# Patient Record
Sex: Female | Born: 1945
Health system: Southern US, Community
[De-identification: ages and names within clinical notes are randomized; demographics above are authoritative.]

## PROBLEM LIST (undated history)

## (undated) DIAGNOSIS — B351 Tinea unguium: Secondary | ICD-10-CM

## (undated) DIAGNOSIS — K635 Polyp of colon: Secondary | ICD-10-CM

## (undated) DIAGNOSIS — K5790 Diverticulosis of intestine, part unspecified, without perforation or abscess without bleeding: Secondary | ICD-10-CM

## (undated) DIAGNOSIS — D649 Anemia, unspecified: Secondary | ICD-10-CM

## (undated) DIAGNOSIS — E785 Hyperlipidemia, unspecified: Secondary | ICD-10-CM

## (undated) DIAGNOSIS — I10 Essential (primary) hypertension: Secondary | ICD-10-CM

## (undated) HISTORY — PX: POLYPECTOMY: SHX149

## (undated) HISTORY — DX: Hyperlipidemia, unspecified: E78.5

## (undated) HISTORY — DX: Tinea unguium: B35.1

## (undated) HISTORY — DX: Anemia, unspecified: D64.9

## (undated) HISTORY — PX: COLONOSCOPY: SHX174

## (undated) HISTORY — DX: Essential (primary) hypertension: I10

## (undated) HISTORY — DX: Polyp of colon: K63.5

## (undated) HISTORY — PX: CARPAL TUNNEL RELEASE: SHX101

## (undated) HISTORY — PX: ABDOMINAL HYSTERECTOMY: SHX81

## (undated) HISTORY — DX: Diverticulosis of intestine, part unspecified, without perforation or abscess without bleeding: K57.90

---

## 2001-01-04 ENCOUNTER — Emergency Department (HOSPITAL_COMMUNITY): Admission: EM | Admit: 2001-01-04 | Discharge: 2001-01-04 | Payer: Self-pay | Admitting: Emergency Medicine

## 2001-01-04 ENCOUNTER — Encounter: Payer: Self-pay | Admitting: Emergency Medicine

## 2002-07-07 ENCOUNTER — Other Ambulatory Visit: Admission: RE | Admit: 2002-07-07 | Discharge: 2002-07-07 | Payer: Self-pay | Admitting: Family Medicine

## 2004-05-30 ENCOUNTER — Encounter: Admission: RE | Admit: 2004-05-30 | Discharge: 2004-05-30 | Payer: Self-pay | Admitting: Internal Medicine

## 2006-07-30 ENCOUNTER — Emergency Department (HOSPITAL_COMMUNITY): Admission: EM | Admit: 2006-07-30 | Discharge: 2006-07-30 | Payer: Self-pay | Admitting: Family Medicine

## 2008-07-04 ENCOUNTER — Other Ambulatory Visit: Admission: RE | Admit: 2008-07-04 | Discharge: 2008-07-04 | Payer: Self-pay | Admitting: Family Medicine

## 2008-07-04 ENCOUNTER — Ambulatory Visit: Payer: Self-pay | Admitting: Family Medicine

## 2008-07-04 LAB — HM PAP SMEAR: HM Pap smear: NEGATIVE

## 2009-02-17 ENCOUNTER — Ambulatory Visit: Payer: Self-pay | Admitting: Family Medicine

## 2009-03-03 ENCOUNTER — Ambulatory Visit: Payer: Self-pay | Admitting: Family Medicine

## 2009-06-08 ENCOUNTER — Ambulatory Visit: Payer: Self-pay | Admitting: Family Medicine

## 2009-06-14 LAB — HM COLONOSCOPY

## 2009-07-06 LAB — HM COLONOSCOPY

## 2009-09-06 ENCOUNTER — Ambulatory Visit: Payer: Self-pay | Admitting: Family Medicine

## 2010-01-08 ENCOUNTER — Ambulatory Visit: Payer: Self-pay | Admitting: Family Medicine

## 2010-01-10 ENCOUNTER — Ambulatory Visit: Payer: Self-pay | Admitting: Family Medicine

## 2010-05-02 ENCOUNTER — Ambulatory Visit: Payer: Self-pay | Admitting: Family Medicine

## 2010-05-20 ENCOUNTER — Emergency Department (HOSPITAL_COMMUNITY)
Admission: EM | Admit: 2010-05-20 | Discharge: 2010-05-20 | Payer: Self-pay | Source: Home / Self Care | Admitting: Emergency Medicine

## 2010-09-04 LAB — HM MAMMOGRAPHY

## 2010-11-12 ENCOUNTER — Encounter: Payer: Self-pay | Admitting: Family Medicine

## 2010-11-27 ENCOUNTER — Encounter: Payer: Self-pay | Admitting: Family Medicine

## 2010-11-28 ENCOUNTER — Ambulatory Visit (INDEPENDENT_AMBULATORY_CARE_PROVIDER_SITE_OTHER): Payer: Medicare Other | Admitting: Family Medicine

## 2010-11-28 ENCOUNTER — Other Ambulatory Visit: Payer: Self-pay | Admitting: *Deleted

## 2010-11-28 ENCOUNTER — Encounter: Payer: Self-pay | Admitting: Family Medicine

## 2010-11-28 DIAGNOSIS — I10 Essential (primary) hypertension: Secondary | ICD-10-CM

## 2010-11-28 DIAGNOSIS — Z23 Encounter for immunization: Secondary | ICD-10-CM

## 2010-11-28 DIAGNOSIS — R5381 Other malaise: Secondary | ICD-10-CM

## 2010-11-28 DIAGNOSIS — E7800 Pure hypercholesterolemia, unspecified: Secondary | ICD-10-CM

## 2010-11-28 DIAGNOSIS — R5383 Other fatigue: Secondary | ICD-10-CM

## 2010-11-28 LAB — COMPREHENSIVE METABOLIC PANEL
ALT: 16 U/L (ref 0–35)
AST: 20 U/L (ref 0–37)
Albumin: 4.3 g/dL (ref 3.5–5.2)
Alkaline Phosphatase: 69 U/L (ref 39–117)
BUN: 24 mg/dL — ABNORMAL HIGH (ref 6–23)
CO2: 27 mEq/L (ref 19–32)
Calcium: 9.6 mg/dL (ref 8.4–10.5)
Chloride: 101 mEq/L (ref 96–112)
Creat: 0.97 mg/dL (ref 0.50–1.10)
Glucose, Bld: 85 mg/dL (ref 70–99)
Potassium: 3.7 mEq/L (ref 3.5–5.3)
Sodium: 140 mEq/L (ref 135–145)
Total Bilirubin: 0.8 mg/dL (ref 0.3–1.2)
Total Protein: 7.1 g/dL (ref 6.0–8.3)

## 2010-11-28 LAB — CBC WITH DIFFERENTIAL/PLATELET
Basophils Absolute: 0 10*3/uL (ref 0.0–0.1)
Basophils Relative: 0 % (ref 0–1)
Eosinophils Absolute: 0 10*3/uL (ref 0.0–0.7)
Eosinophils Relative: 0 % (ref 0–5)
HCT: 37.5 % (ref 36.0–46.0)
Hemoglobin: 12.5 g/dL (ref 12.0–15.0)
Lymphocytes Relative: 36 % (ref 12–46)
Lymphs Abs: 1.9 10*3/uL (ref 0.7–4.0)
MCH: 28.2 pg (ref 26.0–34.0)
MCHC: 33.3 g/dL (ref 30.0–36.0)
MCV: 84.7 fL (ref 78.0–100.0)
Monocytes Absolute: 0.3 10*3/uL (ref 0.1–1.0)
Monocytes Relative: 6 % (ref 3–12)
Neutro Abs: 3.1 10*3/uL (ref 1.7–7.7)
Neutrophils Relative %: 59 % (ref 43–77)
Platelets: 221 10*3/uL (ref 150–400)
RBC: 4.43 MIL/uL (ref 3.87–5.11)
RDW: 15 % (ref 11.5–15.5)
WBC: 5.3 10*3/uL (ref 4.0–10.5)

## 2010-11-28 LAB — TSH: TSH: 1.824 u[IU]/mL (ref 0.350–4.500)

## 2010-11-28 LAB — LIPID PANEL
Cholesterol: 214 mg/dL — ABNORMAL HIGH (ref 0–200)
HDL: 50 mg/dL (ref >39)
LDL Cholesterol: 136 mg/dL — ABNORMAL HIGH (ref 0–99)
Total CHOL/HDL Ratio: 4.3 Ratio
Triglycerides: 142 mg/dL (ref <150)
VLDL: 28 mg/dL (ref 0–40)

## 2010-11-28 MED ORDER — LISINOPRIL-HYDROCHLOROTHIAZIDE 20-12.5 MG PO TABS: 1.0000 | ORAL_TABLET | Freq: Every day | ORAL | Status: DC

## 2010-11-28 MED ORDER — ATENOLOL 25 MG PO TABS: 25.0000 mg | ORAL_TABLET | Freq: Every day | ORAL | Status: DC

## 2010-11-28 MED ORDER — PRAVASTATIN SODIUM 40 MG PO TABS: 40.0000 mg | ORAL_TABLET | Freq: Every day | ORAL | Status: DC

## 2010-11-28 NOTE — Progress Notes (Signed)
Patient presents for a med check and for fasting labs. Hypertension follow-up:  Blood pressures elsewhere are "good"--checked recently once, doesn't recall the number.  Denies dizziness, headaches, chest pain.  Denies side effects of medications. She has been off the atenolol since last week when she ran out.  Hyperlipidemia follow-up:  Patient is reportedly trying to following a low-fat, low cholesterol diet--admits to not following it exactly.  Compliant with medications and denies medication side effects.  Past Medical History  Diagnosis Date  . Hypertension   . Diverticulosis   . Hemorrhoids   . Anemia   . Hyperlipidemia   . Colon polyp     colonoscopy 06/2009, due again 2016    Past Surgical History  Procedure Date  . Abdominal hysterectomy     History   Social History  . Marital Status: Married    Spouse Name: N/A    Number of Children: 4  . Years of Education: N/A   Occupational History  . MACHINE OPERATOR--Retired     Social History Main Topics  . Smoking status: Former Smoker    Quit date: 05/13/2005  . Smokeless tobacco: Not on file  . Alcohol Use: No  . Drug Use: No  . Sexually Active: Not on file   Other Topics Concern  . Not on file   Social History Narrative   Retired 2011.  Lives with husband. Has 4 children    Family History  Problem Relation Age of Onset  . Hypertension Mother   . Cancer Father     colon (60's)  . Cancer Sister     throat  . Hypertension Brother   . Diabetes Neg Hx   . Hypertension Brother     Current outpatient prescriptions:fish oil-omega-3 fatty acids 1000 MG capsule, Take 2 g by mouth daily.  , Disp: , Rfl: ;  lisinopril-hydrochlorothiazide (PRINZIDE,ZESTORETIC) 20-12.5 MG per tablet, Take 1 tablet by mouth daily., Disp: 90 tablet, Rfl: 1;  DISCONTD: lisinopril-hydrochlorothiazide (PRINZIDE,ZESTORETIC) 20-12.5 MG per tablet, Take 1 tablet by mouth daily.  , Disp: , Rfl:  aspirin 81 MG tablet, Take 81 mg by mouth daily.   , Disp: , Rfl: ;  atenolol (TENORMIN) 25 MG tablet, Take 1 tablet (25 mg total) by mouth daily., Disp: 90 tablet, Rfl: 1;  pravastatin (PRAVACHOL) 40 MG tablet, Take 1 tablet (40 mg total) by mouth daily., Disp: 90 tablet, Rfl: 1;  DISCONTD: atenolol (TENORMIN) 25 MG tablet, Take 25 mg by mouth daily.  , Disp: , Rfl:  DISCONTD: pravastatin (PRAVACHOL) 40 MG tablet, Take 40 mg by mouth daily.  , Disp: , Rfl:   No Known Allergies  Per chart, last tetanus 2007.  Never had pneumonia vaccine. Had Shingles vaccine in 2011  ROS:  [facility]  Denies chest pain, palpitations, SOB, cough, fevers, URI symptoms, abdominal pain or blood in stool, no urinary complaints +edema at her ankles at the end of the day only  PHYSICAL EXAM: BP 142/84  Pulse 72  Ht 5' 1.5" (1.562 m)  Wt 199 lb (90.266 kg)  BMI 36.99 kg/m2 Well developed, pleasant female in no distress Neck: no lymphadenopathy or thyromegaly Heart: regular rate and rhythm without murmurs Lungs: clear bilaterally Abdomen: soft, nontender, nondistended, no abdominal bruits, no organomegaly or masses Extremities: no clubbing, cyanosis or edema, normal pulses Skin: no rash Psych: normal mood, affect, hygiene, grooming, speech and eye contact  ASSESSMENT/PLAN: 1. Essential hypertension, benign  Comprehensive metabolic panel, atenolol (TENORMIN) 25 MG  tablet, lisinopril-hydrochlorothiazide (PRINZIDE,ZESTORETIC) 20-12.5 MG per tablet  2. Pure hypercholesterolemia  Comprehensive metabolic panel, Lipid panel, pravastatin (PRAVACHOL) 40 MG tablet  3. Fatigue  Vitamin D 25 hydroxy, CBC with Differential, TSH  4. Need for pneumococcal vaccination  Pneumococcal polysaccharide vaccine 23-valent greater than or equal to 2yo subcutaneous/IM    If lipids not at goal, recommend rechecking in 3 months--patient plans to make additional dietary changes to lower cholesterol, and also she has been off the medication for about a week prior to  todays labs.

## 2010-11-29 ENCOUNTER — Other Ambulatory Visit: Payer: Self-pay | Admitting: *Deleted

## 2010-11-29 ENCOUNTER — Telehealth: Payer: Self-pay | Admitting: *Deleted

## 2010-11-29 DIAGNOSIS — E559 Vitamin D deficiency, unspecified: Secondary | ICD-10-CM

## 2010-11-29 DIAGNOSIS — E7800 Pure hypercholesterolemia, unspecified: Secondary | ICD-10-CM

## 2010-11-29 LAB — VITAMIN D 25 HYDROXY (VIT D DEFICIENCY, FRACTURES): Vit D, 25-Hydroxy: 28 ng/mL — ABNORMAL LOW (ref 30–89)

## 2010-11-29 NOTE — Telephone Encounter (Signed)
Left message for patient to return my call to go over labs. 

## 2011-03-04 ENCOUNTER — Other Ambulatory Visit: Payer: Medicare Other

## 2011-05-20 ENCOUNTER — Other Ambulatory Visit: Payer: Self-pay | Admitting: Family Medicine

## 2011-06-10 ENCOUNTER — Encounter: Payer: Self-pay | Admitting: Internal Medicine

## 2011-06-20 ENCOUNTER — Encounter: Payer: Self-pay | Admitting: Family Medicine

## 2011-06-20 ENCOUNTER — Ambulatory Visit (INDEPENDENT_AMBULATORY_CARE_PROVIDER_SITE_OTHER): Payer: Medicare Other | Admitting: Family Medicine

## 2011-06-20 VITALS — BP 138/88 | HR 76 | Ht 62.0 in | Wt 201.0 lb

## 2011-06-20 DIAGNOSIS — E559 Vitamin D deficiency, unspecified: Secondary | ICD-10-CM

## 2011-06-20 DIAGNOSIS — Z79899 Other long term (current) drug therapy: Secondary | ICD-10-CM

## 2011-06-20 DIAGNOSIS — Z0000 Encounter for general adult medical examination without abnormal findings: Secondary | ICD-10-CM

## 2011-06-20 DIAGNOSIS — G5602 Carpal tunnel syndrome, left upper limb: Secondary | ICD-10-CM

## 2011-06-20 DIAGNOSIS — I10 Essential (primary) hypertension: Secondary | ICD-10-CM

## 2011-06-20 DIAGNOSIS — G5600 Carpal tunnel syndrome, unspecified upper limb: Secondary | ICD-10-CM

## 2011-06-20 DIAGNOSIS — Z23 Encounter for immunization: Secondary | ICD-10-CM

## 2011-06-20 DIAGNOSIS — E7800 Pure hypercholesterolemia, unspecified: Secondary | ICD-10-CM

## 2011-06-20 LAB — POCT URINALYSIS DIPSTICK
Bilirubin, UA: NEGATIVE
Blood, UA: NEGATIVE
Glucose, UA: NEGATIVE
Ketones, UA: NEGATIVE
Leukocytes, UA: NEGATIVE
Nitrite, UA: NEGATIVE
Protein, UA: NEGATIVE
Spec Grav, UA: 1.015
Urobilinogen, UA: NEGATIVE
pH, UA: 5

## 2011-06-20 LAB — HEPATIC FUNCTION PANEL
ALT: 11 U/L (ref 0–35)
AST: 15 U/L (ref 0–37)
Albumin: 4.3 g/dL (ref 3.5–5.2)
Alkaline Phosphatase: 78 U/L (ref 39–117)
Bilirubin, Direct: 0.1 mg/dL (ref 0.0–0.3)
Indirect Bilirubin: 0.3 mg/dL (ref 0.0–0.9)
Total Bilirubin: 0.4 mg/dL (ref 0.3–1.2)
Total Protein: 6.9 g/dL (ref 6.0–8.3)

## 2011-06-20 LAB — LIPID PANEL
Cholesterol: 187 mg/dL (ref 0–200)
HDL: 45 mg/dL (ref >39)
LDL Cholesterol: 117 mg/dL — ABNORMAL HIGH (ref 0–99)
Total CHOL/HDL Ratio: 4.2 Ratio
Triglycerides: 127 mg/dL (ref <150)
VLDL: 25 mg/dL (ref 0–40)

## 2011-06-20 NOTE — Progress Notes (Signed)
Sylvia Gutierrez is a 66 y.o. female who presents for a complete physical.  She has the following concerns: Pt states that she has had some phlegm in her chest that she constantly has to cough up. Cough x 2 months.  Started with a cold, but left with persistent chest congestion and cough.  Phlegm is sometimes clear, sometimes green/dark.  Denies fevers, shortness of breath, chest tightness.  Complains of tingling in hands, L>R.  Had EMG studies done, ordered at 04/2010 visit.  No records/results in chart.  Denies weakness.  Sometimes there is L wrist pain, but wearing the wrist brace helps, recently restarted wearing.  Med check: Hypertension follow-Not checking BP elsewhere. Denies dizziness, headaches, chest pain. Denies side effects of medications.   Hyperlipidemia follow-up: Patient is reportedly trying to following a low-fat, low cholesterol diet. Compliant with medications and denies medication side effects.  She never returned in October for labs (lipids and Vitamin D)  Health Maintenance: Immunization History  Administered Date(s) Administered  . Pneumococcal Polysaccharide 11/28/2010  . Td 05/13/2005  . Zoster 01/10/2010  tetanus was through a previous job.  Unable to get records to know which type of tetanus vaccine she received. 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 Hypertension   . Diverticulosis   . Hemorrhoids   . Anemia   . Hyperlipidemia   . Colon polyp     colonoscopy 06/2009, due again 2016    Past Surgical History  Procedure Date  . Abdominal hysterectomy     for bleeding/benign  . Carpal tunnel release     right    History   Social History  .  Marital Status: Married    Spouse Name: N/A    Number of Children: 4  . Years of Education: N/A   Occupational History  . MACHINE OPERATOR--Retired     Social History Main Topics  . Smoking status: Former Smoker    Quit date: 05/13/2005  . Smokeless tobacco: Never Used  . Alcohol Use: No  . Drug Use: No  . Sexually Active: Not Currently   Other Topics Concern  . Not on file   Social History Narrative   Retired 2011.  Lives with husband. Has 4 children, all in [place].  7 grandchildren, 1 great grandchild    Family History  Problem Relation Age of Onset  . Hypertension Mother   . Cancer Father     colon (60's)  . Cancer Sister     throat  . Hypertension Brother   . Diabetes Neg Hx   . Hypertension Brother     Current outpatient prescriptions:aspirin 81 MG tablet, Take 81 mg by mouth daily.  , Disp: , Rfl: ;  atenolol (TENORMIN) 25 MG tablet, TAKE ONE TABLET BY MOUTH EVERY DAY, Disp: 90 tablet, Rfl: 0;  cholecalciferol (VITAMIN D) 1000 UNITS tablet, Take 1,000 Units by mouth daily., Disp: , Rfl: ;  fish oil-omega-3 fatty acids 1000 MG capsule, Take 2 g by mouth daily.  , Disp: , Rfl:  lisinopril-hydrochlorothiazide (PRINZIDE,ZESTORETIC) 20-12.5 MG per tablet, TAKE ONE TABLET BY MOUTH EVERY DAY, Disp: 90 tablet, Rfl: 0;  pravastatin (PRAVACHOL) 40 MG tablet, TAKE ONE TABLET BY MOUTH  EVERY DAY, Disp: 90 tablet, Rfl: 0  No Known Allergies  ROS: The patient denies anorexia, fever, weight changes, headaches,  vision changes, decreased hearing, ear pain, sore throat, breast concerns, chest pain, palpitations, dizziness, syncope, dyspnea on exertion, swelling, nausea, vomiting, diarrhea, constipation, abdominal pain, melena, hematochezia, indigestion/heartburn, hematuria, incontinence, dysuria, vaginal bleeding, discharge, odor or itch, genital lesions, weakness, tremor, suspicious skin lesions, depression, anxiety, abnormal bleeding/bruising, or enlarged lymph nodes. + L knee pain  x 6 years, has seen ortho and was told she has arthritis. Doesn't take any pain meds, uses topical creams.  PHYSICAL EXAM: BP 138/88  Pulse 76  Ht 5\' 2"  (1.575 m)  Wt 201 lb (91.173 kg)  BMI 36.76 kg/m2 BP 174/90 R arm by MD, but had tickle in throat with frequent cough prior to re-check General Appearance:    Alert, cooperative, no distress, appears stated age  Head:    Normocephalic, without obvious abnormality, atraumatic  Eyes:    PERRL, conjunctiva/corneas clear, EOM's intact, fundi    benign  Ears:    Normal TM's and external ear canals  Nose:   Nares normal, mucosa moderately edematous, no drainage or sinus tenderness  Throat:   Lips, mucosa, and tongue normal; upper dentures  Neck:   Supple, no lymphadenopathy;  thyroid:  no   enlargement/tenderness/nodules; no carotid   bruit or JVD  Back:    Spine nontender, no curvature, ROM normal, no CVA     tenderness  Lungs:     Clear to auscultation bilaterally without wheezes, rales or     ronchi; respirations unlabored  Chest Wall:    No tenderness or deformity   Heart:    Regular rate and rhythm, S1 and S2 normal, no murmur, rub   or gallop  Breast Exam:    No tenderness, masses, or nipple discharge or inversion.      No axillary lymphadenopathy  Abdomen:     Soft, non-tender, nondistended, normoactive bowel sounds,    no masses, no hepatosplenomegaly  Genitalia:    Normal external genitalia without lesions.  BUS and vagina normal; uterus surgical absent.  No adnexal masses or tenderness.  Pap not performed  Rectal:    Normal tone, no masses or tenderness; guaiac negative stool  Extremities:   No clubbing, cyanosis or edema. Thickened onychomycotic great toenails bilaterally, not ingrowing  Pulses:   2+ and symmetric all extremities  Skin:   Skin color, texture, turgor normal, no rashes or lesions  Lymph nodes:   Cervical, supraclavicular, and axillary nodes normal  Neurologic:   CNII-XII intact, normal strength, sensation and gait;  reflexes 2+ and symmetric throughout          Psych:   Normal mood, affect, hygiene and grooming.     ASSESSMENT/PLAN: 1. Routine general medical examination at a health care facility  Visual acuity screening, POCT Urinalysis Dipstick  2. Essential hypertension, benign    3. Pure hypercholesterolemia  Lipid Profile, Hepatic function panel  4. Carpal tunnel syndrome of left wrist    5. Unspecified vitamin D deficiency  Vitamin D (25 hydroxy)  6. Encounter for long-term (current) use of other medications  Hepatic function panel  7. Need for Tdap vaccination  Tdap vaccine greater than or equal to 7yo IM   Knee arthritis--trial of glucosamine and chondroitin. Daily exercise, weight loss encouraged.  HTN--BP elevated today (coughing some, much higher after coughing).  Monitor blood pressure elsewhere, write down.  Follow low sodium diet.  Exercise daily and  try and lose weight.  F/u here in 1-2 months with list of blood pressure.  L carpal tunnel syndrome.  Continue use of wrist brace.  Follow up here if worsening pain, or weakness develops. Will try and get results of EMG from last year.  Cough--since phlegm is mostly clear, ABX aren't needed. Cough is from PND.  Recommend Mucinex (plain or DM) to help with cough.  Follow up if phlegm becomes discolored, fever, worsening cough, shortness of breath.  Discussed monthly self breast exams and yearly mammograms; at least 30 minutes of aerobic activity at least 5 days/week; proper sunscreen use reviewed; healthy diet, including goals of calcium and vitamin D intake and alcohol recommendations (less than or equal to 1 drink/day) reviewed; regular seatbelt use; changing batteries in smoke detectors.  Immunization recommendations discussed--TdaP given today.  Declines flu shot, recommended yearly.  Colonoscopy recommendations reviewed--due again 2016  Schedule dentist and eye exams

## 2011-06-20 NOTE — Patient Instructions (Addendum)
HEALTH MAINTENANCE RECOMMENDATIONS:  It is recommended that you get at least 30 minutes of aerobic exercise at least 5 days/week (for weight loss, you may need as much as 60-90 minutes). This can be any activity that gets your heart rate up. This can be divided in 10-15 minute intervals if needed, but try and build up your endurance at least once a week.  Weight bearing exercise is also recommended twice weekly.  Eat a healthy diet with lots of vegetables, fruits and fiber.  "Colorful" foods have a lot of vitamins (ie green vegetables, tomatoes, red peppers, etc).  Limit sweet tea, regular sodas and alcoholic beverages, all of which has a lot of calories and sugar.  Up to 1 alcoholic drink daily may be beneficial for women (unless trying to lose weight, watch sugars).  Drink a lot of water.  Calcium recommendations are 1200-1500 mg daily (1500 mg for postmenopausal women or women without ovaries), and vitamin D 1000 IU daily.  This should be obtained from diet and/or supplements (vitamins), and calcium should not be taken all at once, but in divided doses.  Monthly self breast exams and yearly mammograms for women over the age of 11 is recommended.  Sunscreen of at least SPF 30 should be used on all sun-exposed parts of the skin when outside between the hours of 10 am and 4 pm (not just when at beach or pool, but even with exercise, golf, tennis, and yard work!)  Use a sunscreen that says "broad spectrum" so it covers both UVA and UVB rays, and make sure to reapply every 1-2 hours.  Remember to change the batteries in your smoke detectors when changing your clock times in the spring and fall.  Use your seat belt every time you are in a car, and please drive safely and not be distracted with cell phones and texting while driving.  Please schedule dental and eye exams. Try glucosamine and chondroitin supplement to help with your knee arthritis pain.  Follow up with orthopedist if worsening pain, if  knee is giving out or locking.  Cough--since phlegm is mostly clear, antibiotics aren't needed. Cough is from postnasal drainage.  I recommend Mucinex (plain or DM) to help with cough.  Follow up if phlegm becomes discolored, fever, worsening cough, shortness of breath.  BP elevated today.  Monitor blood pressure elsewhere, write down.  Follow low sodium diet.  Exercise daily and try and lose weight.  F/u here in 1-2 months with list of blood pressure. Goal BP <130/80 (under 135/85 okay)  2 Gram Low Sodium Diet A 2 gram sodium diet restricts the amount of sodium in the diet to no more than 2 g or 2000 mg daily. Limiting the amount of sodium is often used to help lower blood pressure. It is important if you have heart, liver, or kidney problems. Many foods contain sodium for flavor and sometimes as a preservative. When the amount of sodium in a diet needs to be low, it is important to know what to look for when choosing foods and drinks. The following includes some information and guidelines to help make it easier for you to adapt to a low sodium diet. QUICK TIPS  Do not add salt to food.   Avoid convenience items and fast food.   Choose unsalted snack foods.   Buy lower sodium products, often labeled as "lower sodium" or "no salt added."   Check food labels to learn how much sodium is in 1 serving.  When eating at a restaurant, ask that your food be prepared with less salt or none, if possible.  READING FOOD LABELS FOR SODIUM INFORMATION The nutrition facts label is a good place to find how much sodium is in foods. Look for products with no more than 500 to 600 mg of sodium per meal and no more than 150 mg per serving. Remember that 2 g = 2000 mg. The food label may also list foods as:  Sodium-free: Less than 5 mg in a serving.   Very low sodium: 35 mg or less in a serving.   Low-sodium: 140 mg or less in a serving.   Light in sodium: 50% less sodium in a serving. For example, if a  food that usually has 300 mg of sodium is changed to become light in sodium, it will have 150 mg of sodium.   Reduced sodium: 25% less sodium in a serving. For example, if a food that usually has 400 mg of sodium is changed to reduced sodium, it will have 300 mg of sodium.  CHOOSING FOODS Grains  Avoid: Salted crackers and snack items. Some cereals, including instant hot cereals. Bread stuffing and biscuit mixes. Seasoned rice or pasta mixes.   Choose: Unsalted snack items. Low-sodium cereals, oats, puffed wheat and rice, shredded wheat. English muffins and bread. Pasta.  Meats  Avoid: Salted, canned, smoked, spiced, pickled meats, including fish and poultry. Bacon, ham, sausage, cold cuts, hot dogs, anchovies.   Choose: Low-sodium canned tuna and salmon. Fresh or frozen meat, poultry, and fish.  Dairy  Avoid: Processed cheese and spreads. Cottage cheese. Buttermilk and condensed milk. Regular cheese.   Choose: Milk. Low-sodium cottage cheese. Yogurt. Sour cream. Low-sodium cheese.  Fruits and Vegetables  Avoid: Regular canned vegetables. Regular canned tomato sauce and paste. Frozen vegetables in sauces. Olives. Rosita Fire. Relishes. Sauerkraut.   Choose: Low-sodium canned vegetables. Low-sodium tomato sauce and paste. Frozen or fresh vegetables. Fresh and frozen fruit.  Condiments  Avoid: Canned and packaged gravies. Worcestershire sauce. Tartar sauce. Barbecue sauce. Soy sauce. Steak sauce. Ketchup. Onion, garlic, and table salt. Meat flavorings and tenderizers.   Choose: Fresh and dried herbs and spices. Low-sodium varieties of mustard and ketchup. Lemon juice. Tabasco sauce. Horseradish.  SAMPLE 2 GRAM SODIUM MEAL PLAN Breakfast / Sodium (mg)  1 cup low-fat milk / 143 mg   2 slices whole-wheat toast / 270 mg   1 tbs heart-healthy margarine / 153 mg   1 hard-boiled egg / 139 mg   1 small orange / 0 mg  Lunch / Sodium (mg)  1 cup raw carrots / 76 mg    cup hummus / 298  mg   1 cup low-fat milk / 143 mg    cup red grapes / 2 mg   1 whole-wheat pita bread / 356 mg  Dinner / Sodium (mg)  1 cup whole-wheat pasta / 2 mg   1 cup low-sodium tomato sauce / 73 mg   3 oz lean ground beef / 57 mg   1 small side salad (1 cup raw spinach leaves,  cup cucumber,  cup yellow bell pepper) with 1 tsp olive oil and 1 tsp red wine vinegar / 25 mg  Snack / Sodium (mg)  1 container low-fat vanilla yogurt / 107 mg   3 graham cracker squares / 127 mg  Nutrient Analysis  Calories: 2033   Protein: 77 g   Carbohydrate: 282 g   Fat: 72 g   Sodium: 1971  mg  Document Released: 04/29/2005 Document Revised: 01/09/2011 Document Reviewed: 07/31/2009 [facility] Patient Information 2012 [place], [place].

## 2011-06-21 ENCOUNTER — Encounter: Payer: Self-pay | Admitting: Family Medicine

## 2011-06-21 LAB — VITAMIN D 25 HYDROXY (VIT D DEFICIENCY, FRACTURES): Vit D, 25-Hydroxy: 51 ng/mL (ref 30–89)

## 2011-08-22 ENCOUNTER — Ambulatory Visit (INDEPENDENT_AMBULATORY_CARE_PROVIDER_SITE_OTHER): Payer: Medicare Other | Admitting: Family Medicine

## 2011-08-22 ENCOUNTER — Encounter: Payer: Self-pay | Admitting: Family Medicine

## 2011-08-22 VITALS — BP 126/72 | HR 64 | Ht 62.0 in | Wt 198.0 lb

## 2011-08-22 DIAGNOSIS — E7800 Pure hypercholesterolemia, unspecified: Secondary | ICD-10-CM

## 2011-08-22 DIAGNOSIS — I10 Essential (primary) hypertension: Secondary | ICD-10-CM

## 2011-08-22 MED ORDER — PRAVASTATIN SODIUM 40 MG PO TABS: 40.0000 mg | ORAL_TABLET | Freq: Every day | ORAL | Status: DC

## 2011-08-22 MED ORDER — ATENOLOL 25 MG PO TABS: 25.0000 mg | ORAL_TABLET | Freq: Every day | ORAL | Status: DC

## 2011-08-22 MED ORDER — LISINOPRIL-HYDROCHLOROTHIAZIDE 20-12.5 MG PO TABS: 1.0000 | ORAL_TABLET | Freq: Every day | ORAL | Status: DC

## 2011-08-22 NOTE — Progress Notes (Signed)
Patient presents for follow up on hypertension.  At her recent physical, was noted to have elevated blood pressure.  She also had postnasal drip and had been coughing.  Used some mucinex and the cough resolved.  No further problems with cough.  She increased her exercise, now going to the gym 5 days/week.  Continues to follow low sodium diet.  She hasn't remembered to check her blood pressure elsewhere since the last visit.  Denies headaches, dizziness, palpitations, chest pain.  Hasn't been having any L knee pain since she has been exercising more regularly.  Past Medical History  Diagnosis Date  . Hypertension   . Diverticulosis   . Hemorrhoids   . Anemia   . Hyperlipidemia   . Colon polyp     colonoscopy 06/2009, due again 2016    Past Surgical History  Procedure Date  . Abdominal hysterectomy     for bleeding/benign  . Carpal tunnel release     right    History   Social History  . Marital Status: Married    Spouse Name: N/A    Number of Children: 4  . Years of Education: N/A   Occupational History  . MACHINE OPERATOR--Retired     Social History Main Topics  . Smoking status: Former Smoker    Quit date: 05/13/2005  . Smokeless tobacco: Never Used  . Alcohol Use: No  . Drug Use: No  . Sexually Active: Not Currently   Other Topics Concern  . Not on file   Social History Narrative   Retired 2011.  Lives with husband. Has 4 children, all in [place].  7 grandchildren, 1 great grandchild    Family History  Problem Relation Age of Onset  . Hypertension Mother   . Cancer Father     colon (60's)  . Cancer Sister     throat  . Hypertension Brother   . Diabetes Neg Hx   . Hypertension Brother    Current Outpatient Prescriptions on File Prior to Visit  Medication Sig Dispense Refill  . aspirin 81 MG tablet Take 81 mg by mouth daily.        . cholecalciferol (VITAMIN D) 1000 UNITS tablet Take 1,000 Units by mouth daily.      . fish oil-omega-3 fatty acids 1000  MG capsule Take 2 g by mouth daily.        Marland Kitchen DISCONTD: atenolol (TENORMIN) 25 MG tablet TAKE ONE TABLET BY MOUTH EVERY DAY  90 tablet  0  . DISCONTD: lisinopril-hydrochlorothiazide (PRINZIDE,ZESTORETIC) 20-12.5 MG per tablet TAKE ONE TABLET BY MOUTH EVERY DAY  90 tablet  0  . DISCONTD: pravastatin (PRAVACHOL) 40 MG tablet TAKE ONE TABLET BY MOUTH EVERY DAY  90 tablet  0   No Known Allergies  ROS:  Denies fevers, headaches, dizziness, chest pain, palpitations, swelling in feet, GI complaints, skin rashes or other problems. Denies joint pains  PHYSICAL EXAM: BP 126/72  Pulse 64  Ht 5\' 2"  (1.575 m)  Wt 198 lb (89.812 kg)  BMI 36.21 kg/m2 Well developed, pleasant female in no distress Neck: no lymphadenopathy or mass Heart: regular rate and rhythm without murmur Lungs: clear bilaterally Extremities: no edema Skin: no rash Psych: normal mood, affect  ASSESSMENT/PLAN: 1. Essential hypertension, benign  lisinopril-hydrochlorothiazide (PRINZIDE,ZESTORETIC) 20-12.5 MG per tablet, atenolol (TENORMIN) 25 MG tablet  2. Pure hypercholesterolemia  pravastatin (PRAVACHOL) 40 MG tablet    F/u in September for fasting med check

## 2011-08-22 NOTE — Patient Instructions (Signed)
Your blood pressure is excellent today.  Continue your current medications and regular exercise. Return for a fasting visit in September to re-check your bloodwork and your blood pressure

## 2011-09-05 LAB — HM MAMMOGRAPHY: HM Mammogram: NEGATIVE

## 2011-11-20 ENCOUNTER — Encounter: Payer: Self-pay | Admitting: Gastroenterology

## 2012-01-22 ENCOUNTER — Ambulatory Visit (INDEPENDENT_AMBULATORY_CARE_PROVIDER_SITE_OTHER): Payer: Medicare Other | Admitting: Family Medicine

## 2012-01-22 ENCOUNTER — Encounter: Payer: Self-pay | Admitting: Family Medicine

## 2012-01-22 VITALS — BP 128/78 | HR 68 | Ht 62.0 in | Wt 195.0 lb

## 2012-01-22 DIAGNOSIS — I10 Essential (primary) hypertension: Secondary | ICD-10-CM

## 2012-01-22 DIAGNOSIS — E7800 Pure hypercholesterolemia, unspecified: Secondary | ICD-10-CM

## 2012-01-22 LAB — COMPREHENSIVE METABOLIC PANEL
ALT: 12 U/L (ref 0–35)
AST: 18 U/L (ref 0–37)
Albumin: 4.4 g/dL (ref 3.5–5.2)
Alkaline Phosphatase: 65 U/L (ref 39–117)
BUN: 25 mg/dL — ABNORMAL HIGH (ref 6–23)
CO2: 28 mEq/L (ref 19–32)
Calcium: 9.6 mg/dL (ref 8.4–10.5)
Chloride: 103 mEq/L (ref 96–112)
Creat: 0.97 mg/dL (ref 0.50–1.10)
Glucose, Bld: 99 mg/dL (ref 70–99)
Potassium: 3.9 mEq/L (ref 3.5–5.3)
Sodium: 140 mEq/L (ref 135–145)
Total Bilirubin: 0.7 mg/dL (ref 0.3–1.2)
Total Protein: 7 g/dL (ref 6.0–8.3)

## 2012-01-22 LAB — LIPID PANEL
Cholesterol: 180 mg/dL (ref 0–200)
HDL: 46 mg/dL (ref >39)
LDL Cholesterol: 108 mg/dL — ABNORMAL HIGH (ref 0–99)
Total CHOL/HDL Ratio: 3.9 Ratio
Triglycerides: 131 mg/dL (ref <150)
VLDL: 26 mg/dL (ref 0–40)

## 2012-01-22 MED ORDER — ATENOLOL 25 MG PO TABS: 25.0000 mg | ORAL_TABLET | Freq: Every day | ORAL | Status: DC

## 2012-01-22 MED ORDER — LISINOPRIL-HYDROCHLOROTHIAZIDE 20-12.5 MG PO TABS: 1.0000 | ORAL_TABLET | Freq: Every day | ORAL | Status: DC

## 2012-01-22 MED ORDER — PRAVASTATIN SODIUM 40 MG PO TABS: 40.0000 mg | ORAL_TABLET | Freq: Every day | ORAL | Status: DC

## 2012-01-22 NOTE — Patient Instructions (Signed)
Continue your current medications, unless you hear otherwise from [place] after your results.    Talk to your dentist about lump on lip--not sure if they can remove, vs referring to oral surgeon vs ear/nose/throat doctor.  I recommend having it removed considering that it seems to now be bothering you (after having for over 15 years)

## 2012-01-22 NOTE — Progress Notes (Signed)
Chief Complaint  Patient presents with  . Hypertension    fasting med check. (had a piece of candy on the way over here-ginger & mint hard candy)   HPI:  Hypertension follow-up:  Blood pressures are checked at [facility], but she doesn't write down and doesn't remember numbers, just that they are "always good".  Denies dizziness, headaches, chest pain, feet swelling, muscle cramps.  Denies side effects of medications. Denies cough.  Hyperlipidemia follow-up:  Patient is reportedly following a low-fat, low cholesterol diet. Cooks with butter, puts it on her grits. Uses Eggbeaters. Compliant with medications and denies medication side effects, no myalgias.  Past Medical History  Diagnosis Date  . Hypertension   . Diverticulosis   . Hemorrhoids   . Anemia   . Hyperlipidemia   . Colon polyp     colonoscopy 06/2009, due again 2016   Past Surgical History  Procedure Date  . Abdominal hysterectomy     for bleeding/benign  . Carpal tunnel release     right   History   Social History  . Marital Status: Married    Spouse Name: N/A    Number of Children: 4  . Years of Education: N/A   Occupational History  . MACHINE OPERATOR--Retired     Social History Main Topics  . Smoking status: Former Smoker    Quit date: 05/13/2005  . Smokeless tobacco: Never Used  . Alcohol Use: No  . Drug Use: No  . Sexually Active: Not Currently   Other Topics Concern  . Not on file   Social History Narrative   Retired 2011.  Lives with husband. Has 4 children, all in [place].  7 grandchildren, 1 great grandchild    Current Outpatient Prescriptions on File Prior to Visit  Medication Sig Dispense Refill  . aspirin 81 MG tablet Take 81 mg by mouth daily.        Marland Kitchen atenolol (TENORMIN) 25 MG tablet Take 1 tablet (25 mg total) by mouth daily.  90 tablet  1  . cholecalciferol (VITAMIN D) 1000 UNITS tablet Take 1,000 Units by mouth daily.      . fish oil-omega-3 fatty acids 1000 MG capsule Take 2 g by  mouth daily.        Marland Kitchen lisinopril-hydrochlorothiazide (PRINZIDE,ZESTORETIC) 20-12.5 MG per tablet Take 1 tablet by mouth daily.  90 tablet  1  . pravastatin (PRAVACHOL) 40 MG tablet Take 1 tablet (40 mg total) by mouth daily.  90 tablet  1   No Known Allergies  ROS:  Tingling in hands is much improved.  Recently burned her lip, and has a lump that periodically swells and comes down.  Has had lump x 15 years, just now started swelling more.  Denies headaches, dizziness, chest pain, fevers, URI symptoms, cough, shortness of breath, skin rashes, GI or GU complaints, joint pains, edema, or other concerns.  PHYSICAL EXAM: BP 138/78  Pulse 68  Ht 5\' 2"  (1.575 m)  Wt 195 lb (88.451 kg)  BMI 35.67 kg/m2 128/78 on repeat by MD, right arm Well developed, pleasant female in no distress Lip--wearing lipstick, but has small visible lump on right side of lower lip Neck: no lymphadenopathy, thyromegaly, mass or bruit Heart: regular rate and rhythm without murmur Lungs: clear bilaterally Abdomen: soft, nontender, no organomegaly or mass Extremities: no edema, 2+ pulse Skin no rash Psych: normal mood, affect, hygiene and grooming  ASSESSMENT/PLAN: 1. Essential hypertension, benign  Comprehensive metabolic panel, lisinopril-hydrochlorothiazide (PRINZIDE,ZESTORETIC) 20-12.5 MG per tablet, atenolol (  TENORMIN) 25 MG tablet  2. Pure hypercholesterolemia  Comprehensive metabolic panel, Lipid panel, pravastatin (PRAVACHOL) 40 MG tablet   HTN--controlled, continue current meds.  Hyperlipidemia--labs due  Lip mass--likely a cyst, but consider removal given recent change with swelling.  Recommended she discuss with dentist--likely either ENT or oral surgeon to remove it.  Flu shot recommended, declined by pt.  c-met, lipid today  CPE in 6 months (lipid, liver, vitamin D, TSH

## 2012-01-23 ENCOUNTER — Encounter: Payer: Self-pay | Admitting: Family Medicine

## 2012-03-11 ENCOUNTER — Ambulatory Visit (INDEPENDENT_AMBULATORY_CARE_PROVIDER_SITE_OTHER): Payer: Medicare Other | Admitting: Family Medicine

## 2012-03-11 ENCOUNTER — Encounter: Payer: Self-pay | Admitting: Family Medicine

## 2012-03-11 ENCOUNTER — Institutional Professional Consult (permissible substitution): Payer: Medicare Other | Admitting: Family Medicine

## 2012-03-11 VITALS — BP 130/82 | HR 60 | Ht 62.0 in | Wt 193.0 lb

## 2012-03-11 DIAGNOSIS — R899 Unspecified abnormal finding in specimens from other organs, systems and tissues: Secondary | ICD-10-CM

## 2012-03-11 DIAGNOSIS — R6889 Other general symptoms and signs: Secondary | ICD-10-CM

## 2012-03-11 NOTE — Patient Instructions (Addendum)
We are doing additional blood tests to make sure that you truly do NOT have any syphillis infection.  We will be in touch with your results in a few days.  I do not suspect that you have any active infection, but the blood tests drawn today will confirm.

## 2012-03-11 NOTE — Progress Notes (Signed)
Chief Complaint  Patient presents with  . Advice Only    [facility], pt declines flu shot   HPI: She presents today to discuss letter she received from the [employer].  She donated blood in order to find out what her blood type was (O+), and got a letter indicating possible syphillis.  She presents to discuss the results and have further testing done.  RPR qualitative nonreactive Confirmatory test equivocal Serologic test for syphilis reactive Final interpretation = equivocal.  (tests done were treponema pallidum- partial agglutination (TP-PA), enzyme immunoassay (EIA) for IgG ab's to TP (confirmatory test for TP-PA or RPR reactive specimens; EIA remains positive for life following treated infection) and RPR)  Had "the clap" as a teen.  No STD's since.  Hasn't been sexually active in 9-10 years.  Denies fevers, night sweats, skin rashes, chest pain, joint pains, or any other problems  Past Medical History  Diagnosis Date  . Hypertension   . Diverticulosis   . Hemorrhoids   . Anemia   . Hyperlipidemia   . Colon polyp     colonoscopy 06/2009, due again 2016   Past Surgical History  Procedure Date  . Abdominal hysterectomy     for bleeding/benign  . Carpal tunnel release     right   History   Social History  . Marital Status: Married    Spouse Name: N/A    Number of Children: 4  . Years of Education: N/A   Occupational History  . MACHINE OPERATOR--Retired     Social History Main Topics  . Smoking status: Former Smoker    Quit date: 05/13/2005  . Smokeless tobacco: Never Used  . Alcohol Use: No  . Drug Use: No  . Sexually Active: Not Currently   Other Topics Concern  . Not on file   Social History Narrative   Retired 2011.  Lives with husband. Has 4 children, all in [place].  7 grandchildren, 1 great grandchild    Current Outpatient Prescriptions on File Prior to Visit  Medication Sig Dispense Refill  . aspirin 81 MG tablet Take  81 mg by mouth daily.        Marland Kitchen atenolol (TENORMIN) 25 MG tablet Take 1 tablet (25 mg total) by mouth daily.  90 tablet  1  . cholecalciferol (VITAMIN D) 1000 UNITS tablet Take 1,000 Units by mouth daily.      . fish oil-omega-3 fatty acids 1000 MG capsule Take 2 g by mouth daily.        Marland Kitchen lisinopril-hydrochlorothiazide (PRINZIDE,ZESTORETIC) 20-12.5 MG per tablet Take 1 tablet by mouth daily.  90 tablet  1  . pravastatin (PRAVACHOL) 40 MG tablet Take 1 tablet (40 mg total) by mouth daily.  90 tablet  1   No Known Allergies  ROS:  See HPI   PHYSICAL EXAM: BP 130/82  Pulse 60  Ht 5\' 2"  (1.575 m)  Wt 193 lb (87.544 kg)  BMI 35.30 kg/m2 Well developed, pleasant female, in no distress Office visit limited to discussion of lab results and interpretation, counseling.  ASSESSMENT/PLAN: 1. Abnormal laboratory test  RPR, TPPA Reflex FTA   Recheck labs.  Expect R[facility] to be negative ([facility]).  If FTA+, then has active disease.  Patient will be notified when results are available. Advised that she will never be able to donate blood to [facility]. Told her blood type (O+), which was in her paperwork (which is why she actually went  to donate blood).

## 2012-03-19 ENCOUNTER — Encounter: Payer: Self-pay | Admitting: Family Medicine

## 2012-04-21 ENCOUNTER — Telehealth: Payer: Self-pay | Admitting: *Deleted

## 2012-04-27 NOTE — Telephone Encounter (Signed)
Done

## 2012-05-15 ENCOUNTER — Encounter: Payer: Self-pay | Admitting: Family Medicine

## 2012-07-20 ENCOUNTER — Encounter: Payer: Medicare Other | Admitting: Family Medicine

## 2012-08-10 ENCOUNTER — Other Ambulatory Visit: Payer: Self-pay | Admitting: Family Medicine

## 2012-09-02 ENCOUNTER — Encounter: Payer: Self-pay | Admitting: Family Medicine

## 2012-09-02 ENCOUNTER — Ambulatory Visit (INDEPENDENT_AMBULATORY_CARE_PROVIDER_SITE_OTHER): Payer: Medicare Other | Admitting: Family Medicine

## 2012-09-02 VITALS — BP 118/80 | HR 68 | Ht 62.0 in | Wt 201.0 lb

## 2012-09-02 DIAGNOSIS — S86912A Strain of unspecified muscle(s) and tendon(s) at lower leg level, left leg, initial encounter: Secondary | ICD-10-CM

## 2012-09-02 DIAGNOSIS — Z79899 Other long term (current) drug therapy: Secondary | ICD-10-CM

## 2012-09-02 DIAGNOSIS — IMO0002 Reserved for concepts with insufficient information to code with codable children: Secondary | ICD-10-CM

## 2012-09-02 DIAGNOSIS — E7800 Pure hypercholesterolemia, unspecified: Secondary | ICD-10-CM

## 2012-09-02 DIAGNOSIS — I10 Essential (primary) hypertension: Secondary | ICD-10-CM

## 2012-09-02 DIAGNOSIS — Z1329 Encounter for screening for other suspected endocrine disorder: Secondary | ICD-10-CM

## 2012-09-02 DIAGNOSIS — E559 Vitamin D deficiency, unspecified: Secondary | ICD-10-CM

## 2012-09-02 NOTE — Progress Notes (Signed)
Chief Complaint  Patient presents with  . Follow-up    fasting follow up.   Hypertension: She hasn't been able to check BP elsewhere since machine at [employer] broke.  Denies dizziness, headaches, chest pain, feet swelling, muscle cramps. Denies side effects of medications. Denies cough.  She eats canned foods, but doesn't salt her food.  Hyperlipidemia follow-up: Patient is reportedly following a low-fat, low cholesterol diet. Cooks with butter, puts it on her grits. Uses Eggbeaters. Compliant with medications and denies medication side effects, no myalgias.  She injured her left knee exercising last week.  It swelled up, but has improved.  Hasn't iced it yet, asking if we have ice packs.  Denies any giving way. She is currently not in any pain.  Past Medical History  Diagnosis Date  . Hypertension   . Diverticulosis   . Hemorrhoids   . Anemia   . Hyperlipidemia   . Colon polyp     colonoscopy 06/2009, due again 2016   Past Surgical History  Procedure Laterality Date  . Abdominal hysterectomy      for bleeding/benign  . Carpal tunnel release      right   History   Social History  . Marital Status: Married    Spouse Name: N/A    Number of Children: 4  . Years of Education: N/A   Occupational History  . MACHINE OPERATOR--Retired     Social History Main Topics  . Smoking status: Former Smoker    Quit date: 05/13/2005  . Smokeless tobacco: Never Used  . Alcohol Use: No  . Drug Use: No  . Sexually Active: Not Currently   Other Topics Concern  . Not on file   Social History Narrative   Retired 2011.  Lives with husband. Has 4 children, all in [place].  7 grandchildren, 1 great grandchild   Current Outpatient Prescriptions on File Prior to Visit  Medication Sig Dispense Refill  . aspirin 81 MG tablet Take 81 mg by mouth daily.        Marland Kitchen atenolol (TENORMIN) 25 MG tablet TAKE ONE TABLET BY MOUTH EVERY DAY  90 tablet  0  . cholecalciferol (VITAMIN D) 1000 UNITS tablet  Take 1,000 Units by mouth daily.      . fish oil-omega-3 fatty acids 1000 MG capsule Take 2 g by mouth daily.        Marland Kitchen lisinopril-hydrochlorothiazide (PRINZIDE,ZESTORETIC) 20-12.5 MG per tablet TAKE ONE TABLET BY MOUTH EVERY DAY  90 tablet  0  . pravastatin (PRAVACHOL) 40 MG tablet TAKE ONE TABLET BY MOUTH EVERY DAY  90 tablet  0   No current facility-administered medications on file prior to visit.   No Known Allergies  ROS: Denies fevers, URI symptoms, allergies, headaches, dizziness, cough, shortness of breath, chest pain, edema, skin rashes, bleeding/bruising, GI complaints, urinary complaints. +L knee pain (not currently, see HPI), no other joint pains  PHYSICAL EXAM: BP 142/90  Pulse 68  Ht 5\' 2"  (1.575 m)  Wt 201 lb (91.173 kg)  BMI 36.75 kg/m2 118/80 on repeat by MD, RA Well developed, pleasant female in no distress  Neck: no lymphadenopathy, thyromegaly, mass or bruit  Heart: regular rate and rhythm without murmur  Lungs: clear bilaterally  Abdomen: soft, nontender, no organomegaly or mass  Extremities: no edema, 2+ pulse. Pants wouldn't raise up above knee.  Palpated through clothing--no effusion noted.  Nontender.  FROM without pain.  No joint line tenderness. Negative lachman, McMurray Skin no rash, normal sensation Psych:  normal mood, affect, hygiene and grooming  ASSESSMENT/PLAN: Essential hypertension, benign - well controlled - Plan: Comprehensive metabolic panel  Pure hypercholesterolemia - Plan: Comprehensive metabolic panel, Lipid panel  Unspecified vitamin D deficiency - Plan: Vitamin D 25 hydroxy  Encounter for long-term (current) use of other medications - Plan: Comprehensive metabolic panel  Screening for thyroid disorder - Plan: TSH  Strain of knee and leg, left, initial encounter - date of injury 4/18.  gradually restart exercise.  Ice as needed for recurrent swelling.  Return if increasing pain, ongoing swelling, knee gives out.  Left knee strain,  resolving.  No evidence of internal derangement.  Just had 90 days of meds refilled 2-3 weeks ago.  Pharmacy will contact us for med refills in end of June/July  F/u 6 months CPE

## 2012-09-02 NOTE — Patient Instructions (Addendum)
gradually restart exercise.  Ice as needed for recurrent swelling.  Return if increasing pain, ongoing swelling, knee gives out. Try and exercise at least 30 minutes every day.  Weight loss is recommended.  Low sodium diet is recommended  Sodium-Controlled Diet Sodium is a mineral. It is found in many foods. Sodium may be found naturally or added during the making of a food. The most common form of sodium is salt, which is made up of sodium and chloride. Reducing your sodium intake involves changing your eating habits. The following guidelines will help you reduce the sodium in your diet:  Stop using the salt shaker.  Use salt sparingly in cooking and baking.  Substitute with sodium-free seasonings and spices.  Do not use a salt substitute (potassium chloride) without your caregiver's permission.  Include a variety of fresh, unprocessed foods in your diet.  Limit the use of processed and convenience foods that are high in sodium. USE THE FOLLOWING FOODS SPARINGLY: Breads/Starches  Commercial bread stuffing, commercial pancake or waffle mixes, coating mixes. Waffles. Croutons. Prepared (boxed or frozen) potato, rice, or noodle mixes that contain salt or sodium. Salted [place] fries or hash browns. Salted popcorn, breads, crackers, chips, or snack foods. Vegetables  Vegetables canned with salt or prepared in cream, butter, or cheese sauces. Sauerkraut. Tomato or vegetable juices canned with salt.  Fresh vegetables are allowed if rinsed thoroughly. Fruit  Fruit is okay to eat. Meat and Meat Substitutes  Salted or smoked meats, such as bacon or Canadian bacon, chipped or corned beef, hot dogs, salt pork, luncheon meats, pastrami, ham, or sausage. Canned or smoked fish, poultry, or meat. Processed cheese or cheese spreads, blue or Roquefort cheese. Battered or frozen fish products. Prepared spaghetti sauce. Baked beans. Reuben sandwiches. Salted nuts. Caviar. Milk  Limit buttermilk to  1 cup per week. Soups and Combination Foods  Bouillon cubes, canned or dried soups, broth, consomm. Convenience (frozen or packaged) dinners with more than 600 mg sodium. Pot pies, pizza, Asian food, fast food cheeseburgers, and specialty sandwiches. Desserts and Sweets  Regular (salted) desserts, pie, commercial fruit snack pies, commercial snack cakes, canned puddings.  Eat desserts and sweets in moderation. Fats and Oils  Gravy mixes or canned gravy. No more than 1 to 2 tbs of salad dressing. Chip dips.  Eat fats and oils in moderation. Beverages  See those listed under the vegetables and milk groups. Condiments  Ketchup, mustard, meat sauces, salsa, regular (salted) and lite soy sauce or mustard. Dill pickles, olives, meat tenderizer. Prepared horseradish or pickle relish. Dutch-processed cocoa. Baking powder or baking soda used medicinally. Worcestershire sauce. "Light" salt. Salt substitute, unless approved by your caregiver. Document Released: 10/19/2001 Document Revised: 07/22/2011 Document Reviewed: 05/22/2009 [facility] Patient Information 2013 [place], [place].

## 2012-09-03 ENCOUNTER — Encounter: Payer: Self-pay | Admitting: Family Medicine

## 2012-09-03 LAB — COMPREHENSIVE METABOLIC PANEL
ALT: 12 U/L (ref 0–35)
AST: 19 U/L (ref 0–37)
Albumin: 4.2 g/dL (ref 3.5–5.2)
Alkaline Phosphatase: 64 U/L (ref 39–117)
BUN: 17 mg/dL (ref 6–23)
CO2: 29 mEq/L (ref 19–32)
Calcium: 9.6 mg/dL (ref 8.4–10.5)
Chloride: 102 mEq/L (ref 96–112)
Creat: 0.91 mg/dL (ref 0.50–1.10)
Glucose, Bld: 97 mg/dL (ref 70–99)
Potassium: 3.8 mEq/L (ref 3.5–5.3)
Sodium: 140 mEq/L (ref 135–145)
Total Bilirubin: 0.6 mg/dL (ref 0.3–1.2)
Total Protein: 6.8 g/dL (ref 6.0–8.3)

## 2012-09-03 LAB — LIPID PANEL
Cholesterol: 142 mg/dL (ref 0–200)
HDL: 47 mg/dL (ref >39)
LDL Cholesterol: 72 mg/dL (ref 0–99)
Total CHOL/HDL Ratio: 3 Ratio
Triglycerides: 115 mg/dL (ref <150)
VLDL: 23 mg/dL (ref 0–40)

## 2012-09-03 LAB — VITAMIN D 25 HYDROXY (VIT D DEFICIENCY, FRACTURES): Vit D, 25-Hydroxy: 46 ng/mL (ref 30–89)

## 2012-09-03 LAB — TSH: TSH: 1.805 u[IU]/mL (ref 0.350–4.500)

## 2012-09-07 LAB — HM MAMMOGRAPHY: HM Mammogram: NEGATIVE

## 2012-09-08 ENCOUNTER — Encounter: Payer: Self-pay | Admitting: Internal Medicine

## 2012-11-09 ENCOUNTER — Telehealth: Payer: Self-pay | Admitting: Internal Medicine

## 2012-11-09 MED ORDER — ATENOLOL 25 MG PO TABS: 25.0000 mg | ORAL_TABLET | Freq: Every day | ORAL | Status: DC

## 2012-11-09 MED ORDER — PRAVASTATIN SODIUM 40 MG PO TABS: 40.0000 mg | ORAL_TABLET | Freq: Every day | ORAL | Status: DC

## 2012-11-09 MED ORDER — LISINOPRIL-HYDROCHLOROTHIAZIDE 20-12.5 MG PO TABS: 1.0000 | ORAL_TABLET | Freq: Every day | ORAL | Status: DC

## 2012-11-09 NOTE — Telephone Encounter (Signed)
Refill request for pravastatin,atenolol, lisinopril-hctz to [facility]

## 2012-11-09 NOTE — Telephone Encounter (Signed)
Done

## 2013-03-04 ENCOUNTER — Encounter: Payer: Self-pay | Admitting: Family Medicine

## 2013-03-04 ENCOUNTER — Ambulatory Visit (INDEPENDENT_AMBULATORY_CARE_PROVIDER_SITE_OTHER): Payer: Medicare Other | Admitting: Family Medicine

## 2013-03-04 VITALS — BP 144/78 | HR 60 | Ht 62.0 in | Wt 205.0 lb

## 2013-03-04 DIAGNOSIS — E669 Obesity, unspecified: Secondary | ICD-10-CM

## 2013-03-04 DIAGNOSIS — R229 Localized swelling, mass and lump, unspecified: Secondary | ICD-10-CM

## 2013-03-04 DIAGNOSIS — E7800 Pure hypercholesterolemia, unspecified: Secondary | ICD-10-CM

## 2013-03-04 DIAGNOSIS — Z23 Encounter for immunization: Secondary | ICD-10-CM

## 2013-03-04 DIAGNOSIS — Z0000 Encounter for general adult medical examination without abnormal findings: Secondary | ICD-10-CM

## 2013-03-04 DIAGNOSIS — I10 Essential (primary) hypertension: Secondary | ICD-10-CM

## 2013-03-04 LAB — COMPREHENSIVE METABOLIC PANEL
ALT: 13 U/L (ref 0–35)
AST: 20 U/L (ref 0–37)
Albumin: 4.5 g/dL (ref 3.5–5.2)
Alkaline Phosphatase: 72 U/L (ref 39–117)
BUN: 20 mg/dL (ref 6–23)
CO2: 30 mEq/L (ref 19–32)
Calcium: 9.6 mg/dL (ref 8.4–10.5)
Chloride: 101 mEq/L (ref 96–112)
Creat: 0.99 mg/dL (ref 0.50–1.10)
Glucose, Bld: 96 mg/dL (ref 70–99)
Potassium: 3.9 mEq/L (ref 3.5–5.3)
Sodium: 140 mEq/L (ref 135–145)
Total Bilirubin: 0.5 mg/dL (ref 0.3–1.2)
Total Protein: 7.1 g/dL (ref 6.0–8.3)

## 2013-03-04 LAB — POCT URINALYSIS DIPSTICK
Bilirubin, UA: NEGATIVE
Glucose, UA: NEGATIVE
Ketones, UA: NEGATIVE
Nitrite, UA: NEGATIVE
Protein, UA: NEGATIVE
Spec Grav, UA: 1.015
Urobilinogen, UA: NEGATIVE
pH, UA: 5

## 2013-03-04 LAB — LIPID PANEL
Cholesterol: 167 mg/dL (ref 0–200)
HDL: 50 mg/dL (ref >39)
LDL Cholesterol: 94 mg/dL (ref 0–99)
Total CHOL/HDL Ratio: 3.3 Ratio
Triglycerides: 117 mg/dL (ref <150)
VLDL: 23 mg/dL (ref 0–40)

## 2013-03-04 MED ORDER — ATENOLOL 25 MG PO TABS: 25.0000 mg | ORAL_TABLET | Freq: Every day | ORAL | Status: DC

## 2013-03-04 MED ORDER — PRAVASTATIN SODIUM 40 MG PO TABS: 40.0000 mg | ORAL_TABLET | Freq: Every day | ORAL | Status: DC

## 2013-03-04 MED ORDER — LISINOPRIL-HYDROCHLOROTHIAZIDE 20-12.5 MG PO TABS: 1.0000 | ORAL_TABLET | Freq: Every day | ORAL | Status: DC

## 2013-03-04 NOTE — Progress Notes (Signed)
Chief Complaint  Patient presents with  . Annual Exam    fasting annual exam/me check with pap. UA showed 1+ leuks and trace blood, patient is asymptomatic.  Has a bump on her bottom lip that has been there for a while, would like possible referral to have this looked at. Patient declines flu vaccine.    Sylvia Gutierrez is a 67 y.o. female who presents for a complete physical.  She has the following concerns:  Lump on right lower lip has been present for about 3 years.  Unsure if she had some sort of trauma or burn in the past.  It is not painful.  Sometimes it gets larger, but then gets smaller again.  Never drains or bleeds.  She would like this looked at and possibly removed.  Med check:  Hypertension follow-up: Not checking BP elsewhere. Denies dizziness, headaches, chest pain. Denies side effects of medications. She noticed a change when she switched from [employer] to [place] (felt a little dizzy), but that has resolved. +canned foods and processed meats (salami)  Hyperlipidemia follow-up: Patient is reportedly trying to following a low-fat, low cholesterol diet. Compliant with medications and denies medication side effects.   Immunization History  Administered Date(s) Administered  . Pneumococcal Polysaccharide 11/28/2010  . Td 05/13/2005  . Tdap 06/20/2011  . Zoster 01/10/2010  she has refused flu shots in the past  Last Pap smear: 07/04/08  Last mammogram: 08/2012 Last colonoscopy: 06/14/09  Last DEXA: pt reports having had normal DEXA scan (not in our records, so prior to 2010)  Dentist: within the last 4 months (teeth pulled, and partials made) Ophtho: 01/2012 Exercise: gym 3x/week, cardio x 1 hour (elliptical, uses arms)  Past Medical History  Diagnosis Date  . Hypertension   . Diverticulosis   . Hemorrhoids   . Anemia   . Hyperlipidemia   . Colon polyp     colonoscopy 06/2009, due again 2016    Past Surgical History  Procedure Laterality Date  . Abdominal hysterectomy       for bleeding/benign  . Carpal tunnel release      right    History   Social History  . Marital Status: Married    Spouse Name: N/A    Number of Children: 4  . Years of Education: N/A   Occupational History  . MACHINE OPERATOR--Retired     Social History Main Topics  . Smoking status: Former Smoker    Quit date: 05/13/2005  . Smokeless tobacco: Never Used  . Alcohol Use: No  . Drug Use: No  . Sexual Activity: Not Currently   Other Topics Concern  . Not on file   Social History Narrative   Retired 2011.  Separate from her husband 2011.  Lives alone.  Has 4 children, all in [place].  7 grandchildren, 2 great grandchild    Family History  Problem Relation Age of Onset  . Hypertension Mother   . Cancer Father     colon (60's)  . Colon cancer Father   . Cancer Sister     throat  . Hypertension Brother   . Diabetes Neg Hx   . Breast cancer Neg Hx   . Hypertension Brother   . Hypertension Brother   . Cancer Brother     prostate    Current outpatient prescriptions:aspirin 81 MG tablet, Take 81 mg by mouth daily.  , Disp: , Rfl: ;  atenolol (TENORMIN) 25 MG tablet, Take 1 tablet (25 mg total) by mouth  daily., Disp: 90 tablet, Rfl: 1;  cholecalciferol (VITAMIN D) 1000 UNITS tablet, Take 1,000 Units by mouth daily., Disp: , Rfl: ;  fish oil-omega-3 fatty acids 1000 MG capsule, Take 2 g by mouth daily.  , Disp: , Rfl:  lisinopril-hydrochlorothiazide (PRINZIDE,ZESTORETIC) 20-12.5 MG per tablet, Take 1 tablet by mouth daily., Disp: 90 tablet, Rfl: 1;  pravastatin (PRAVACHOL) 40 MG tablet, Take 1 tablet (40 mg total) by mouth daily., Disp: 90 tablet, Rfl: 1  No Known Allergies  ROS: The patient denies anorexia, fever, headaches, vision changes, decreased hearing, ear pain, sore throat, breast concerns, chest pain, palpitations, dizziness, syncope, dyspnea on exertion, swelling, nausea, vomiting, diarrhea, constipation, abdominal pain, melena, hematochezia,  indigestion/heartburn, hematuria, incontinence, dysuria, vaginal bleeding, discharge, odor or itch, genital lesions, weakness, tremor, suspicious skin lesions, depression, anxiety, abnormal bleeding/bruising, or enlarged lymph nodes.  + L knee pain x 7 years, has seen ortho and was told she has arthritis. Had some pain last week after using the exercise bike. Improving with aspirin and topical cream. +12 pound weight gain since 02/2012  PHYSICAL EXAM: BP 144/78  Pulse 60  Ht 5\' 2"  (1.575 m)  Wt 205 lb (92.987 kg)  BMI 37.49 kg/m2  General Appearance:  Alert, cooperative, no distress, appears stated age   Head:  Normocephalic, without obvious abnormality, atraumatic   Eyes:  PERRL, conjunctiva/corneas clear, EOM's intact, fundi  benign   Ears:  Normal TM's and external ear canals   Nose:  Nares normal, mucosa mildly edematous, no drainage or sinus tenderness   Throat:  Mucosa, and tongue normal;  Dentures; Bluish tinged nodule at right lower lip, nontender.  approx 3mm in size  Neck:  Supple, no lymphadenopathy; thyroid: no enlargement/tenderness/nodules; no carotid  bruit or JVD   Back:  Spine nontender, no curvature, ROM normal, no CVA tenderness   Lungs:  Clear to auscultation bilaterally without wheezes, rales or ronchi; respirations unlabored   Chest Wall:  No tenderness or deformity   Heart:  Regular rate and rhythm, S1 and S2 normal, no murmur, rub  or gallop   Breast Exam:  No tenderness, masses, or nipple discharge or inversion. No axillary lymphadenopathy   Abdomen:  Soft, non-tender, nondistended, normoactive bowel sounds,  no masses, no hepatosplenomegaly   Genitalia:  Normal external genitalia without lesions. BUS and vagina normal; uterus surgically absent. No adnexal masses or tenderness. Pap not performed   Rectal:  Normal tone, no masses or tenderness; guaiac negative stool   Extremities:  No clubbing, cyanosis or edema. Thickened onychomycotic great toenails  bilaterally, not ingrowing   Pulses:  2+ and symmetric all extremities   Skin:  Skin color, texture, turgor normal, no rashes or lesions   Lymph nodes:  Cervical, supraclavicular, and axillary nodes normal   Neurologic:  CNII-XII intact, normal strength, sensation and gait; reflexes 2+ and symmetric throughout          Psych: Normal mood, affect, hygiene and grooming.    ASSESSMENT/PLAN:  Routine general medical examination at a health care facility - Plan: POCT Urinalysis Dipstick, Visual acuity screening  Pure hypercholesterolemia - Plan: Comprehensive metabolic panel, Lipid panel, pravastatin (PRAVACHOL) 40 MG tablet  Essential hypertension, benign - elevated today--monitor elsewhere.  reviewed low sodium diet.  daily exercise and weight loss encouraged - Plan: Comprehensive metabolic panel, lisinopril-hydrochlorothiazide (PRINZIDE,ZESTORETIC) 20-12.5 MG per tablet, atenolol (TENORMIN) 25 MG tablet  Need for prophylactic vaccination and inoculation against influenza - Plan: Flu vaccine HIGH DOSE PF (Fluzone Tri High dose)  Nodule, subcutaneous - lip nodule--appears vascular.  likely gets irritated/inflamed periodically.  desires excision.  refer to ENT - Plan: Ambulatory referral to ENT  Obesity (BMI 30-39.9)  Discussed monthly self breast exams and yearly mammograms; at least 30 minutes of aerobic activity at least 5 days/week; proper sunscreen use reviewed; healthy diet, including goals of calcium and vitamin D intake and alcohol recommendations (less than or equal to 1 drink/day) reviewed; regular seatbelt use; changing batteries in smoke detectors.  Immunization recommendations discussed--flu shot today. Counseled re: benefits and reasons to get, and she was willing to proceed with flu shot after discussion.  Colonoscopy recommendations reviewed--due again 2016.  Hemoccult kit given  Discussed proper seat position for recumbent bike (had been too close, causing increased knee  pain)  Diet and exercise reviewed extensively  Check BP's periodically elsewhere.  Low sodium diet reviewed.

## 2013-03-04 NOTE — Patient Instructions (Signed)
HEALTH MAINTENANCE RECOMMENDATIONS:  It is recommended that you get at least 30 minutes of aerobic exercise at least 5 days/week (for weight loss, you may need as much as 60-90 minutes). This can be any activity that gets your heart rate up. This can be divided in 10-15 minute intervals if needed, but try and build up your endurance at least once a week.  Weight bearing exercise is also recommended twice weekly.  Eat a healthy diet with lots of vegetables, fruits and fiber.  "Colorful" foods have a lot of vitamins (ie green vegetables, tomatoes, red peppers, etc).  Limit sweet tea, regular sodas and alcoholic beverages, all of which has a lot of calories and sugar.  Up to 1 alcoholic drink daily may be beneficial for women (unless trying to lose weight, watch sugars).  Drink a lot of water.  Calcium recommendations are 1200-1500 mg daily (1500 mg for postmenopausal women or women without ovaries), and vitamin D 1000 IU daily.  This should be obtained from diet and/or supplements (vitamins), and calcium should not be taken all at once, but in divided doses.  Monthly self breast exams and yearly mammograms for women over the age of 24 is recommended.  Sunscreen of at least SPF 30 should be used on all sun-exposed parts of the skin when outside between the hours of 10 am and 4 pm (not just when at beach or pool, but even with exercise, golf, tennis, and yard work!)  Use a sunscreen that says "broad spectrum" so it covers both UVA and UVB rays, and make sure to reapply every 1-2 hours.  Remember to change the batteries in your smoke detectors when changing your clock times in the spring and fall.  Use your seat belt every time you are in a car, and please drive safely and not be distracted with cell phones and texting while driving.  Periodically check your blood pressures.  Goal is to be <135/85 (ideally <130/80). If they are consistently >140/90 you need to return sooner than your regular 6 month  appointment, and bring list of blood pressures to the visit.  Cutting back on the salt/sodium in the diet will help keep the blood pressure low, as well as daily exercise and weight loss will help.  Sodium-Controlled Diet Sodium is a mineral. It is found in many foods. Sodium may be found naturally or added during the making of a food. The most common form of sodium is salt, which is made up of sodium and chloride. Reducing your sodium intake involves changing your eating habits. The following guidelines will help you reduce the sodium in your diet:  Stop using the salt shaker.  Use salt sparingly in cooking and baking.  Substitute with sodium-free seasonings and spices.  Do not use a salt substitute (potassium chloride) without your caregiver's permission.  Include a variety of fresh, unprocessed foods in your diet.  Limit the use of processed and convenience foods that are high in sodium. USE THE FOLLOWING FOODS SPARINGLY: Breads/Starches  Commercial bread stuffing, commercial pancake or waffle mixes, coating mixes. Waffles. Croutons. Prepared (boxed or frozen) potato, rice, or noodle mixes that contain salt or sodium. Salted [place] fries or hash browns. Salted popcorn, breads, crackers, chips, or snack foods. Vegetables  Vegetables canned with salt or prepared in cream, butter, or cheese sauces. Sauerkraut. Tomato or vegetable juices canned with salt.  Fresh vegetables are allowed if rinsed thoroughly. Fruit  Fruit is okay to eat. Meat and Meat Substitutes  Salted  or smoked meats, such as bacon or Canadian bacon, chipped or corned beef, hot dogs, salt pork, luncheon meats, pastrami, ham, or sausage. Canned or smoked fish, poultry, or meat. Processed cheese or cheese spreads, blue or Roquefort cheese. Battered or frozen fish products. Prepared spaghetti sauce. Baked beans. Reuben sandwiches. Salted nuts. Caviar. Milk  Limit buttermilk to 1 cup per week. Soups and Combination  Foods  Bouillon cubes, canned or dried soups, broth, consomm. Convenience (frozen or packaged) dinners with more than 600 mg sodium. Pot pies, pizza, Asian food, fast food cheeseburgers, and specialty sandwiches. Desserts and Sweets  Regular (salted) desserts, pie, commercial fruit snack pies, commercial snack cakes, canned puddings.  Eat desserts and sweets in moderation. Fats and Oils  Gravy mixes or canned gravy. No more than 1 to 2 tbs of salad dressing. Chip dips.  Eat fats and oils in moderation. Beverages  See those listed under the vegetables and milk groups. Condiments  Ketchup, mustard, meat sauces, salsa, regular (salted) and lite soy sauce or mustard. Dill pickles, olives, meat tenderizer. Prepared horseradish or pickle relish. Dutch-processed cocoa. Baking powder or baking soda used medicinally. Worcestershire sauce. "Light" salt. Salt substitute, unless approved by your caregiver. Document Released: 10/19/2001 Document Revised: 07/22/2011 Document Reviewed: 05/22/2009 [facility] Patient Information 2014 [place], [place].

## 2013-03-05 ENCOUNTER — Encounter: Payer: Self-pay | Admitting: Family Medicine

## 2013-03-05 DIAGNOSIS — E669 Obesity, unspecified: Secondary | ICD-10-CM | POA: Insufficient documentation

## 2013-03-11 ENCOUNTER — Other Ambulatory Visit (INDEPENDENT_AMBULATORY_CARE_PROVIDER_SITE_OTHER): Payer: Medicare Other

## 2013-03-11 DIAGNOSIS — Z1211 Encounter for screening for malignant neoplasm of colon: Secondary | ICD-10-CM

## 2013-03-11 LAB — HEMOCCULT GUIAC POC 1CARD (OFFICE)
Card #2 Fecal Occult Blod, POC: POSITIVE
Card #3 Fecal Occult Blood, POC: NEGATIVE
Fecal Occult Blood, POC: POSITIVE

## 2013-03-15 ENCOUNTER — Other Ambulatory Visit: Payer: Self-pay | Admitting: *Deleted

## 2013-03-15 DIAGNOSIS — K921 Melena: Secondary | ICD-10-CM

## 2013-03-19 ENCOUNTER — Other Ambulatory Visit: Payer: Medicaid Other

## 2013-03-19 DIAGNOSIS — K921 Melena: Secondary | ICD-10-CM

## 2013-03-19 LAB — CBC WITH DIFFERENTIAL/PLATELET
Basophils Absolute: 0 10*3/uL (ref 0.0–0.1)
Basophils Relative: 0 % (ref 0–1)
Eosinophils Absolute: 0 10*3/uL (ref 0.0–0.7)
Eosinophils Relative: 1 % (ref 0–5)
HCT: 38.2 % (ref 36.0–46.0)
Hemoglobin: 12.9 g/dL (ref 12.0–15.0)
Lymphocytes Relative: 29 % (ref 12–46)
Lymphs Abs: 1.5 10*3/uL (ref 0.7–4.0)
MCH: 28.6 pg (ref 26.0–34.0)
MCHC: 33.8 g/dL (ref 30.0–36.0)
MCV: 84.7 fL (ref 78.0–100.0)
Monocytes Absolute: 0.3 10*3/uL (ref 0.1–1.0)
Monocytes Relative: 6 % (ref 3–12)
Neutro Abs: 3.4 10*3/uL (ref 1.7–7.7)
Neutrophils Relative %: 64 % (ref 43–77)
Platelets: 227 10*3/uL (ref 150–400)
RBC: 4.51 MIL/uL (ref 3.87–5.11)
RDW: 15.3 % (ref 11.5–15.5)
WBC: 5.2 10*3/uL (ref 4.0–10.5)

## 2013-03-19 LAB — IRON: Iron: 94 ug/dL (ref 42–145)

## 2013-03-19 LAB — FERRITIN: Ferritin: 114 ng/mL (ref 10–291)

## 2013-03-25 ENCOUNTER — Telehealth: Payer: Self-pay | Admitting: Family Medicine

## 2013-03-25 ENCOUNTER — Other Ambulatory Visit: Payer: Self-pay | Admitting: *Deleted

## 2013-03-25 DIAGNOSIS — E7800 Pure hypercholesterolemia, unspecified: Secondary | ICD-10-CM

## 2013-03-25 DIAGNOSIS — I10 Essential (primary) hypertension: Secondary | ICD-10-CM

## 2013-03-25 MED ORDER — PRAVASTATIN SODIUM 40 MG PO TABS: 40.0000 mg | ORAL_TABLET | Freq: Every day | ORAL | Status: DC

## 2013-03-25 MED ORDER — ATENOLOL 25 MG PO TABS: 25.0000 mg | ORAL_TABLET | Freq: Every day | ORAL | Status: DC

## 2013-03-25 MED ORDER — LISINOPRIL-HYDROCHLOROTHIAZIDE 20-12.5 MG PO TABS: 1.0000 | ORAL_TABLET | Freq: Every day | ORAL | Status: DC

## 2013-03-25 NOTE — Telephone Encounter (Signed)
Pt is requesting refills on Lisinopril-Hydrochlorothiazide, Pravastatin, and Atenolol sent to [facility].  Please let patient know when done.

## 2013-09-02 ENCOUNTER — Encounter: Payer: Self-pay | Admitting: Family Medicine

## 2013-09-02 ENCOUNTER — Ambulatory Visit (INDEPENDENT_AMBULATORY_CARE_PROVIDER_SITE_OTHER): Payer: 59 | Admitting: Family Medicine

## 2013-09-02 VITALS — BP 122/72 | HR 68 | Ht 62.0 in | Wt 206.0 lb

## 2013-09-02 DIAGNOSIS — E7800 Pure hypercholesterolemia, unspecified: Secondary | ICD-10-CM

## 2013-09-02 DIAGNOSIS — I10 Essential (primary) hypertension: Secondary | ICD-10-CM | POA: Diagnosis not present

## 2013-09-02 DIAGNOSIS — R195 Other fecal abnormalities: Secondary | ICD-10-CM

## 2013-09-02 DIAGNOSIS — E669 Obesity, unspecified: Secondary | ICD-10-CM

## 2013-09-02 DIAGNOSIS — Z79899 Other long term (current) drug therapy: Secondary | ICD-10-CM | POA: Diagnosis not present

## 2013-09-02 LAB — HEPATIC FUNCTION PANEL
ALT: 13 U/L (ref 0–35)
AST: 16 U/L (ref 0–37)
Albumin: 4.1 g/dL (ref 3.5–5.2)
Alkaline Phosphatase: 65 U/L (ref 39–117)
Bilirubin, Direct: 0.1 mg/dL (ref 0.0–0.3)
Indirect Bilirubin: 0.5 mg/dL (ref 0.2–1.2)
Total Bilirubin: 0.6 mg/dL (ref 0.2–1.2)
Total Protein: 6.6 g/dL (ref 6.0–8.3)

## 2013-09-02 LAB — LIPID PANEL
Cholesterol: 160 mg/dL (ref 0–200)
HDL: 51 mg/dL (ref >39)
LDL Cholesterol: 93 mg/dL (ref 0–99)
Total CHOL/HDL Ratio: 3.1 Ratio
Triglycerides: 82 mg/dL (ref <150)
VLDL: 16 mg/dL (ref 0–40)

## 2013-09-02 NOTE — Patient Instructions (Signed)
Continue your current medications.  Try and exercise at least 30 minutes daily as we discussed.  Try eat more fruits and vegetables, cut back on portions, carbs and high calorie/high fat snacks in order to lose some weight.  Please return the stool cards at your convenience.  As discussed, don't take the aspirin prior to collecting, and don't collect when having any kind of bleeding (nose/gums/hemorrhoids, etc).

## 2013-09-02 NOTE — Progress Notes (Signed)
Chief Complaint  Patient presents with  . Hypertension    fasting med check. No concerns.   Hypertension follow-up:  BP's elsewhere are "fair or good"--she doesn't write them down. Denies dizziness, headaches, chest pain. Denies side effects of medications. She stopped buying salami/processed meats. Switched from canned vegetables to frozen.  Hyperlipidemia follow-up: Patient is reportedly trying to following a low-fat, low cholesterol diet. Compliant with medications and denies medication side effects.   She had 2/3 +hemoccult cards back in October.  She denies any bowel changes, abdominal pain or any blood in stools.  We had discussed repeating them in the future.  Denies fatigue, easy bleeding/bruising.  Since last visit she had lip lesion removed; it was benign.  Obesity:  She goes to the gym 2x/week--silver sneakers class and elliptical.  Past Medical History  Diagnosis Date  . Hypertension   . Diverticulosis   . Hemorrhoids   . Anemia   . Hyperlipidemia   . Colon polyp     colonoscopy 06/2009, due again 2016   Past Surgical History  Procedure Laterality Date  . Abdominal hysterectomy      for bleeding/benign  . Carpal tunnel release      right   History   Social History  . Marital Status: Married    Spouse Name: N/A    Number of Children: 4  . Years of Education: N/A   Occupational History  . MACHINE OPERATOR--Retired     Social History Main Topics  . Smoking status: Former Smoker    Quit date: 05/13/2005  . Smokeless tobacco: Never Used  . Alcohol Use: No  . Drug Use: No  . Sexual Activity: Not Currently   Other Topics Concern  . Not on file   Social History Narrative   Retired 2011.  Separate from her husband 2011.  Lives alone.  Has 4 children, all in [place].  7 grandchildren, 2 great grandchild   Outpatient Encounter Prescriptions as of 09/02/2013  Medication Sig  . aspirin 81 MG tablet Take 81 mg by mouth daily.    Marland Kitchen atenolol (TENORMIN) 25 MG  tablet Take 1 tablet (25 mg total) by mouth daily.  . cholecalciferol (VITAMIN D) 1000 UNITS tablet Take 1,000 Units by mouth daily.  . fish oil-omega-3 fatty acids 1000 MG capsule Take 2 g by mouth daily.    Marland Kitchen lisinopril-hydrochlorothiazide (PRINZIDE,ZESTORETIC) 20-12.5 MG per tablet Take 1 tablet by mouth daily.  . pravastatin (PRAVACHOL) 40 MG tablet Take 1 tablet (40 mg total) by mouth daily.   No Known Allergies  ROS:  She denies fevers, chills, fatigue, headaches, dizziness, chest pain, palpitations, shortness of breath, URI symptoms.  She denies any thyroid symptoms; has always been cold-natured--no hair/skin/bowel/mood changes.  See HPI  PHYSICAL EXAM: BP 122/72  Pulse 68  Ht '5\' 2"'  (1.575 m)  Wt 206 lb (93.441 kg)  BMI 37.67 kg/m2 Well developed, pleasant, obese female in no distress Neck: no lymphadenopathy, thyromegaly, mass or bruit  Heart: regular rate and rhythm without murmur  Lungs: clear bilaterally  Abdomen: soft, nontender, no organomegaly or mass  Extremities: no edema, 2+ pulse. Skin no rash, normal sensation  Psych: normal mood, affect, hygiene and grooming  ASSESSMENT/PLAN:  Essential hypertension, benign - controlled  Pure hypercholesterolemia - Plan: Lipid panel, Hepatic function panel  Obesity (BMI 30-39.9) - counseled extensively re: diet, healthy snacks, portions, exercise and weight loss  Encounter for long-term (current) use of other medications - Plan: Hepatic function panel  Heme positive  stool - no associated anemia.  repeat hemoccult cards--proper directions reviewed, kit given   Hemoccult +--repeat kit given with instructions to hold aspirin, and do when not having any bleeding at all (ie nosebleeds, gums bleeding, hemorrhoids, etc).  H/o Vitamin D deficiency--normal values in 08/2012 and 06/2011 since being on vitamin D.  Compliant with supplements.  Continue (no need to recheck).  Plan to recheck TSH with her CPE in October (has been normal,  and no symptoms)  She states she just started her last 90 day bottle. She has switched insurance, and will contact us when she needs refills, and let us know which pharmacy (no longer will be using the same [facility]).   F/u 6 mos for CPE/med check, sooner prn.

## 2013-09-08 LAB — HM MAMMOGRAPHY

## 2013-09-10 ENCOUNTER — Other Ambulatory Visit (INDEPENDENT_AMBULATORY_CARE_PROVIDER_SITE_OTHER): Payer: 59

## 2013-09-10 ENCOUNTER — Encounter: Payer: Self-pay | Admitting: Family Medicine

## 2013-09-10 DIAGNOSIS — Z1211 Encounter for screening for malignant neoplasm of colon: Secondary | ICD-10-CM

## 2013-09-10 LAB — HEMOCCULT GUIAC POC 1CARD (OFFICE)
Card #2 Fecal Occult Blod, POC: NEGATIVE
Card #3 Fecal Occult Blood, POC: NEGATIVE
Fecal Occult Blood, POC: NEGATIVE

## 2013-09-14 ENCOUNTER — Encounter: Payer: Self-pay | Admitting: Internal Medicine

## 2013-09-23 ENCOUNTER — Telehealth: Payer: Self-pay | Admitting: Family Medicine

## 2013-09-23 NOTE — Telephone Encounter (Signed)
Not sure who sent letter.  She is UTD on physical (October) and has had a hysterectomy, so doesn't need a pap

## 2013-09-24 NOTE — Telephone Encounter (Signed)
Left message for pt

## 2013-10-25 ENCOUNTER — Encounter: Payer: Self-pay | Admitting: Family Medicine

## 2013-10-25 ENCOUNTER — Ambulatory Visit (INDEPENDENT_AMBULATORY_CARE_PROVIDER_SITE_OTHER): Payer: 59 | Admitting: Family Medicine

## 2013-10-25 VITALS — BP 122/78 | HR 60 | Ht 62.0 in | Wt 199.0 lb

## 2013-10-25 DIAGNOSIS — B351 Tinea unguium: Secondary | ICD-10-CM | POA: Insufficient documentation

## 2013-10-25 NOTE — Progress Notes (Signed)
Chief Complaint  Patient presents with  . Nail Problem    thinks she may have some toenail fungus on both big toes, left is worse than right.    She has noticed fungus in her toenails for about a year.  She has tried some over-the-counter treatments (cream), thought it worked at first, but realizes that it isn't clearing it up.  Only the big toes are involved.  She denies any pain or ingrown toenails. She is asking about treatment.  Past Medical History  Diagnosis Date  . Hypertension   . Diverticulosis   . Hemorrhoids   . Anemia   . Hyperlipidemia   . Colon polyp     colonoscopy 06/2009, due again 2016   Past Surgical History  Procedure Laterality Date  . Abdominal hysterectomy      for bleeding/benign  . Carpal tunnel release      right   History   Social History  . Marital Status: Married    Spouse Name: N/A    Number of Children: 4  . Years of Education: N/A   Occupational History  . MACHINE OPERATOR--Retired     Social History Main Topics  . Smoking status: Former Smoker    Quit date: 05/13/2005  . Smokeless tobacco: Never Used  . Alcohol Use: No  . Drug Use: No  . Sexual Activity: Not Currently   Other Topics Concern  . Not on file   Social History Narrative   Retired 2011.  Separate from her husband 2011.  Lives alone.  Has 4 children, all in [place].  7 grandchildren, 2 great grandchild   Outpatient Encounter Prescriptions as of 10/25/2013  Medication Sig  . aspirin 81 MG tablet Take 81 mg by mouth daily.    Marland Kitchen atenolol (TENORMIN) 25 MG tablet Take 1 tablet (25 mg total) by mouth daily.  . cholecalciferol (VITAMIN D) 1000 UNITS tablet Take 1,000 Units by mouth daily.  . fish oil-omega-3 fatty acids 1000 MG capsule Take 2 g by mouth daily.    Marland Kitchen lisinopril-hydrochlorothiazide (PRINZIDE,ZESTORETIC) 20-12.5 MG per tablet Take 1 tablet by mouth daily.  . pravastatin (PRAVACHOL) 40 MG tablet Take 1 tablet (40 mg total) by mouth daily.   No Known  Allergies  ROS:  Denies fevers, chills, GI complaints, URI symptoms, swelling, rashes, pain or other concerns.  PHYSICAL EXAM: BP 122/78  Pulse 60  Ht 5\' 2"  (1.575 m)  Wt 199 lb (90.266 kg)  BMI 36.39 kg/m2 Pleasant, obese female in no distress Extremities:  L great toenail is thickened and discolored. R great toenail has distal discoloration (distal 1/2 of the nail), not thickened.  Proximal portion appears entirely normal. She has mild discoloration from recently taking off nail polish on all toes. Other nails are not involved. Normal pulses, sensation  ASSESSMENT/PLAN:  Onychomycosis of toenail - L>R great toes, with no ingrowing nail, infection or pain  Minimally symptomatic, so treatment is cosmetic.  Discussed that meds might not be covered.   Risks, side effects and alternatives were reviewed in detail, as well as recommended monitoring.  Discussed the normal treatment length, and time for nail to appear normal (if gets good response to med).  She decided not to treat at this time, and will let us know if she changes her mind (understanding that we likely would have to get prior auth, and it might not be approved.)  Given her also being on statins, recommend LFT monitoring at 4 and 8 weeks (has normal baseline  from April) if meds are started.

## 2013-10-25 NOTE — Patient Instructions (Signed)

## 2013-10-28 ENCOUNTER — Telehealth: Payer: Self-pay | Admitting: *Deleted

## 2013-10-28 ENCOUNTER — Telehealth: Payer: Self-pay | Admitting: Family Medicine

## 2013-10-28 ENCOUNTER — Other Ambulatory Visit: Payer: Self-pay | Admitting: *Deleted

## 2013-10-28 DIAGNOSIS — Z79899 Other long term (current) drug therapy: Secondary | ICD-10-CM

## 2013-10-28 DIAGNOSIS — B351 Tinea unguium: Secondary | ICD-10-CM

## 2013-10-28 MED ORDER — TERBINAFINE HCL 250 MG PO TABS: 250.0000 mg | ORAL_TABLET | Freq: Every day | ORAL | Status: DC

## 2013-10-28 NOTE — Telephone Encounter (Signed)
Patient called and would like to start oral fungal treatment. She can come in today if you need a liver panel. She uses [facility].

## 2013-10-28 NOTE — Telephone Encounter (Signed)
Patient advised. She will go to or call pharmacy-if no prior Sylvia Gutierrez is needed she will call me back and we will schedule 1 and 2 month liver labs. If prior Sylvia Gutierrez is needed we will talk again once she starts medication and schedule the two lab appts. Pt verbalized understanding.

## 2013-10-28 NOTE — Telephone Encounter (Signed)
I sent rx to pharmacy, but I'm not sure if prior auth will be needed.  She doesn't need labs now, but she will need to schedule LFT's for 1 and 2 months from when she starts the medication (not sure if there will be a delay in her getting the med filled, if prior Josem Kaufmann is needed)

## 2013-10-28 NOTE — Telephone Encounter (Signed)
Pt called to tell you she received her medication.  Pt ph [phone]

## 2013-11-17 ENCOUNTER — Telehealth: Payer: Self-pay | Admitting: Family Medicine

## 2013-11-17 NOTE — Telephone Encounter (Signed)
Left message for patient to return my call.

## 2013-11-17 NOTE — Telephone Encounter (Signed)
Spoke with patient and she wanted to know if you would be willing to send her to a foot doctor?

## 2013-11-17 NOTE — Telephone Encounter (Signed)
We had discussed that treating her fungus was elective, and she did NOT have to treat it.  The medication needs to be taken for 3 months, so if she isn't tolerating (or sporadic in use), might not get cure/good results.  She can keep trying for the next few days, and if consistently having problems/side effect, then stop the med.  She can always come for OV for eval for concern as well

## 2013-11-17 NOTE — Telephone Encounter (Signed)
Ok to refer to [facility], if she desires

## 2013-11-17 NOTE — Telephone Encounter (Signed)
Pt states the Terbinafine that you gave her for her feet is causing her feet to swell & itch.  She didn't take it yesterday and they didn't swell or itch.  What should she do?

## 2013-11-18 NOTE — Telephone Encounter (Signed)
Patient referred to [facility], will be seeing Dr.Egerton 12/10/13 @ 10:30am. Patient aware.

## 2013-11-24 ENCOUNTER — Other Ambulatory Visit: Payer: 59

## 2013-11-24 DIAGNOSIS — Z79899 Other long term (current) drug therapy: Secondary | ICD-10-CM

## 2013-11-25 ENCOUNTER — Other Ambulatory Visit: Payer: Self-pay

## 2013-12-10 ENCOUNTER — Telehealth: Payer: Self-pay | Admitting: *Deleted

## 2013-12-10 ENCOUNTER — Encounter: Payer: Self-pay | Admitting: Podiatrist

## 2013-12-10 ENCOUNTER — Ambulatory Visit (INDEPENDENT_AMBULATORY_CARE_PROVIDER_SITE_OTHER): Payer: 59 | Admitting: Podiatrist

## 2013-12-10 VITALS — BP 140/73 | HR 52 | Resp 18

## 2013-12-10 DIAGNOSIS — B351 Tinea unguium: Secondary | ICD-10-CM

## 2013-12-10 MED ORDER — TAVABOROLE 5 % EX SOLN: 1.0000 [drp] | CUTANEOUS | Status: DC

## 2013-12-10 NOTE — Progress Notes (Signed)
   Subjective:    Patient ID: Sylvia Gutierrez, female    DOB: [birth year], 68 y.o.   MRN: [id number]  HPI  I HAVE SOME FUNGAL TOENAILS AND HAS BEEN GOING ON FOR ABOUT 3 YEARS AND THICK AND DISCOLORED AND HAVE NOT TRIED ANYTHING OVER THE COUNTER    Review of Systems  All other systems reviewed and are negative.      Objective:   Physical Exam Patient is awake, alert, and oriented x 3.  In no acute distress.  Vascular status is intact with palpable pedal pulses at 2/4 DP and PT bilateral and capillary refill time within normal limits. Neurological sensation is also intact bilaterally via Semmes Weinstein monofilament at 5/5 sites. Light touch, vibratory sensation, Achilles tendon reflex is intact. Dermatological exam reveals skin color, turger and texture as normal. No open lesions present.  Musculature intact with dorsiflexion, plantarflexion, inversion, eversion.  Left hallux nail is mycotic and dystrophic.  Remainder of nails are ok.     Assessment & Plan:  Mycotic nail  Plan:  lamisil she had a reaction with and could not tolerate.  i will start her on kerydin.  i will take a sample of the nail and send to [facility]-- may be a candidate for diflucan depending upon the culture results..  Will notifiy her of the culutre results

## 2013-12-10 NOTE — Telephone Encounter (Signed)
Left 1st toenail fragment sent to [facility] [phone] for definitive diagnosis for fungal element.

## 2013-12-10 NOTE — Patient Instructions (Signed)

## 2013-12-27 ENCOUNTER — Other Ambulatory Visit: Payer: Self-pay

## 2014-01-21 ENCOUNTER — Encounter: Payer: Self-pay | Admitting: Podiatrist

## 2014-02-14 ENCOUNTER — Ambulatory Visit
Admission: RE | Admit: 2014-02-14 | Discharge: 2014-02-14 | Disposition: A | Discharge status: Home / Self Care | Payer: PRIVATE HEALTH INSURANCE | Source: Ambulatory Visit | Attending: Family Medicine | Admitting: Family Medicine

## 2014-02-14 ENCOUNTER — Encounter: Payer: Self-pay | Admitting: Family Medicine

## 2014-02-14 ENCOUNTER — Ambulatory Visit (INDEPENDENT_AMBULATORY_CARE_PROVIDER_SITE_OTHER): Payer: 59 | Admitting: Family Medicine

## 2014-02-14 VITALS — BP 130/70 | HR 72 | Ht 62.0 in | Wt 192.0 lb

## 2014-02-14 DIAGNOSIS — I10 Essential (primary) hypertension: Secondary | ICD-10-CM

## 2014-02-14 DIAGNOSIS — E7800 Pure hypercholesterolemia, unspecified: Secondary | ICD-10-CM

## 2014-02-14 DIAGNOSIS — Z0000 Encounter for general adult medical examination without abnormal findings: Secondary | ICD-10-CM

## 2014-02-14 DIAGNOSIS — Z23 Encounter for immunization: Secondary | ICD-10-CM

## 2014-02-14 DIAGNOSIS — E669 Obesity, unspecified: Secondary | ICD-10-CM

## 2014-02-14 DIAGNOSIS — Z8249 Family history of ischemic heart disease and other diseases of the circulatory system: Secondary | ICD-10-CM

## 2014-02-14 DIAGNOSIS — B351 Tinea unguium: Secondary | ICD-10-CM

## 2014-02-14 DIAGNOSIS — R51 Headache: Secondary | ICD-10-CM

## 2014-02-14 DIAGNOSIS — M25562 Pain in left knee: Secondary | ICD-10-CM

## 2014-02-14 DIAGNOSIS — R519 Headache, unspecified: Secondary | ICD-10-CM

## 2014-02-14 LAB — CBC WITH DIFFERENTIAL/PLATELET
Basophils Absolute: 0 10*3/uL (ref 0.0–0.1)
Basophils Relative: 0 % (ref 0–1)
Eosinophils Absolute: 0.1 10*3/uL (ref 0.0–0.7)
Eosinophils Relative: 1 % (ref 0–5)
HCT: 38.2 % (ref 36.0–46.0)
Hemoglobin: 12.6 g/dL (ref 12.0–15.0)
Lymphocytes Relative: 33 % (ref 12–46)
Lymphs Abs: 2.2 10*3/uL (ref 0.7–4.0)
MCH: 28.6 pg (ref 26.0–34.0)
MCHC: 33 g/dL (ref 30.0–36.0)
MCV: 86.8 fL (ref 78.0–100.0)
Monocytes Absolute: 0.4 10*3/uL (ref 0.1–1.0)
Monocytes Relative: 6 % (ref 3–12)
Neutro Abs: 4.1 10*3/uL (ref 1.7–7.7)
Neutrophils Relative %: 60 % (ref 43–77)
Platelets: 235 10*3/uL (ref 150–400)
RBC: 4.4 MIL/uL (ref 3.87–5.11)
RDW: 15.3 % (ref 11.5–15.5)
WBC: 6.8 10*3/uL (ref 4.0–10.5)

## 2014-02-14 LAB — LIPID PANEL
Cholesterol: 176 mg/dL (ref 0–200)
HDL: 47 mg/dL (ref >39)
LDL Cholesterol: 104 mg/dL — ABNORMAL HIGH (ref 0–99)
Total CHOL/HDL Ratio: 3.7 Ratio
Triglycerides: 125 mg/dL (ref <150)
VLDL: 25 mg/dL (ref 0–40)

## 2014-02-14 LAB — POCT URINALYSIS DIPSTICK
Bilirubin, UA: NEGATIVE
Glucose, UA: NEGATIVE
Ketones, UA: NEGATIVE
Nitrite, UA: NEGATIVE
Protein, UA: NEGATIVE
Spec Grav, UA: 1.015
Urobilinogen, UA: NEGATIVE
pH, UA: 5

## 2014-02-14 LAB — COMPREHENSIVE METABOLIC PANEL
ALT: 11 U/L (ref 0–35)
AST: 17 U/L (ref 0–37)
Albumin: 4.2 g/dL (ref 3.5–5.2)
Alkaline Phosphatase: 70 U/L (ref 39–117)
BUN: 19 mg/dL (ref 6–23)
CO2: 32 mEq/L (ref 19–32)
Calcium: 9.7 mg/dL (ref 8.4–10.5)
Chloride: 101 mEq/L (ref 96–112)
Creat: 0.96 mg/dL (ref 0.50–1.10)
Glucose, Bld: 96 mg/dL (ref 70–99)
Potassium: 3.8 mEq/L (ref 3.5–5.3)
Sodium: 141 mEq/L (ref 135–145)
Total Bilirubin: 0.6 mg/dL (ref 0.2–1.2)
Total Protein: 7 g/dL (ref 6.0–8.3)

## 2014-02-14 LAB — TSH: TSH: 2.283 u[IU]/mL (ref 0.350–4.500)

## 2014-02-14 NOTE — Progress Notes (Signed)
Chief Complaint  Patient presents with  . Annual Exam    fasting annual exam with pelvic. UA showed 2+ leuks and trace blood. She does state that the right side of her head has been "thumping," and she thinks she needs an MRI. Also left knee has been locking up and she thinks she should have a cortisone injection. Pt decllined flu vaccine today.    Sylvia Gutierrez is a 68 y.o. female who presents for a complete physical and 6 month f/u on her chronic problems/med check. Her concerns today include:  She wants an MRI of her brain.  She had thumping headache on the right side of her head yesterday. Pain was at her right temple.  She has been having headaches at right temple off and on for a few years--had been mild, but has gotten worse.  She wants one mainly due to her mother's h/o aneurysm.  Left knee pain.  She is wanting a cortisone shot.  Pain is constant--whether she is active or not.  She has more pain with doing the elliptical. No pain when on the exercise bike.  She finds that it has just starting locking up on her when walking.  Denies any knee swelling.  Hypertension follow-up: BP's haven't been checked elsewhere very often--just rare.  "Perfect" when nurse came to her house (?August).  Denies dizziness, headaches, chest pain. Denies side effects of medications. She has some ankle swelling, which gets worse throughout the day (R>L).  H/o ankle injury (broken bone) in past.  She continues to try and follow a low sodium diet (no processed foods; frozen vegetables instead of canned).  Hyperlipidemia follow-up: Patient is reportedly trying to following a low-fat, low cholesterol diet. Compliant with medications and denies medication side effects.   She denies any urinary symptoms (some abnormalities noted on urine dip today).  Immunization History  Administered Date(s) Administered  . Influenza, High Dose Seasonal PF 03/04/2013  . PPD Test 09/01/2013  . Pneumococcal Polysaccharide-23 11/28/2010   . Td 05/13/2005  . Tdap 06/20/2011  . Zoster 01/10/2010   Last Pap smear: 07/04/08  Last mammogram: 08/2013  Last colonoscopy: 06/14/09 (due again 06/2014) Last DEXA: pt reports having had normal DEXA scan (not in our records, so prior to 2010)--she believes she has paperwork at home. Dentist: Over a year ago (when she had teeth pulled, and partials made).  She has 5 teeth left.  Ophtho: 01/2013, due now Exercise: gym 3x/week, silver sneaker class and cardio x 1 hour (elliptical, uses arms).  Walk on the days she doesn't go to the gym (1 mile, 2x/week) She does not have Living Will or Healthcare Power of Attorney  Past Medical History  Diagnosis Date  . Hypertension   . Diverticulosis   . Hemorrhoids   . Anemia   . Hyperlipidemia   . Colon polyp     colonoscopy 06/2009, due again 2016  . Onychomycosis     Past Surgical History  Procedure Laterality Date  . Abdominal hysterectomy      for bleeding/benign  . Carpal tunnel release Right     History   Social History  . Marital Status: Married    Spouse Name: N/A    Number of Children: 4  . Years of Education: N/A   Occupational History  . MACHINE OPERATOR--Retired     Social History Main Topics  . Smoking status: Former Smoker    Quit date: 05/13/2005  . Smokeless tobacco: Never Used  . Alcohol Use: No  .   Drug Use: No  . Sexual Activity: Not Currently   Other Topics Concern  . Not on file   Social History Narrative   Retired 2011.  Separated from her husband 2011, now divorced.  Lives alone.  Has 4 children, all in [place].  7 grandchildren, 2 great grandchildren    Family History  Problem Relation Age of Onset  . Hypertension Mother   . Cancer Father     colon (78's)  . Colon cancer Father   . Cancer Sister     throat  . Hypertension Brother   . Diabetes Brother   . Breast cancer Neg Hx   . Heart disease Neg Hx   . Hypertension Brother   . Hypertension Brother   . Cancer Brother     prostate    Outpatient Encounter Prescriptions as of 02/14/2014  Medication Sig Note  . aspirin 81 MG tablet Take 81 mg by mouth daily.     Marland Kitchen atenolol (TENORMIN) 25 MG tablet Take 1 tablet (25 mg total) by mouth daily.   . cholecalciferol (VITAMIN D) 1000 UNITS tablet Take 1,000 Units by mouth daily.   . fish oil-omega-3 fatty acids 1000 MG capsule Take 2 g by mouth daily.     Marland Kitchen lisinopril-hydrochlorothiazide (PRINZIDE,ZESTORETIC) 20-12.5 MG per tablet Take 1 tablet by mouth daily.   . pravastatin (PRAVACHOL) 40 MG tablet Take 1 tablet (40 mg total) by mouth daily.   . Tavaborole (KERYDIN) 5 % SOLN Apply 1 drop topically 1 day or 1 dose. Apply 1 drop to the toenail daily.   . [DISCONTINUED] terbinafine (LAMISIL) 250 MG tablet Take 1 tablet (250 mg total) by mouth daily. 02/14/2014: Stopped due to side effects (only took a few days)     No Known Allergies  ROS: The patient denies fever, vision changes, decreased hearing, ear pain, sore throat, breast concerns, chest pain, palpitations, dizziness, syncope, dyspnea on exertion, nausea, vomiting, diarrhea, constipation, abdominal pain, melena, hematochezia, indigestion/heartburn, hematuria, incontinence, dysuria, vaginal bleeding, discharge, odor or itch, genital lesions, weakness, tremor, suspicious skin lesions, depression, anxiety, abnormal bleeding/bruising, or enlarged lymph nodes.  + L knee pain per HPI. She lost 14 pounds in the last 6 months (reportedly due to decreased appetite over the summer compared to the winter).  PHYSICAL EXAM:  BP 148/80  Pulse 72  Ht 5' 2" (1.575 m)  Wt 192 lb (87.091 kg)  BMI 35.11 kg/m2 130/70 on repeat by MD  General Appearance:  Alert, cooperative, no distress, appears stated age   Head:  Normocephalic, without obvious abnormality, atraumatic.  nontender at temporal arteries, temporalis muscle (no headache at present)  Eyes:  PERRL, conjunctiva/corneas clear, EOM's intact, fundi  benign   Ears:  Normal TM's and  external ear canals   Nose:  Nares normal, mucosa mildly edematous, no drainage or sinus tenderness   Throat:  Mucosa, and tongue normal; Dentures   Neck:  Supple, no lymphadenopathy; thyroid: no enlargement/tenderness/nodules; no carotid  bruit or JVD   Back:  Spine nontender, no curvature, ROM normal, no CVA tenderness   Lungs:  Clear to auscultation bilaterally without wheezes, rales or ronchi; respirations unlabored   Chest Wall:  No tenderness or deformity   Heart:  Regular rate and rhythm, S1 and S2 normal, no murmur, rub  or gallop   Breast Exam:  No tenderness, masses, or nipple discharge or inversion. No axillary lymphadenopathy   Abdomen:  Soft, non-tender, nondistended, normoactive bowel sounds,  no masses, no hepatosplenomegaly  Genitalia:  Normal external genitalia without lesions. BUS and vagina normal; uterus surgically absent. No adnexal masses or tenderness. Pap not performed   Rectal:  Normal tone, no masses or tenderness; guaiac negative stool   Extremities:  No clubbing, cyanosis or edema. Thickened onychomycotic great toenails bilaterally, not ingrowing.  Left knee--no effusion, warmth, crepitus. No pain with valgus/varus stress, negative Lachman. Normal knee exam. No pitting edema at ankles.  Pulses:  2+ and symmetric all extremities   Skin:  Skin color, texture, turgor normal, no rashes or lesions   Lymph nodes:  Cervical, supraclavicular, and axillary nodes normal   Neurologic:  CNII-XII intact, normal strength, sensation and gait; reflexes 2+ and symmetric throughout   Psych: Normal mood, affect, hygiene and grooming.    ASSESSMENT/PLAN:  Annual physical exam - Plan: Visual acuity screening, POCT Urinalysis Dipstick, Comprehensive metabolic panel, CBC with Differential, TSH  Essential hypertension, benign - controlled - Plan: Comprehensive metabolic panel  Pure hypercholesterolemia - Plan: Lipid panel, Comprehensive metabolic panel  Obesity (BMI  30-39.9)  Worsening headaches - Plan: MR Brain Wo Contrast, CBC with Differential, Sedimentation Rate, MR Brain W Wo Contrast  Family history of cerebral aneurysm - Plan: MR Brain Wo Contrast, MR Brain W Wo Contrast  Left knee pain - likely OA.  Check x-ray.  Tylenol Arthritis prn; avoid elliptical, hills, stairs.  continue exercise bike, weight loss. f/u for injection if persists - Plan: DG Knee Complete 4 Views Left  Need for prophylactic vaccination against Streptococcus pneumoniae (pneumococcus) - Plan: Pneumococcal conjugate vaccine 13-valent  Need for influenza vaccination - Plan: Flu vaccine HIGH DOSE PF (Fluzone Tri High dose)  Onychomycosis of toenail - intolerant of lamisil.  on topical therapy from podiatrist  MRI brain--dx right sided headaches; family h/o aneurysm  Left knee pain--check x-ray.  Avoid elliptical and use bicycle instead (less weight-bearing).  Return for cortisone shot if ongoing pain/problems.  If ongoing locking, may need MRI.  Given paperwork for living will and healthcare power of attorney  Discussed monthly self breast exams and yearly mammograms; at least 30 minutes of aerobic activity at least 5 days/week; proper sunscreen use reviewed; healthy diet, including goals of calcium and vitamin D intake and alcohol recommendations (less than or equal to 1 drink/day) reviewed; regular seatbelt use; changing batteries in smoke detectors. Immunization recommendations discussed--flu shot and [id number] given today. Risks/side effects were reviewed. Colonoscopy recommendations reviewed--due again 06/2014. Hemosure kit given  She states she does not need any refills at this time.  Look to see if you have report from bone density at home (and if you just have the date, let us know the date and location, and sign a release form, so we can request result).  If you cannot find this information, we probably should schedule a bone density. If it showed any thinning on prior  exam, then we should schedule a follow-up (if it was completely normal, and done at age 41 or later, then might not need to recheck).

## 2014-02-14 NOTE — Patient Instructions (Addendum)
  HEALTH MAINTENANCE RECOMMENDATIONS:  It is recommended that you get at least 30 minutes of aerobic exercise at least 5 days/week (for weight loss, you may need as much as 60-90 minutes). This can be any activity that gets your heart rate up. This can be divided in 10-15 minute intervals if needed, but try and build up your endurance at least once a week.  Weight bearing exercise is also recommended twice weekly.  Eat a healthy diet with lots of vegetables, fruits and fiber.  "Colorful" foods have a lot of vitamins (ie green vegetables, tomatoes, red peppers, etc).  Limit sweet tea, regular sodas and alcoholic beverages, all of which has a lot of calories and sugar.  Up to 1 alcoholic drink daily may be beneficial for women (unless trying to lose weight, watch sugars).  Drink a lot of water.  Calcium recommendations are 1200-1500 mg daily (1500 mg for postmenopausal women or women without ovaries), and vitamin D 1000 IU daily.  This should be obtained from diet and/or supplements (vitamins), and calcium should not be taken all at once, but in divided doses.  Monthly self breast exams and yearly mammograms for women over the age of 41 is recommended.  Sunscreen of at least SPF 30 should be used on all sun-exposed parts of the skin when outside between the hours of 10 am and 4 pm (not just when at beach or pool, but even with exercise, golf, tennis, and yard work!)  Use a sunscreen that says "broad spectrum" so it covers both UVA and UVB rays, and make sure to reapply every 1-2 hours.  Remember to change the batteries in your smoke detectors when changing your clock times in the spring and fall.  Use your seat belt every time you are in a car, and please drive safely and not be distracted with cell phones and texting while driving.   Please schedule appointment with dentist.  Please continue healthy, low sodium, lowfat, low cholesterol diet.  Try and continue with weight loss efforts.  Go to  [facility] at your convenience to have x-rays of your knee.  We need to schedule the MRI of the brain, and will be calling you with your appointment.  Look to see if you have report from bone density at home (and if you just have the date, let us know the date and location, and sign a release form, so we can request result).  If you cannot find this information, we probably should schedule a bone density. If it showed any thinning on prior exam, then we should schedule a follow-up (if it was completely normal, and done at age 81 or later, then might not need to recheck).  Periodically check your blood pressure, write them down.  Return to see [place] sooner if blood pressures are consistently >140/90 (goal is <135/85).

## 2014-02-15 LAB — SEDIMENTATION RATE: Sed Rate: 15 mm/hr (ref 0–22)

## 2014-02-16 ENCOUNTER — Ambulatory Visit
Admission: RE | Admit: 2014-02-16 | Discharge: 2014-02-16 | Disposition: A | Discharge status: Home / Self Care | Payer: PRIVATE HEALTH INSURANCE | Source: Ambulatory Visit | Attending: Family Medicine | Admitting: Family Medicine

## 2014-02-16 ENCOUNTER — Other Ambulatory Visit: Payer: Self-pay | Admitting: Family Medicine

## 2014-02-16 ENCOUNTER — Telehealth: Payer: Self-pay | Admitting: *Deleted

## 2014-02-16 ENCOUNTER — Other Ambulatory Visit: Payer: Medicare Other

## 2014-02-16 DIAGNOSIS — Z8249 Family history of ischemic heart disease and other diseases of the circulatory system: Secondary | ICD-10-CM

## 2014-02-16 DIAGNOSIS — R519 Headache, unspecified: Secondary | ICD-10-CM

## 2014-02-16 DIAGNOSIS — R51 Headache: Principal | ICD-10-CM

## 2014-02-16 MED ORDER — GADOBENATE DIMEGLUMINE 529 MG/ML IV SOLN: 18.0000 mL | Freq: Once | INTRAVENOUS | Status: AC | PRN

## 2014-02-16 NOTE — Telephone Encounter (Signed)
Her options also include trying an open MRI.  If she premedicates to help her relax, the meds are sedating, and she needs someone else to drive her there and home.  Okay for alprazolam 0.25.  Take one tablet 30 minutes prior to procedure, and can repeat a dose when at the facility if not high enough of a dose  (I don't have a record of her taking meds like this in the past, so starting on a low dose.  If she tells you that she has had it in the past, and tolerated higher doses, let me know and we can change).  Okay for #2, no refill

## 2014-02-16 NOTE — Telephone Encounter (Signed)
Patient went for MRI this morning and panicked. MRI was canceled. They told her to call and see if something could be phoned in for her and then they would reschedule.

## 2014-02-21 MED ORDER — ALPRAZOLAM 0.25 MG PO TABS: 0.2500 mg | ORAL_TABLET | ORAL | Status: DC

## 2014-02-21 NOTE — Telephone Encounter (Signed)
Patient rescheduled for open MRI for 03/02/14 @ 1:00pm, she also wants the alprazolam to help her relax and she will bring a driver. Sent rx to pharmacy, [facility].

## 2014-02-21 NOTE — Telephone Encounter (Signed)
Left message for patient to return my call.

## 2014-02-25 ENCOUNTER — Encounter: Payer: Self-pay | Admitting: Family Medicine

## 2014-02-25 ENCOUNTER — Ambulatory Visit (INDEPENDENT_AMBULATORY_CARE_PROVIDER_SITE_OTHER): Payer: 59 | Admitting: Family Medicine

## 2014-02-25 VITALS — BP 148/80 | HR 72 | Ht 62.0 in | Wt 191.0 lb

## 2014-02-25 DIAGNOSIS — M25562 Pain in left knee: Secondary | ICD-10-CM

## 2014-02-25 DIAGNOSIS — M1712 Unilateral primary osteoarthritis, left knee: Secondary | ICD-10-CM

## 2014-02-25 MED ORDER — LIDOCAINE HCL 2 % IJ SOLN
4.0000 mL | Freq: Once | INTRAMUSCULAR | Status: AC
  Administered 2014-02-25: 80 mg

## 2014-02-25 MED ORDER — BUPIVACAINE HCL (PF) 0.5 % IJ SOLN
4.0000 mL | Freq: Once | INTRAMUSCULAR | Status: AC
  Administered 2014-02-25: 4 mL

## 2014-02-25 MED ORDER — TRIAMCINOLONE ACETONIDE 40 MG/ML IJ SUSP
30.0000 mg | Freq: Once | INTRAMUSCULAR | Status: AC
  Administered 2014-02-25: 30 mg via INTRA_ARTICULAR

## 2014-02-25 NOTE — Progress Notes (Signed)
Chief Complaint  Patient presents with  . Knee Pain    desires left knee injection.   Patient presents requesting cortisone injection to her left knee.  She has spasms of severe pain in the left knee throughout the day, usually when walking.  She has been using exercise bike at the [facility] (bothered her when she used it for an hour), and continues to lose weight.   See PMH/PSH/SH  Outpatient Encounter Prescriptions as of 02/25/2014  Medication Sig  . ALPRAZolam (XANAX) 0.25 MG tablet Take 1 tablet (0.25 mg total) by mouth as directed. Take 30 minutes prior to MRI, and second tablet if needed.  Marland Kitchen aspirin 81 MG tablet Take 81 mg by mouth daily.    Marland Kitchen atenolol (TENORMIN) 25 MG tablet Take 1 tablet (25 mg total) by mouth daily.  . cholecalciferol (VITAMIN D) 1000 UNITS tablet Take 1,000 Units by mouth daily.  . fish oil-omega-3 fatty acids 1000 MG capsule Take 2 g by mouth daily.    Marland Kitchen lisinopril-hydrochlorothiazide (PRINZIDE,ZESTORETIC) 20-12.5 MG per tablet Take 1 tablet by mouth daily.  . pravastatin (PRAVACHOL) 40 MG tablet Take 1 tablet (40 mg total) by mouth daily.  . Tavaborole (KERYDIN) 5 % SOLN Apply 1 drop topically 1 day or 1 dose. Apply 1 drop to the toenail daily.  . [EXPIRED] bupivacaine (MARCAINE) 0.5 % injection 4 mL   . [EXPIRED] lidocaine (XYLOCAINE) 2 % (with pres) injection 80 mg   . [EXPIRED] triamcinolone acetonide (KENALOG-40) injection 30 mg    No Known Allergies  ROS:  No fevers, chills, swelling, rash, headaches, dizziness, chest pain, shortness of breath or URI symptoms  PHYSICAL EXAM: BP 148/80  Pulse 72  Ht 5\' 2"  (1.575 m)  Wt 191 lb (86.637 kg)  BMI 34.93 kg/m2 Well developed, pleasant female in no distress Left knee--without effusion, warmth  X-rays of left knee from 02/14/14 showed:  Four views of the left these tube VT. No acute fracture or  subluxation. Mild narrowing of medial joint compartment. Minimal  spurring of medial femoral condyle and medial  tibial plateau. Mild  narrowing of patellofemoral joint space.  IMPRESSION:  No acute fracture or subluxation. Mild osteoarthritic changes.  Procedure:  Verbal consent was obtained after reviewed risks/alternative treatments. She wishes to proceed with injection. Left medial knee was cleansed with betadine, then injected with 30mg  of kenalog, along with 4cc each of Marcaine and 2% lido without epi.  Patient tolerated the procedure well, without adverse effects.  She noted significant improvement in pain with standing/walking after injection.  ASSESSMENT/PLAN:  Primary osteoarthritis of left knee - Plan: PR DRAIN/INJECT LARGE JOINT/BURSA  Left knee pain - Plan: bupivacaine (MARCAINE) 0.5 % injection 4 mL, lidocaine (XYLOCAINE) 2 % (with pres) injection 80 mg, triamcinolone acetonide (KENALOG-40) injection 30 mg   Discussed proper bike settings; continue weight loss.  Reviewed expected course of improvement s/p injection today.  F/u prn

## 2014-02-25 NOTE — Patient Instructions (Signed)
Knee Injection Joint injections are shots. Your caregiver will place a needle into your knee joint. The needle is used to put medicine into the joint. These shots can be used to help treat different painful knee conditions such as osteoarthritis, bursitis, local flare-ups of rheumatoid arthritis, and pseudogout. Anti-inflammatory medicines such as corticosteroids and anesthetics are the most common medicines used for joint and soft tissue injections.  PROCEDURE  The skin over the kneecap will be cleaned with an antiseptic solution.  Your caregiver will inject a small amount of a local anesthetic (a medicine like Novocaine) just under the skin in the area that was cleaned.  After the area becomes numb, a second injection is done. This second injection usually includes an anesthetic and an anti-inflammatory medicine called a steroid or cortisone. The needle is carefully placed in between the kneecap and the knee, and the medicine is injected into the joint space.  After the injection is done, the needle is removed. Your caregiver may place a bandage over the injection site. The whole procedure takes no more than a couple of minutes. BEFORE THE PROCEDURE  Wash all of the skin around the entire knee area. Try to remove any loose, scaling skin. There is no other specific preparation necessary unless advised otherwise by your caregiver. LET YOUR CAREGIVER KNOW ABOUT:   Allergies.  Medications taken including herbs, eye drops, over the counter medications, and creams.  Use of steroids (by mouth or creams).  Possible pregnancy, if applicable.  Previous problems with anesthetics or Novocaine.  History of blood clots (thrombophlebitis).  History of bleeding or blood problems.  Previous surgery.  Other health problems. RISKS AND COMPLICATIONS Side effects from cortisone shots are rare. They include:   Slight bruising of the skin.  Shrinkage of the normal fatty tissue under the skin where  the shot was given.  Increase in pain after the shot.  Infection.  Weakening of tendons or tendon rupture.  Allergic reaction to the medicine.  Diabetics may have a temporary increase in their blood sugar after a shot.  Cortisone can temporarily weaken the immune system. While receiving these shots, you should not get certain vaccines. Also, avoid contact with anyone who has chickenpox or measles. Especially if you have never had these diseases or have not been previously immunized. Your immune system may not be strong enough to fight off the infection while the cortisone is in your system. AFTER THE PROCEDURE   You can go home after the procedure.  You may need to put ice on the joint 15-20 minutes every 3 or 4 hours until the pain goes away.  You may need to put an elastic bandage on the joint. HOME CARE INSTRUCTIONS   Only take over-the-counter or prescription medicines for pain, discomfort, or fever as directed by your caregiver.  You should avoid stressing the joint. Unless advised otherwise, avoid activities that put a lot of pressure on a knee joint, such as:  Jogging.  Bicycling.  Recreational climbing.  Hiking.  Laying down and elevating the leg/knee above the level of your heart can help to minimize swelling. SEEK MEDICAL CARE IF:   You have repeated or worsening swelling.  There is drainage from the puncture area.  You develop red streaking that extends above or below the site where the needle was inserted. SEEK IMMEDIATE MEDICAL CARE IF:   You develop a fever.  You have pain that gets worse even though you are taking pain medicine.  The area is   red and warm, and you have trouble moving the joint. MAKE SURE YOU:   Understand these instructions.  Will watch your condition.  Will get help right away if you are not doing well or get worse. Document Released: 07/21/2006 Document Revised: 07/22/2011 Document Reviewed: 04/17/2007 ExitCare Patient  Information 2015 [employer]. This information is not intended to replace advice given to you by your health care provider. Make sure you discuss any questions you have with your health care provider.  

## 2014-03-01 ENCOUNTER — Telehealth: Payer: Self-pay | Admitting: Internal Medicine

## 2014-03-01 DIAGNOSIS — Z8249 Family history of ischemic heart disease and other diseases of the circulatory system: Secondary | ICD-10-CM

## 2014-03-01 NOTE — Telephone Encounter (Signed)
Then see if there is someone that can help you

## 2014-03-01 NOTE — Telephone Encounter (Signed)
MRA head w/o Contrast did get approved til 04/15/14 Auth # [id number]. I have called Baker Janus @ [facility] and left a message with Josem Kaufmann # approve for it to get done tomorrow

## 2014-03-01 NOTE — Telephone Encounter (Signed)
Baker Janus from [place] called stating that with family History of Celebral aneurysm they would need to do MRA Head without in addition to the MRI for them them to show celebral aneurysm. They can possible add it to tomorrow appt. but a prior Josem Kaufmann will have to be done and i am not sure i will have time to do the prior auth today

## 2014-03-02 ENCOUNTER — Ambulatory Visit: Admission: RE | Admit: 2014-03-02 | Payer: PRIVATE HEALTH INSURANCE | Source: Ambulatory Visit

## 2014-03-02 ENCOUNTER — Ambulatory Visit
Admission: RE | Admit: 2014-03-02 | Discharge: 2014-03-02 | Disposition: A | Discharge status: Home / Self Care | Payer: PRIVATE HEALTH INSURANCE | Source: Ambulatory Visit | Attending: Family Medicine | Admitting: Family Medicine

## 2014-03-02 ENCOUNTER — Other Ambulatory Visit: Payer: Self-pay | Admitting: Family Medicine

## 2014-03-02 DIAGNOSIS — Z8249 Family history of ischemic heart disease and other diseases of the circulatory system: Secondary | ICD-10-CM

## 2014-03-02 MED ORDER — GADOBENATE DIMEGLUMINE 529 MG/ML IV SOLN
18.0000 mL | Freq: Once | INTRAVENOUS | Status: AC | PRN
  Administered 2014-03-02: 18 mL via INTRAVENOUS

## 2014-03-14 ENCOUNTER — Encounter: Payer: Self-pay | Admitting: Family Medicine

## 2014-03-21 ENCOUNTER — Other Ambulatory Visit: Payer: Self-pay | Admitting: Family Medicine

## 2014-08-29 ENCOUNTER — Ambulatory Visit (INDEPENDENT_AMBULATORY_CARE_PROVIDER_SITE_OTHER): Payer: Medicare Other | Admitting: Family Medicine

## 2014-08-29 ENCOUNTER — Encounter: Payer: Self-pay | Admitting: Family Medicine

## 2014-08-29 DIAGNOSIS — E669 Obesity, unspecified: Secondary | ICD-10-CM | POA: Diagnosis not present

## 2014-08-29 DIAGNOSIS — E7800 Pure hypercholesterolemia, unspecified: Secondary | ICD-10-CM

## 2014-08-29 DIAGNOSIS — I10 Essential (primary) hypertension: Secondary | ICD-10-CM | POA: Diagnosis not present

## 2014-08-29 DIAGNOSIS — M1712 Unilateral primary osteoarthritis, left knee: Secondary | ICD-10-CM | POA: Diagnosis not present

## 2014-08-29 LAB — LIPID PANEL
Cholesterol: 161 mg/dL (ref 0–200)
HDL: 52 mg/dL (ref >=46)
LDL Cholesterol: 91 mg/dL (ref 0–99)
Total CHOL/HDL Ratio: 3.1 Ratio
Triglycerides: 88 mg/dL (ref <150)
VLDL: 18 mg/dL (ref 0–40)

## 2014-08-29 LAB — HEPATIC FUNCTION PANEL
ALT: 13 U/L (ref 0–35)
AST: 18 U/L (ref 0–37)
Albumin: 3.9 g/dL (ref 3.5–5.2)
Alkaline Phosphatase: 63 U/L (ref 39–117)
Bilirubin, Direct: 0.1 mg/dL (ref 0.0–0.3)
Indirect Bilirubin: 0.6 mg/dL (ref 0.2–1.2)
Total Bilirubin: 0.7 mg/dL (ref 0.2–1.2)
Total Protein: 6.7 g/dL (ref 6.0–8.3)

## 2014-08-29 MED ORDER — PRAVASTATIN SODIUM 40 MG PO TABS: 40.0000 mg | ORAL_TABLET | Freq: Every day | ORAL | Status: DC

## 2014-08-29 MED ORDER — ATENOLOL 25 MG PO TABS: 25.0000 mg | ORAL_TABLET | Freq: Every day | ORAL | Status: DC

## 2014-08-29 MED ORDER — LISINOPRIL-HYDROCHLOROTHIAZIDE 20-12.5 MG PO TABS: 1.0000 | ORAL_TABLET | Freq: Every day | ORAL | Status: DC

## 2014-08-29 NOTE — Patient Instructions (Signed)
Continue to try and lose weight. Try and follow a low sodium diet, and periodically check blood pressure elsewhere.  Goal blood pressure is <130/80.  If consistently 140/90 or higher, please return sooner than your physical.  Continue your current medications--we will be in touch with your lab results within the week.

## 2014-08-29 NOTE — Progress Notes (Signed)
Chief Complaint  Patient presents with  . Hyperlipidemia    fasting med check. No concerns.   Hypertension follow-up: BP's are occasionally checked elsewhere ([facility]), only occasionally, but reports machine says "excellent".  Denies dizziness, headaches, chest pain. Denies side effects of medications. She still has some ankle swelling, which gets worse throughout the day (R>L). H/o ankle injury (broken bone) in past.She has been eating a lot of canned food that is given to her (free).  Hyperlipidemia follow-up: Patient is reportedly trying to following a low-fat, low cholesterol diet. Compliant with medications and denies medication side effects.   Left knee OA--she had a cortisone shot 6 months ago and is overall doing much better, especially with regular exercise. Still using the exercise bike 2x/wk--will be able to go to the gym more frequently over the summer. She lost 15# last year and has kept it off (no significant change in the last 6 months).  Onychomycosis resolved with the Kerydin drops.  PMH, PSH, SH reviewed  Outpatient Encounter Prescriptions as of 08/29/2014  Medication Sig  . aspirin 81 MG tablet Take 81 mg by mouth daily.    Marland Kitchen atenolol (TENORMIN) 25 MG tablet Take 1 tablet (25 mg total) by mouth daily.  . cholecalciferol (VITAMIN D) 1000 UNITS tablet Take 1,000 Units by mouth daily.  . fish oil-omega-3 fatty acids 1000 MG capsule Take 2 g by mouth daily.    Marland Kitchen lisinopril-hydrochlorothiazide (PRINZIDE,ZESTORETIC) 20-12.5 MG per tablet Take 1 tablet by mouth daily.  . pravastatin (PRAVACHOL) 40 MG tablet Take 1 tablet (40 mg total) by mouth daily.  . [DISCONTINUED] ALPRAZolam (XANAX) 0.25 MG tablet Take 1 tablet (0.25 mg total) by mouth as directed. Take 30 minutes prior to MRI, and second tablet if needed.  . [DISCONTINUED] atenolol (TENORMIN) 25 MG tablet Take 1 tablet by mouth  daily  . [DISCONTINUED] lisinopril-hydrochlorothiazide (PRINZIDE,ZESTORETIC) 20-12.5 MG per  tablet Take 1 tablet by mouth  daily  . [DISCONTINUED] pravastatin (PRAVACHOL) 40 MG tablet Take 1 tablet by mouth  daily  . [DISCONTINUED] Tavaborole (KERYDIN) 5 % SOLN Apply 1 drop topically 1 day or 1 dose. Apply 1 drop to the toenail daily.   No Known Allergies  ROS:  No further headaches. No URI symptoms, cough, shortness of breath, chest pain, nausea, vomiting, diarrhea, urinary complaints, rash, bleeding, bruising or other complaints except as noted in HPI.  PHYSICAL EXAM: BP 138/80 mmHg  Pulse 60  Ht 5\' 2"  (1.575 m)  Wt 190 lb 12.8 oz (86.546 kg)  BMI 34.89 kg/m2 132/74 on repeat by MD Well developed, pleasant female in no distress HEENT: PERRL, EOMI, conjunctiva clear Neck: no lymphadenopathy, thyromegaly or carotid bruit Heart: regular rate and rhythm Lungs: clear bilaterally Abdomen: soft, nontender, no organomegaly or mass Extremities: aircast on left ankle.  No edema Skin: no rash Neuro: alert and oriented. Normal gait, strength Psych: normal mood, affect, hygiene and grooming  ASSESSMENT/PLAN:  Pure hypercholesterolemia - Plan: Lipid panel, Hepatic function panel, pravastatin (PRAVACHOL) 40 MG tablet  Obesity (BMI 30-39.9) - encouraged further weight loss.  counseled re: diet, exercise  Primary osteoarthritis of left knee - improved wih exercise and cortisone shot 6 months ago.  weight loss recommended.  Essential hypertension, benign - controlled; reviewed low sodium diet; counseled re: diet, exercise; further weight loss recommended - Plan: atenolol (TENORMIN) 25 MG tablet, lisinopril-hydrochlorothiazide (PRINZIDE,ZESTORETIC) 20-12.5 MG per tablet   lft's and lipid today. Consider going to just once yearly labs if remain at goal and normal (  to be done yearly with her physicals in October)  25 min visit, more than 1/2 spent counseling re: diet, exercise, weight loss  6 months--CPE/med check

## 2014-08-30 ENCOUNTER — Encounter: Payer: Self-pay | Admitting: Family Medicine

## 2014-09-09 ENCOUNTER — Ambulatory Visit (INDEPENDENT_AMBULATORY_CARE_PROVIDER_SITE_OTHER): Payer: Medicare Other | Admitting: Medical

## 2014-09-09 ENCOUNTER — Telehealth: Payer: Self-pay | Admitting: Medical

## 2014-09-09 ENCOUNTER — Encounter: Payer: Self-pay | Admitting: Medical

## 2014-09-09 VITALS — BP 142/88 | HR 68 | Resp 15 | Wt 188.0 lb

## 2014-09-09 DIAGNOSIS — I10 Essential (primary) hypertension: Secondary | ICD-10-CM

## 2014-09-09 NOTE — Progress Notes (Signed)
Of note, she was scheduled for acute visit for elevated BPs.  Nurse took vitals.  Not sure what happened but patient ultimately left before I came in the room, told the front office she was done, and I never got to see her. We were running a little behind, nothing out of the ordinary.  The front office said she was pleasant when checking out.  I am not aware that she was upset, but I think there was a communication breakdown.    We called the patient back and left message as she hasn't returned call as of later that day.

## 2014-09-09 NOTE — Telephone Encounter (Signed)
We tried to call and left message, but please follow up with her.  She left with out being seen by me Friday morning.  I have no clue what happened.  We were running a little behind but not bad, I don't think she was upset based on the fact that she checked out saying she was done.  But there was obviously a communication breakdown somewhere.  Please inquire.

## 2014-09-12 NOTE — Telephone Encounter (Signed)
Called patient reached voice mail, [email or web address].

## 2015-01-13 DIAGNOSIS — Z1231 Encounter for screening mammogram for malignant neoplasm of breast: Secondary | ICD-10-CM | POA: Diagnosis not present

## 2015-01-13 LAB — HM MAMMOGRAPHY: HM Mammogram: NORMAL

## 2015-01-18 ENCOUNTER — Encounter: Payer: Self-pay | Admitting: *Deleted

## 2015-02-27 ENCOUNTER — Encounter: Payer: Self-pay | Admitting: Family Medicine

## 2015-02-27 ENCOUNTER — Ambulatory Visit (INDEPENDENT_AMBULATORY_CARE_PROVIDER_SITE_OTHER): Payer: Medicare Other | Admitting: Family Medicine

## 2015-02-27 VITALS — BP 134/74 | HR 52 | Resp 12 | Ht 63.0 in | Wt 191.4 lb

## 2015-02-27 DIAGNOSIS — Z1159 Encounter for screening for other viral diseases: Secondary | ICD-10-CM | POA: Diagnosis not present

## 2015-02-27 DIAGNOSIS — E669 Obesity, unspecified: Secondary | ICD-10-CM | POA: Diagnosis not present

## 2015-02-27 DIAGNOSIS — E7800 Pure hypercholesterolemia, unspecified: Secondary | ICD-10-CM | POA: Diagnosis not present

## 2015-02-27 DIAGNOSIS — Z0000 Encounter for general adult medical examination without abnormal findings: Secondary | ICD-10-CM | POA: Diagnosis not present

## 2015-02-27 DIAGNOSIS — I10 Essential (primary) hypertension: Secondary | ICD-10-CM

## 2015-02-27 DIAGNOSIS — Z5181 Encounter for therapeutic drug level monitoring: Secondary | ICD-10-CM | POA: Diagnosis not present

## 2015-02-27 DIAGNOSIS — B351 Tinea unguium: Secondary | ICD-10-CM | POA: Diagnosis not present

## 2015-02-27 DIAGNOSIS — Z23 Encounter for immunization: Secondary | ICD-10-CM

## 2015-02-27 LAB — LIPID PANEL
Cholesterol: 154 mg/dL (ref 125–200)
HDL: 51 mg/dL (ref >=46)
LDL Cholesterol: 84 mg/dL (ref <130)
Total CHOL/HDL Ratio: 3 Ratio (ref <=5.0)
Triglycerides: 95 mg/dL (ref <150)
VLDL: 19 mg/dL (ref <30)

## 2015-02-27 LAB — COMPREHENSIVE METABOLIC PANEL
ALT: 12 U/L (ref 6–29)
AST: 18 U/L (ref 10–35)
Albumin: 4.2 g/dL (ref 3.6–5.1)
Alkaline Phosphatase: 64 U/L (ref 33–130)
BUN: 17 mg/dL (ref 7–25)
CO2: 29 mmol/L (ref 20–31)
Calcium: 9.4 mg/dL (ref 8.6–10.4)
Chloride: 101 mmol/L (ref 98–110)
Creat: 0.89 mg/dL (ref 0.60–0.93)
Glucose, Bld: 98 mg/dL (ref 65–99)
Potassium: 3.8 mmol/L (ref 3.5–5.3)
Sodium: 140 mmol/L (ref 135–146)
Total Bilirubin: 0.9 mg/dL (ref 0.2–1.2)
Total Protein: 7 g/dL (ref 6.1–8.1)

## 2015-02-27 LAB — CBC WITH DIFFERENTIAL/PLATELET
Basophils Absolute: 0 10*3/uL (ref 0.0–0.1)
Basophils Relative: 0 % (ref 0–1)
Eosinophils Absolute: 0.1 10*3/uL (ref 0.0–0.7)
Eosinophils Relative: 1 % (ref 0–5)
HCT: 38.6 % (ref 36.0–46.0)
Hemoglobin: 13.1 g/dL (ref 12.0–15.0)
Lymphocytes Relative: 35 % (ref 12–46)
Lymphs Abs: 2 10*3/uL (ref 0.7–4.0)
MCH: 29.2 pg (ref 26.0–34.0)
MCHC: 33.9 g/dL (ref 30.0–36.0)
MCV: 86 fL (ref 78.0–100.0)
MPV: 9.4 fL (ref 8.6–12.4)
Monocytes Absolute: 0.3 10*3/uL (ref 0.1–1.0)
Monocytes Relative: 6 % (ref 3–12)
Neutro Abs: 3.4 10*3/uL (ref 1.7–7.7)
Neutrophils Relative %: 58 % (ref 43–77)
Platelets: 227 10*3/uL (ref 150–400)
RBC: 4.49 MIL/uL (ref 3.87–5.11)
RDW: 15 % (ref 11.5–15.5)
WBC: 5.8 10*3/uL (ref 4.0–10.5)

## 2015-02-27 MED ORDER — TAVABOROLE 5 % EX SOLN: 1.0000 [drp] | CUTANEOUS | Status: DC

## 2015-02-27 NOTE — Progress Notes (Signed)
Chief Complaint  Patient presents with  . cpe/med check    low pulse - 52  . eyes    had eyes checked 6 months ago here. Is going to get eyes check agian this week somewhere else.  . flu shot    given   Sylvia Gutierrez is a 69 y.o. female who presents for complete physical, annual wellness visit and follow-up on chronic medical conditions.  She has the following concerns:  Hypertension follow-up: She hasn't been checking BP's elsewhere--doesn't know how to use cuff she has at home, and the one at [facility] hasn't been working right. Denies dizziness, headaches, chest pain. Denies side effects of medications. She still has some ankle swelling, which gets worse throughout the day (R>L), unchanged from previous. H/o ankle injury (broken bone) in past.She continues to eat a lot of canned food that is given to her (free), tries to rinse them.  Hyperlipidemia follow-up: Patient is reportedly trying to following a low-fat, low cholesterol diet. Compliant with medications and denies medication side effects.   Obesity--sometimes she skips meals, eats too large of portions, some sweets. Drinks mostly sparkling water. She drinks orange juice (1 glass/d).  She didn't like lowfat milk, so went back to drinking whole milk daily.  Left knee OA--she had a cortisone shot about a year ago, overall doing much better, especially with regular exercise.  When it flares, she will go to the gym and exercise, and that really helps.   Onychomycosis--she used kerydin in the past (prescribed by podiatrist), had resolved, but recently noticed recurrence.    Immunization History  Administered Date(s) Administered  . Influenza, High Dose Seasonal PF 03/04/2013, 02/14/2014, 02/27/2015  . PPD Test 09/01/2013  . Pneumococcal Conjugate-13 02/14/2014  . Pneumococcal Polysaccharide-23 11/28/2010  . Td 05/13/2005  . Tdap 06/20/2011  . Zoster 01/10/2010   Last Pap smear: 07/04/08  Last mammogram: 01/2015  Last  colonoscopy: 06/14/09 (due again 06/2014); she denies getting notified that it was due. She believes she owes them money. Last DEXA: pt reports having had normal DEXA scan (not in our records, so prior to 2010)--she believes she has paperwork at home--she found it, but never gave [place] copy. She will bring it in. Dentist: about 5 months ago, goes once yearly. She has 5 teeth left.  Ophtho: She is going to the eye doctor later this week Exercise: gym 2-3x/week, silver sneaker class and cardio x 1 hour (elliptical, uses arms, and bike). Walk on the days she doesn't go to the gym (1 mile) Normal vitamin D level in 2014  Other doctors caring for patient include: GI: she can't recall the name of her doctor. Ophtho: Dr. Katy Fitch was the last one she saw Dentist: Dr. Nyoka Cowden  Depression, Fall and ADL screens were performed--see Epic.  Notable only for trouble making decisions (which has been most of her life, not new).  End of Life Discussion:  Patient does not have a living will and medical power of attorney. Paperwork was given.  Past Medical History  Diagnosis Date  . Hypertension   . Diverticulosis   . Hemorrhoids   . Anemia   . Hyperlipidemia   . Colon polyp     colonoscopy 06/2009, due again 2016  . Onychomycosis     Past Surgical History  Procedure Laterality Date  . Abdominal hysterectomy      for bleeding/benign  . Carpal tunnel release Right     Social History   Social History  . Marital Status:  Married    Spouse Name: N/A  . Number of Children: 4  . Years of Education: N/A   Occupational History  . MACHINE OPERATOR--Retired     Social History Main Topics  . Smoking status: Former Smoker    Quit date: 05/13/2005  . Smokeless tobacco: Never Used  . Alcohol Use: No  . Drug Use: No  . Sexual Activity: Not Currently   Other Topics Concern  . Not on file   Social History Narrative   Retired 2011.  Separated from her husband 2011, now divorced.  Lives alone.  Has 4  children, all in [place].  7 grandchildren, 2 great grandchildren    Family History  Problem Relation Age of Onset  . Hypertension Mother   . Cancer Father     colon (60's)  . Colon cancer Father   . Cancer Sister     throat  . Hypertension Brother   . Diabetes Brother   . Breast cancer Neg Hx   . Heart disease Neg Hx   . Hypertension Brother   . Hypertension Brother   . Cancer Brother     prostate    Outpatient Encounter Prescriptions as of 02/27/2015  Medication Sig  . aspirin 81 MG tablet Take 81 mg by mouth daily.    Marland Kitchen atenolol (TENORMIN) 25 MG tablet Take 1 tablet (25 mg total) by mouth daily.  . cholecalciferol (VITAMIN D) 1000 UNITS tablet Take 1,000 Units by mouth daily.  . fish oil-omega-3 fatty acids 1000 MG capsule Take 2 g by mouth daily.    Marland Kitchen lisinopril-hydrochlorothiazide (PRINZIDE,ZESTORETIC) 20-12.5 MG per tablet Take 1 tablet by mouth daily.  . pravastatin (PRAVACHOL) 40 MG tablet Take 1 tablet (40 mg total) by mouth daily.   No facility-administered encounter medications on file as of 02/27/2015.    No Known Allergies  Past Medical History  Diagnosis Date  . Hypertension   . Diverticulosis   . Hemorrhoids   . Anemia   . Hyperlipidemia   . Colon polyp     colonoscopy 06/2009, due again 2016  . Onychomycosis     Past Surgical History  Procedure Laterality Date  . Abdominal hysterectomy      for bleeding/benign  . Carpal tunnel release Right     Social History   Social History  . Marital Status: Married    Spouse Name: N/A  . Number of Children: 4  . Years of Education: N/A   Occupational History  . MACHINE OPERATOR--Retired     Social History Main Topics  . Smoking status: Former Smoker    Quit date: 05/13/2005  . Smokeless tobacco: Never Used  . Alcohol Use: No  . Drug Use: No  . Sexual Activity: Not Currently   Other Topics Concern  . Not on file   Social History Narrative   Retired 2011.  Separated from her husband  2011, now divorced.  Lives alone.  Has 4 children, all in [place].  7 grandchildren, 2 great grandchildren    Family History  Problem Relation Age of Onset  . Hypertension Mother   . Cancer Father     colon (72's)  . Colon cancer Father   . Cancer Sister     throat  . Hypertension Brother   . Diabetes Brother   . Breast cancer Neg Hx   . Heart disease Neg Hx   . Hypertension Brother   . Hypertension Brother   . Cancer Brother     prostate  Outpatient Encounter Prescriptions as of 02/27/2015  Medication Sig  . aspirin 81 MG tablet Take 81 mg by mouth daily.    Marland Kitchen atenolol (TENORMIN) 25 MG tablet Take 1 tablet (25 mg total) by mouth daily.  . cholecalciferol (VITAMIN D) 1000 UNITS tablet Take 1,000 Units by mouth daily.  . fish oil-omega-3 fatty acids 1000 MG capsule Take 2 g by mouth daily.    Marland Kitchen lisinopril-hydrochlorothiazide (PRINZIDE,ZESTORETIC) 20-12.5 MG per tablet Take 1 tablet by mouth daily.  . pravastatin (PRAVACHOL) 40 MG tablet Take 1 tablet (40 mg total) by mouth daily.   No facility-administered encounter medications on file as of 02/27/2015.    No Known Allergies  ROS: The patient denies fever, weight changes, vision changes, decreased hearing, ear pain, sore throat, breast concerns, chest pain, palpitations, dizziness, syncope, dyspnea on exertion, nausea, vomiting, diarrhea, constipation, abdominal pain, melena, hematochezia, indigestion/heartburn, hematuria, incontinence, dysuria, vaginal bleeding, discharge, odor or itch, genital lesions, weakness, tremor, suspicious skin lesions, depression, anxiety, abnormal bleeding/bruising, or enlarged lymph nodes.  Recurrent onychomycosis, as per HPI. She has some runny nose if leaning forwards, otherwise denies any significant sinus problems--no cough, headaches.  PHYSICAL EXAM:  BP 110/80 mmHg  Pulse 52  Resp 12  Ht 5\' 3"  (1.6 m)  Wt 191 lb 6.4 oz (86.818 kg)  BMI 33.91 kg/m2 134/74 on repeat by MD  General  Appearance:  Alert, cooperative, no distress, appears stated age   Head:  Normocephalic, without obvious abnormality, atraumatic.  Eyes:  PERRL, conjunctiva/corneas clear, EOM's intact, fundi  benign   Ears:  Normal TM's and external ear canals   Nose:  Nares normal, mucosa moderately edematous, no drainage or sinus tenderness   Throat:  Mucosa, and tongue normal; Dentures   Neck:  Supple, no lymphadenopathy; thyroid: no enlargement/tenderness/nodules; no carotid  bruit or JVD   Back:  Spine nontender, no curvature, ROM normal, no CVA tenderness   Lungs:  Clear to auscultation bilaterally without wheezes, rales or ronchi; respirations unlabored   Chest Wall:  No tenderness or deformity   Heart:  Regular rate and rhythm, S1 and S2 normal, no murmur, rub  or gallop   Breast Exam:  No tenderness, masses, or nipple discharge or inversion. No axillary lymphadenopathy   Abdomen:  Soft, non-tender, nondistended, normoactive bowel sounds,  no masses, no hepatosplenomegaly   Genitalia:  Normal external genitalia without lesions. BUS and vagina normal; uterus surgically absent. No adnexal masses or tenderness. Pap not performed   Rectal:  Normal tone, no masses or tenderness; guaiac negative stool   Extremities:  No clubbing, cyanosis or edema. Some thickening and discoloration only at the distal right great toenail and the right 5th toe.  No pitting edema at ankles.  Pulses:  2+ and symmetric all extremities   Skin:  Skin color, texture, turgor normal, no rashes or lesions   Lymph nodes:  Cervical, supraclavicular, and axillary nodes normal   Neurologic:  CNII-XII intact, normal strength, sensation and gait; reflexes 2+ and symmetric throughout   Psych: Normal mood, affect, hygiene and grooming.         ASSESSMENT/PLAN:  Annual physical exam - Plan: TSH, CBC with Differential/Platelet, Comprehensive metabolic panel, Lipid panel, Hepatitis C  antibody  Need for prophylactic vaccination and inoculation against influenza - Plan: Flu vaccine HIGH DOSE PF  Essential hypertension, benign - controlled; encouraged low sodium diet, weight loss - Plan: Comprehensive metabolic panel  Pure hypercholesterolemia - Plan: Comprehensive metabolic panel, Lipid panel  Obesity (BMI  30-39.9) - reviewed healthy diet, regular exercise, need for weight loss - Plan: TSH  Medication monitoring encounter - Plan: CBC with Differential/Platelet, Comprehensive metabolic panel, Lipid panel  Need for hepatitis C screening test - Plan: Hepatitis C antibody  Medicare annual wellness visit, initial  Onychomycosis - restart Kerydin - Plan: Tavaborole (KERYDIN) 5 % SOLN   Pt states she just got mail order supply of all of her meds (per computer, looks like it was filled for 6 months 6 mos ago, and should need refills, but pt states no).  Discussed monthly self breast exams and yearly mammograms; at least 30 minutes of aerobic activity at least 5 days/week and weight-bearing exercise 2x/week; proper sunscreen use reviewed; healthy diet, including goals of calcium and vitamin D intake and alcohol recommendations (less than or equal to 1 drink/day) reviewed; regular seatbelt use; changing batteries in smoke detectors.  Immunization recommendations discussed, flu shot given.  Colonoscopy recommendations reviewed--past due and asked to call GI to schedule.  Counseled re: diet--portions, sizes, healthy diet, not skipping meals, switching to lowfat milk, increasing exercise. Risks of obesity reviewed.   Medicare Attestation I have personally reviewed: The patient's medical and social history Their use of alcohol, tobacco or illicit drugs Their current medications and supplements The patient's functional ability including ADLs,fall risks, home safety risks, cognitive, and hearing and visual impairment Diet and physical activities Evidence for depression or mood  disorders  The patient's weight, height, and BMI have been recorded in the chart.  I have made referrals, counseling, and provided education to the patient based on review of the above and I have provided the patient with a written personalized care plan for preventive services.     Haily Caley A, MD   02/27/2015

## 2015-02-27 NOTE — Patient Instructions (Signed)
HEALTH MAINTENANCE RECOMMENDATIONS:  It is recommended that you get at least 30 minutes of aerobic exercise at least 5 days/week (for weight loss, you may need as much as 60-90 minutes). This can be any activity that gets your heart rate up. This can be divided in 10-15 minute intervals if needed, but try and build up your endurance at least once a week.  Weight bearing exercise is also recommended twice weekly.  Eat a healthy diet with lots of vegetables, fruits and fiber.  "Colorful" foods have a lot of vitamins (ie green vegetables, tomatoes, red peppers, etc).  Limit sweet tea, regular sodas and alcoholic beverages, all of which has a lot of calories and sugar.  Up to 1 alcoholic drink daily may be beneficial for women (unless trying to lose weight, watch sugars).  Drink a lot of water.  Calcium recommendations are 1200-1500 mg daily (1500 mg for postmenopausal women or women without ovaries), and vitamin D 1000 IU daily.  This should be obtained from diet and/or supplements (vitamins), and calcium should not be taken all at once, but in divided doses.  Monthly self breast exams and yearly mammograms for women over the age of 14 is recommended.  Sunscreen of at least SPF 30 should be used on all sun-exposed parts of the skin when outside between the hours of 10 am and 4 pm (not just when at beach or pool, but even with exercise, golf, tennis, and yard work!)  Use a sunscreen that says "broad spectrum" so it covers both UVA and UVB rays, and make sure to reapply every 1-2 hours.  Remember to change the batteries in your smoke detectors when changing your clock times in the spring and fall.  Use your seat belt every time you are in a car, and please drive safely and not be distracted with cell phones and texting while driving.  Please try and avoid skipping meals.  Eat more fruits and vegeables and fewer carbs (cut back on portions of breads, rice, pasta, potatoes).  Consider scheduling a nurse  visit to show you how to use your blood pressure monitor if you are having trouble (or just bring it to your next appointment).    Please get [place] a copy of your last bone density test, if you have it (or at least the date).  You are due to have your colonoscopy repeated--please call the gastroenterologist and discuss this.    [name] , Thank you for taking time to come for your Medicare Wellness Visit. I appreciate your ongoing commitment to your health goals. Please review the following plan we discussed and let me know if I can assist you in the future.   These are the goals we discussed: Goals    None      This is a list of the screening recommended for you and due dates:  Health Maintenance  Topic Date Due  .  Hepatitis C: One time screening is recommended by Center for Disease Control  (CDC) for  adults born from [birth year] through 1966.   1946-03-11  . DEXA scan (bone density measurement)  10/07/2009  . Flu Shot  12/12/2014  . Mammogram  01/12/2017  . Colon Cancer Screening  06/15/2019  . Tetanus Vaccine  06/19/2021  . Shingles Vaccine  Completed  . Pneumonia vaccines  Completed    You are due for colonoscopy now (due 06/2014, 5 years from the last, not 10 years as stated above). You got your flu shot today.  We are screening for hepatitis C with today's labs You had a bone density test--not reflected in above dates because we don't have the result. Immunizations are all up to date. The mammogram date listed above it incorrect--that is for every 2 years.  I recommend getting them yearly, due again in 01/2016

## 2015-02-28 ENCOUNTER — Encounter: Payer: Self-pay | Admitting: Family Medicine

## 2015-02-28 LAB — HEPATITIS C ANTIBODY: HCV Ab: NEGATIVE

## 2015-02-28 LAB — TSH: TSH: 1.607 u[IU]/mL (ref 0.350–4.500)

## 2015-03-06 ENCOUNTER — Telehealth: Payer: Self-pay

## 2015-03-06 NOTE — Telephone Encounter (Signed)
She called about her Kerydin 5% SOLN prescription. The system says it was done on 02/27/15, but the pt says when she called the pharmacy they didn't have it. Did you call this in?

## 2015-03-06 NOTE — Telephone Encounter (Signed)
Called pharmacy and they do have rx, just not ready because her insurance will NOT cover this medication and it is $581 for a month's supply. Pt states that she has had this rx before and it was $60 in the past. She will call over to [facility] and speak to them [id number] and call me back and let me know.

## 2015-03-23 NOTE — Telephone Encounter (Signed)
Called patient back and left message for her to return my call.

## 2015-03-23 NOTE — Telephone Encounter (Signed)
Have you heard anything back from them?

## 2015-03-24 ENCOUNTER — Other Ambulatory Visit: Payer: Self-pay | Admitting: Family Medicine

## 2015-04-10 ENCOUNTER — Other Ambulatory Visit: Payer: Medicare Other

## 2015-05-29 DIAGNOSIS — H2513 Age-related nuclear cataract, bilateral: Secondary | ICD-10-CM | POA: Diagnosis not present

## 2015-05-29 DIAGNOSIS — H40033 Anatomical narrow angle, bilateral: Secondary | ICD-10-CM | POA: Diagnosis not present

## 2015-08-28 ENCOUNTER — Encounter: Payer: Self-pay | Admitting: Family Medicine

## 2015-08-28 ENCOUNTER — Ambulatory Visit (INDEPENDENT_AMBULATORY_CARE_PROVIDER_SITE_OTHER): Payer: Medicare Other | Admitting: Family Medicine

## 2015-08-28 VITALS — BP 160/80 | HR 68 | Ht 63.0 in | Wt 195.6 lb

## 2015-08-28 DIAGNOSIS — E7800 Pure hypercholesterolemia, unspecified: Secondary | ICD-10-CM | POA: Diagnosis not present

## 2015-08-28 DIAGNOSIS — E669 Obesity, unspecified: Secondary | ICD-10-CM

## 2015-08-28 DIAGNOSIS — I10 Essential (primary) hypertension: Secondary | ICD-10-CM | POA: Diagnosis not present

## 2015-08-28 DIAGNOSIS — M25561 Pain in right knee: Secondary | ICD-10-CM

## 2015-08-28 DIAGNOSIS — Z5181 Encounter for therapeutic drug level monitoring: Secondary | ICD-10-CM

## 2015-08-28 LAB — COMPREHENSIVE METABOLIC PANEL
ALT: 13 U/L (ref 6–29)
AST: 20 U/L (ref 10–35)
Albumin: 4.1 g/dL (ref 3.6–5.1)
Alkaline Phosphatase: 64 U/L (ref 33–130)
BUN: 16 mg/dL (ref 7–25)
CO2: 27 mmol/L (ref 20–31)
Calcium: 9.1 mg/dL (ref 8.6–10.4)
Chloride: 104 mmol/L (ref 98–110)
Creat: 1 mg/dL — ABNORMAL HIGH (ref 0.60–0.93)
Glucose, Bld: 93 mg/dL (ref 65–99)
Potassium: 3.7 mmol/L (ref 3.5–5.3)
Sodium: 142 mmol/L (ref 135–146)
Total Bilirubin: 0.7 mg/dL (ref 0.2–1.2)
Total Protein: 6.5 g/dL (ref 6.1–8.1)

## 2015-08-28 MED ORDER — LISINOPRIL-HYDROCHLOROTHIAZIDE 20-12.5 MG PO TABS: ORAL_TABLET | ORAL | Status: DC

## 2015-08-28 MED ORDER — ATENOLOL 25 MG PO TABS
ORAL_TABLET | ORAL | Status: DC
Start: 2015-08-28 — End: 2015-09-26

## 2015-08-28 MED ORDER — PRAVASTATIN SODIUM 40 MG PO TABS: ORAL_TABLET | ORAL | Status: DC

## 2015-08-28 NOTE — Progress Notes (Signed)
Chief Complaint  Patient presents with  . Hypertension    fasting med check. Mentions that her right knee has been bothering her x 2 months.    Hypertension follow-up: She hasn't been checking BP's elsewhere (the monitor she had wasn't accurate, and no longer can check at [facility], machine broke). She hasn't taken her meds yet today, usually takes it early in the morning.  She took her last pill yesterday, needs refills. Denies dizziness, headaches, chest pain. Denies side effects of medications. She still has some ankle swelling, which gets worse throughout the day (R>L), unchanged from previous. H/o ankle injury (broken bone) in past.She continues to eat canned food about twice a week, tries to rinse.  Hyperlipidemia follow-up: Patient is reportedly trying to following a low-fat, low cholesterol diet. Compliant with medications and denies medication side effects.   Left knee OA--she had a cortisone shot in the past (over a year ago), overall doing much better. She is now complaining of some right knee pain. She will use ibuprofen once daily if needed.  Denies any GI side effects.  Denies any significant swelling or giving way of the knee.  Onychomycosis--she used kerydin in the past (prescribed by podiatrist), had resolved, but recurred last fall.  Med was refilled for her, she completed treatment, and reports that it again resolved.  Obesity--she finds she eats less if she drinks water before her meal.  + snacks some (1/2 cup ice cream, pecans, etc). She goes to [employer] and rides the bike 3x/week x 30 minutes.  Colonoscopy past due--she reports that she needs to save up for her portion of the payment before she can have it done.  PMH, PSH, SH and FH reviewed/updated  Outpatient Encounter Prescriptions as of 08/28/2015  Medication Sig Note  . aspirin 81 MG tablet Take 81 mg by mouth daily.     Marland Kitchen atenolol (TENORMIN) 25 MG tablet Take 1 tablet by mouth  daily   . cholecalciferol  (VITAMIN D) 1000 UNITS tablet Take 1,000 Units by mouth daily.   . fish oil-omega-3 fatty acids 1000 MG capsule Take 2 g by mouth daily.     Marland Kitchen ibuprofen (ADVIL,MOTRIN) 200 MG tablet Take 400 mg by mouth every 6 (six) hours as needed. 08/28/2015: Taking once a day recently for right knee pain  . lisinopril-hydrochlorothiazide (PRINZIDE,ZESTORETIC) 20-12.5 MG tablet Take 1 tablet by mouth  daily   . pravastatin (PRAVACHOL) 40 MG tablet Take 1 tablet by mouth  daily   . [DISCONTINUED] Tavaborole (KERYDIN) 5 % SOLN Apply 1 drop topically 1 day or 1 dose. Apply 1 drop to the toenail daily.    No facility-administered encounter medications on file as of 08/28/2015.   No Known Allergies  ROS: 4# weight gain since last visit. No fever, chills, headaches, dizziness, chest pain, URI symptoms, cough, shortness of breath, nausea, vomiting, bowel changes, urinary complaints, bleeding, bruising, rash, numbness or other concerns. +knee pain (right) as per HPI.  PHYSICAL EXAM: BP 170/84 mmHg  Pulse 68  Ht '5\' 3"'  (1.6 m)  Wt 195 lb 9.6 oz (88.724 kg)  BMI 34.66 kg/m2 160/80 on repeat by MD Well developed, pleasant female in no distress HEENT: PERRL, EOMI, conjunctiva clear Neck: no lymphadenopathy, thyromegaly or carotid bruit Heart: regular rate and rhythm Lungs: clear bilaterally Abdomen: soft, nontender, no organomegaly or mass Extremities: No edema Skin: no rash Neuro: alert and oriented. Normal gait, strength Psych: normal mood, affect, hygiene and grooming  ASSESSMENT/PLAN:  Essential hypertension, benign -  elevated today. reviewed low sodium diet, regular exercise, monitor elsewhere.  f/u 1 month; adjust meds if remains elevated - Plan: Comprehensive metabolic panel, lisinopril-hydrochlorothiazide (PRINZIDE,ZESTORETIC) 20-12.5 MG tablet, atenolol (TENORMIN) 25 MG tablet  Pure hypercholesterolemia - at goal on last check on current regimen. continue (check yearly) - Plan: Comprehensive  metabolic panel, pravastatin (PRAVACHOL) 40 MG tablet  Obesity (BMI 30-39.9) - diet/exercise reviewed--discussed portions, lower fat/calorie snacks  Right knee pain - check x-ray; check settings on bike; recheck at f/u in 1 month - Plan: DG Knee Complete 4 Views Right  Medication monitoring encounter - Plan: Comprehensive metabolic panel   c-met today. Lipids at goal on last check, check yearly.  Increase exercise to 150 minutes/week (30 minutes 5 days/week, or longer times each time you go if just 3x/week). Talk to the trainer at the gym to have them look at your bike set-up.  If your knee is bothering you, use very low tension. Be sure that your seat isn't too close.  Try and limit the sodium in your diet. Cut back on snacks and portions of nuts.  Eat more fruits and vegetables.  Please try and check your blood pressure periodically elsewhere.  Your blood pressure was high in the office today, and I doubt that missing just this morning's dose is enough to explain it.  Return in 1 month for a blood pressure follow up.  If able to check your blood pressure elsewhere during that time, please write the values down, and bring it with you to your visit. If your blood pressure remains high, we will need to adjust your medications.  We can re-assess your knee at that time as well.  Go to [facility] ([address] [place]) at your convenience (definitely before your next visit with me) for x-rays of the knee.  Try and use Tylenol Arthritis for knee pain. If this isn't effective, okay to use ibuprofen (be sure to take it with food, and stop if your stomach starts bothering you). Glucosamine and chondroitin (ie osteo-biflex supplement) is a supplement then when taking regularly can help with arthritis pain.  This can take up to a month to see a difference.

## 2015-08-28 NOTE — Patient Instructions (Addendum)
  Increase exercise to 150 minutes/week (30 minutes 5 days/week, or longer times each time you go if just 3x/week). Talk to the trainer at the gym to have them look at your bike set-up.  If your knee is bothering you, use very low tension. Be sure that your seat isn't too close.  Try and limit the sodium in your diet. Cut back on snacks and portions of nuts.  Eat more fruits and vegetables.  Please try and check your blood pressure periodically elsewhere.  Your blood pressure was high in the office today, and I doubt that missing just this morning's dose is enough to explain it.  Return in 1 month for a blood pressure follow up.  If able to check your blood pressure elsewhere during that time, please write the values down, and bring it with you to your visit. If your blood pressure remains high, we will need to adjust your medications.  We can re-assess your knee at that time as well.  Go to [facility] ([address] [place]) at your convenience (definitely before your next visit with me) for x-rays of the knee.   Try and use Tylenol Arthritis for knee pain. If this isn't effective, okay to use ibuprofen (be sure to take it with food, and stop if your stomach starts bothering you). Glucosamine and chondroitin (ie osteo-biflex supplement) is a supplement then when taking regularly can help with arthritis pain.  This can take up to a month to see a difference.

## 2015-08-29 ENCOUNTER — Encounter: Payer: Self-pay | Admitting: Family Medicine

## 2015-09-26 ENCOUNTER — Telehealth: Payer: Self-pay | Admitting: Family Medicine

## 2015-09-26 ENCOUNTER — Other Ambulatory Visit: Payer: Self-pay | Admitting: Family Medicine

## 2015-09-26 DIAGNOSIS — I10 Essential (primary) hypertension: Secondary | ICD-10-CM

## 2015-09-26 MED ORDER — LISINOPRIL-HYDROCHLOROTHIAZIDE 20-12.5 MG PO TABS: ORAL_TABLET | ORAL | Status: DC

## 2015-09-26 MED ORDER — ATENOLOL 25 MG PO TABS: ORAL_TABLET | ORAL | Status: DC

## 2015-09-26 NOTE — Telephone Encounter (Signed)
Pt called she is out of Atenolol and Lisinopril.  Refilled 1 time as pt has appt 5/22 with Dr. Tomi Bamberger and she is aware.  Pt phone disconnected.  I refilled both meds.

## 2015-10-02 ENCOUNTER — Ambulatory Visit (INDEPENDENT_AMBULATORY_CARE_PROVIDER_SITE_OTHER): Payer: Medicare Other | Admitting: Family Medicine

## 2015-10-02 ENCOUNTER — Encounter: Payer: Self-pay | Admitting: Family Medicine

## 2015-10-02 VITALS — BP 116/70 | HR 76 | Ht 63.0 in | Wt 193.6 lb

## 2015-10-02 DIAGNOSIS — I10 Essential (primary) hypertension: Secondary | ICD-10-CM

## 2015-10-02 MED ORDER — LISINOPRIL-HYDROCHLOROTHIAZIDE 20-12.5 MG PO TABS: ORAL_TABLET | ORAL | Status: DC

## 2015-10-02 NOTE — Patient Instructions (Addendum)
Your blood pressure was good. Continue your current medications, regular exercise, weight loss and low sodium diet. Continue to periodically monitor your blood pressure at [facility] it down and bring the values to your visits when you come.  Omron is a brand of blood pressure cuffs that are usually good. If you purchase a monitor, feel free to bring it with you to your visit so we can check if it is accurate.  You can also schedule a nurse visit to have it checked sooner, rather than waiting for your December appointment.     Tick Bite Information Ticks are insects that attach themselves to the skin and draw blood for food. There are various types of ticks. Common types include wood ticks and deer ticks. Most ticks live in shrubs and grassy areas. Ticks can climb onto your body when you make contact with leaves or grass where the tick is waiting. The most common places on the body for ticks to attach themselves are the scalp, neck, armpits, waist, and groin. Most tick bites are harmless, but sometimes ticks carry germs that cause diseases. These germs can be spread to a person during the tick's feeding process. The chance of a disease spreading through a tick bite depends on:   The type of tick.  Time of year.   How long the tick is attached.   Geographic location.  HOW CAN YOU PREVENT TICK BITES? Take these steps to help prevent tick bites when you are outdoors:  Wear protective clothing. Long sleeves and long pants are best.   Wear white clothes so you can see ticks more easily.  Tuck your pant legs into your socks.   If walking on a trail, stay in the middle of the trail to avoid brushing against bushes.  Avoid walking through areas with long grass.  Put insect repellent on all exposed skin and along boot tops, pant legs, and sleeve cuffs.   Check clothing, hair, and skin repeatedly and before going inside.   Brush off any ticks that are not attached.  Take a  shower or bath as soon as possible after being outdoors.  WHAT IS THE PROPER WAY TO REMOVE A TICK? Ticks should be removed as soon as possible to help prevent diseases caused by tick bites. 1. If latex gloves are available, put them on before trying to remove a tick.  2. Using fine-point tweezers, grasp the tick as close to the skin as possible. You may also use curved forceps or a tick removal tool. Grasp the tick as close to its head as possible. Avoid grasping the tick on its body. 3. Pull gently with steady upward pressure until the tick lets go. Do not twist the tick or jerk it suddenly. This may break off the tick's head or mouth parts. 4. Do not squeeze or crush the tick's body. This could force disease-carrying fluids from the tick into your body.  5. After the tick is removed, wash the bite area and your hands with soap and water or other disinfectant such as alcohol. 6. Apply a small amount of antiseptic cream or ointment to the bite site.  7. Wash and disinfect any instruments that were used.  Do not try to remove a tick by applying a hot match, petroleum jelly, or fingernail polish to the tick. These methods do not work and may increase the chances of disease being spread from the tick bite.  WHEN SHOULD YOU SEEK MEDICAL CARE? Contact your health care provider  if you are unable to remove a tick from your skin or if a part of the tick breaks off and is stuck in the skin.  After a tick bite, you need to be aware of signs and symptoms that could be related to diseases spread by ticks. Contact your health care provider if you develop any of the following in the days or weeks after the tick bite:  Unexplained fever.  Rash. A circular rash that appears days or weeks after the tick bite may indicate the possibility of Lyme disease. The rash may resemble a target with a bull's-eye and may occur at a different part of your body than the tick bite.  Redness and swelling in the area of the  tick bite.   Tender, swollen lymph glands.   Diarrhea.   Weight loss.   Cough.   Fatigue.   Muscle, joint, or bone pain.   Abdominal pain.   Headache.   Lethargy or a change in your level of consciousness.  Difficulty walking or moving your legs.   Numbness in the legs.   Paralysis.  Shortness of breath.   Confusion.   Repeated vomiting.    This information is not intended to replace advice given to you by your health care provider. Make sure you discuss any questions you have with your health care provider.   Document Released: 04/26/2000 Document Revised: 05/20/2014 Document Reviewed: 10/07/2012 Elsevier Interactive Patient Education [employer].

## 2015-10-02 NOTE — Progress Notes (Signed)
Chief Complaint  Patient presents with  . Follow-up    1 month follow up on bp and knee, knee is completely better.    Patient presents to follow up on her hypertension.  BP was elevated at her last visit.  No medication changes were made--reviewed low cholesterol diet, and asked to monitor BP elsewhere.  She checked her BP twice since her last visit:  133/81 on 5/16, 117/65 on 5/20.  They got a new monitor at [facility], and she has been checking it there.  Denies dizziness, headaches, chest pain. Denies side effects of medications. She still has some ankle swelling, which gets worse throughout the day (R>L), unchanged from previous. H/o ankle injury (broken bone) in past.She tries to follow a low sodium diet.  Some stress/"nerves" related to caring for her dog and ticks (dog lives at her ex's). She worries about protecting herself from ticks. She found one tick--removed it quickly, no rash or problems.  Right knee pain- Pain resolved, so never went for x-ray (ordered).  She continued to use the elliptical (she powered through the pain, pain resolved as she continued to exercise).  No problems with any pain for a few weeks now.  PMH, PSH, SH reviewed  Outpatient Encounter Prescriptions as of 10/02/2015  Medication Sig Note  . aspirin 81 MG tablet Take 81 mg by mouth daily.     Marland Kitchen atenolol (TENORMIN) 25 MG tablet Take 1 tablet by mouth  daily   . cholecalciferol (VITAMIN D) 1000 UNITS tablet Take 1,000 Units by mouth daily.   . fish oil-omega-3 fatty acids 1000 MG capsule Take 1 g by mouth daily.    Marland Kitchen lisinopril-hydrochlorothiazide (PRINZIDE,ZESTORETIC) 20-12.5 MG tablet Take 1 tablet by mouth  daily   . pravastatin (PRAVACHOL) 40 MG tablet Take 1 tablet by mouth  daily   . [DISCONTINUED] lisinopril-hydrochlorothiazide (PRINZIDE,ZESTORETIC) 20-12.5 MG tablet Take 1 tablet by mouth  daily   . [DISCONTINUED] ibuprofen (ADVIL,MOTRIN) 200 MG tablet Take 400 mg by mouth every 6 (six) hours as  needed. 08/28/2015: Taking once a day recently for right knee pain   No facility-administered encounter medications on file as of 10/02/2015.   No Known Allergies  ROS: no fever, chills, URI symptoms, GI complaints, bleeding/bruising, rash, headaches, dizziness, chest pain or other concerns except as noted in HPI.  PHYSICAL EXAM: BP 140/82 mmHg  Pulse 76  Ht 5\' 3"  (1.6 m)  Wt 193 lb 9.6 oz (87.816 kg)  BMI 34.30 kg/m2   116/70 on repeat by MD  Well developed, pleasant female in no distress Heart: regular rate and rhythm without murmur Lungs: clear bilaterally Extremities: no edema Neuro: alert and oriented, normal gait Psych: normal mood, affect, hygiene and grooming Skin: no visible rashes/lesions.  ASSESSMENT/PLAN:  Essential hypertension, benign - well controlled - Plan: lisinopril-hydrochlorothiazide (PRINZIDE,ZESTORETIC) 20-12.5 MG tablet  Tick exposure--counseled re: preventative measures she can take, and what to look for if tick is found.  F/u as scheduled in December

## 2015-10-16 ENCOUNTER — Ambulatory Visit (INDEPENDENT_AMBULATORY_CARE_PROVIDER_SITE_OTHER): Payer: Medicare Other | Admitting: Family Medicine

## 2015-10-16 ENCOUNTER — Encounter: Payer: Self-pay | Admitting: Family Medicine

## 2015-10-16 VITALS — BP 114/70 | HR 60 | Wt 194.4 lb

## 2015-10-16 DIAGNOSIS — N644 Mastodynia: Secondary | ICD-10-CM

## 2015-10-16 NOTE — Progress Notes (Signed)
   Subjective:    Patient ID: Sylvia Gutierrez, female    DOB: 1946/02/20, 70 y.o.   MRN: [id number]  HPI Yesterday she noted the onset of right-sided breast pain lasted roughly 10 minutes. There was no associated shortness of breath, diaphoresis, cough, congestion, breast discharge. She has had an mammogram within the last year. She states that it is slightly uncomfortable at the present time.   Review of Systems     Objective:   Physical Exam Alert and in no distress. Lungs are clear to auscultation. Cardiac exam shows regular rhythm without murmurs or gallops. Right breast exam shows no palpable lesions. No discharge noted.       Assessment & Plan:  Breast pain, right I reassured her that I did not think she was in any danger especially since the symptom tends to come and go. Recommend supportive care and continued difficulty she will return here for follow-up.

## 2015-10-16 NOTE — Patient Instructions (Signed)
Take 2 Tylenol 4 times a day for the next several days and see if that'll take care of

## 2015-10-25 ENCOUNTER — Telehealth: Payer: Self-pay

## 2015-10-25 DIAGNOSIS — I10 Essential (primary) hypertension: Secondary | ICD-10-CM

## 2015-10-25 MED ORDER — ATENOLOL 25 MG PO TABS: ORAL_TABLET | ORAL | Status: DC

## 2015-10-25 NOTE — Telephone Encounter (Signed)
Done

## 2015-10-25 NOTE — Telephone Encounter (Signed)
Fax request rcvd from [facility] for Atenolol 25mg  #[phone]. Victorino December

## 2015-11-14 IMAGING — CR DG KNEE COMPLETE 4+V*L*
4 series · 4 of 4 positions shown · non-contrast
Comparison: 05/30/2004

CLINICAL DATA: Left knee pain for several weeks, no known trauma

EXAM:
LEFT KNEE - COMPLETE 4+ VIEW

[view not recorded (1 of 4)]
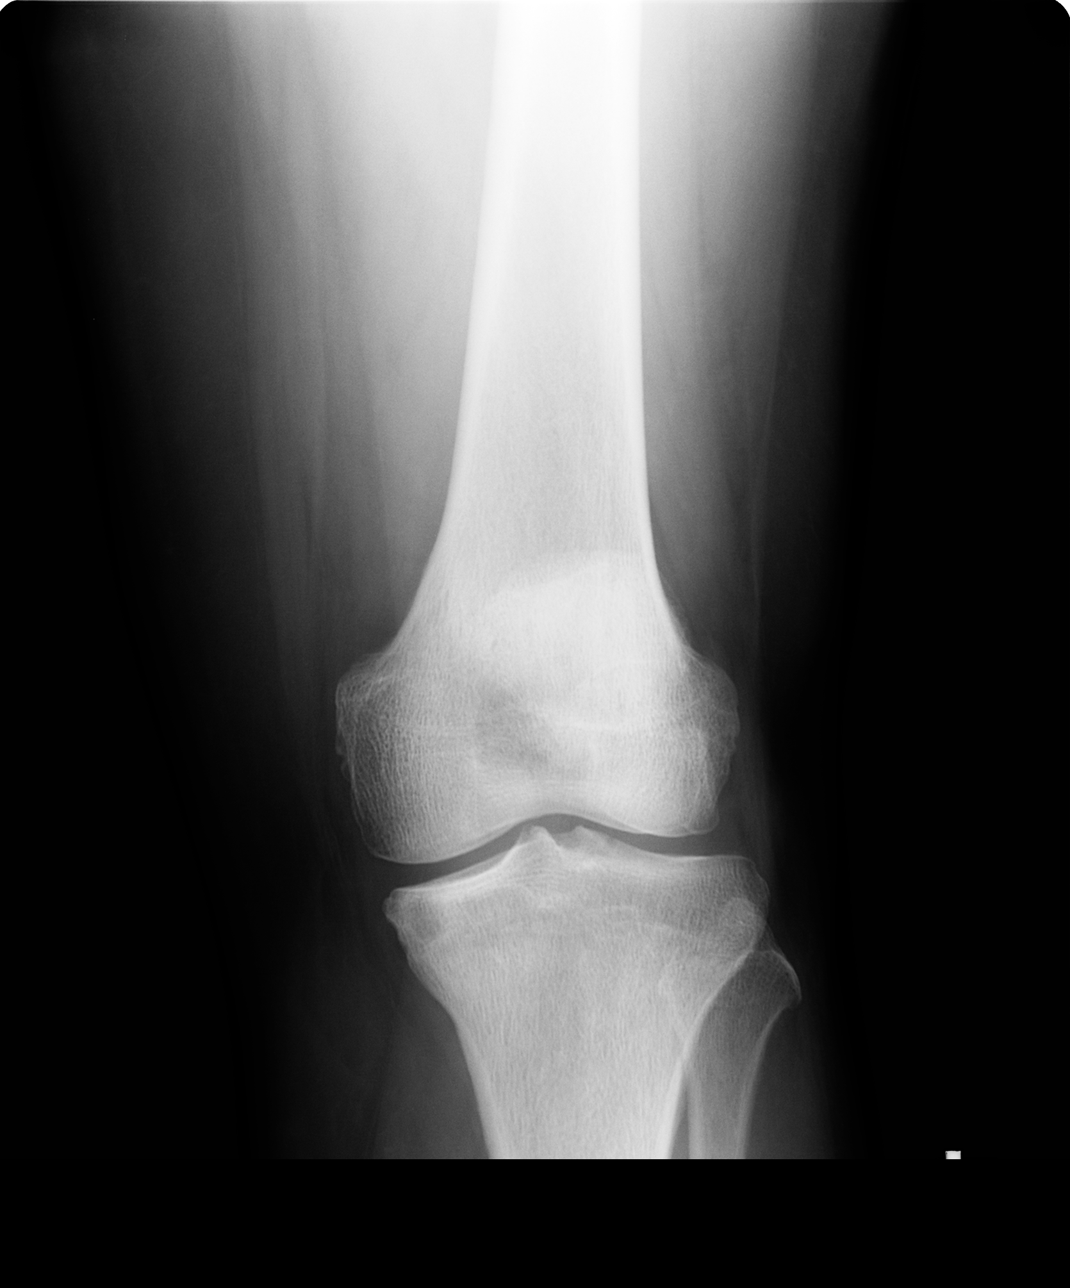

[view not recorded (2 of 4)]
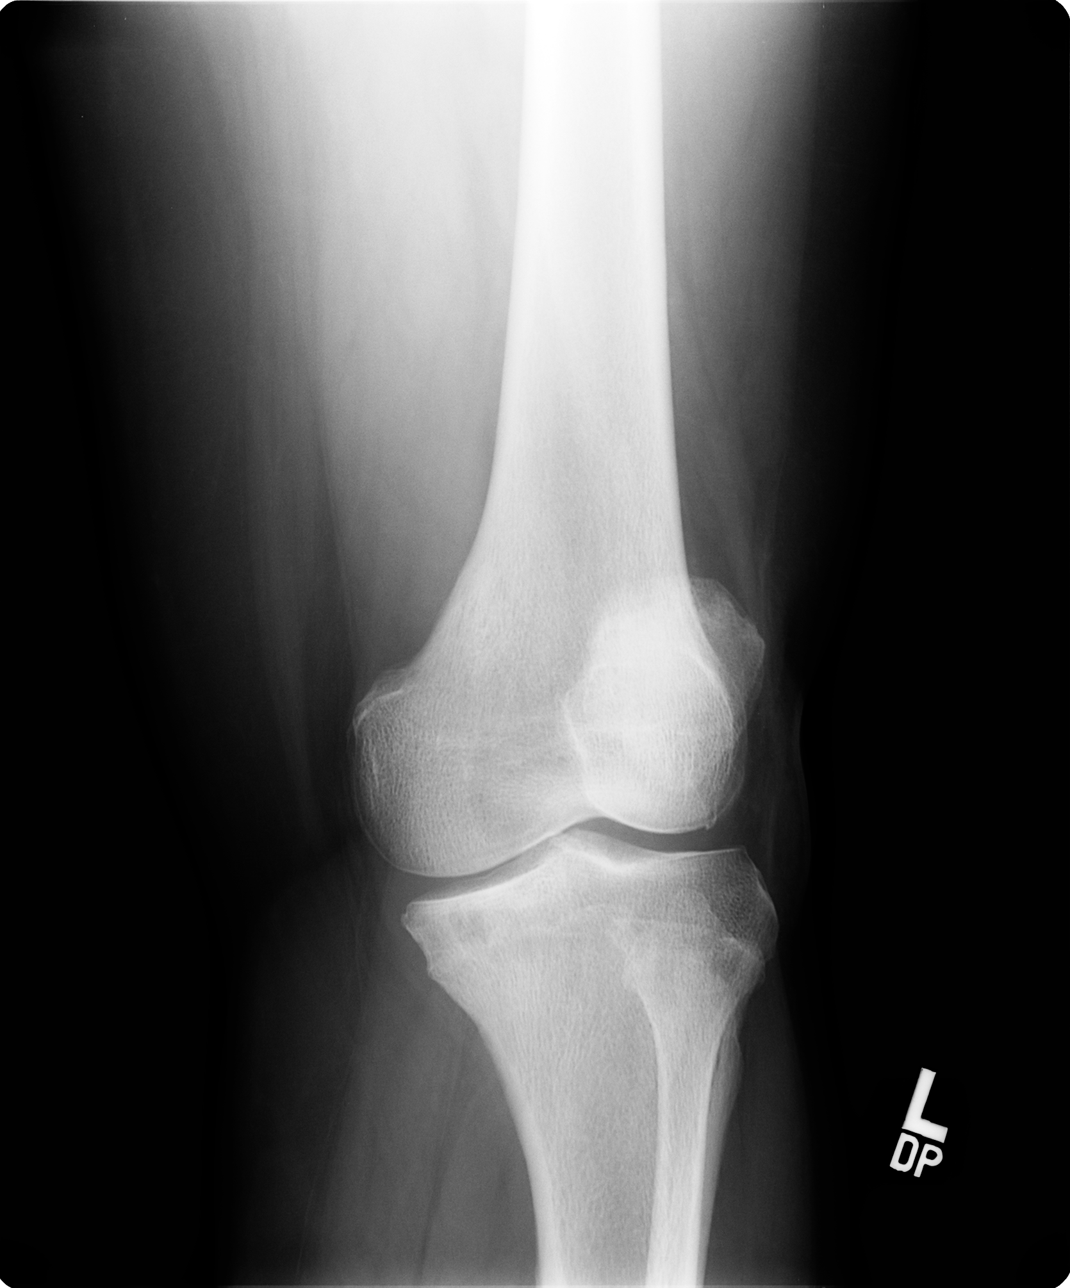

[view not recorded (3 of 4)]
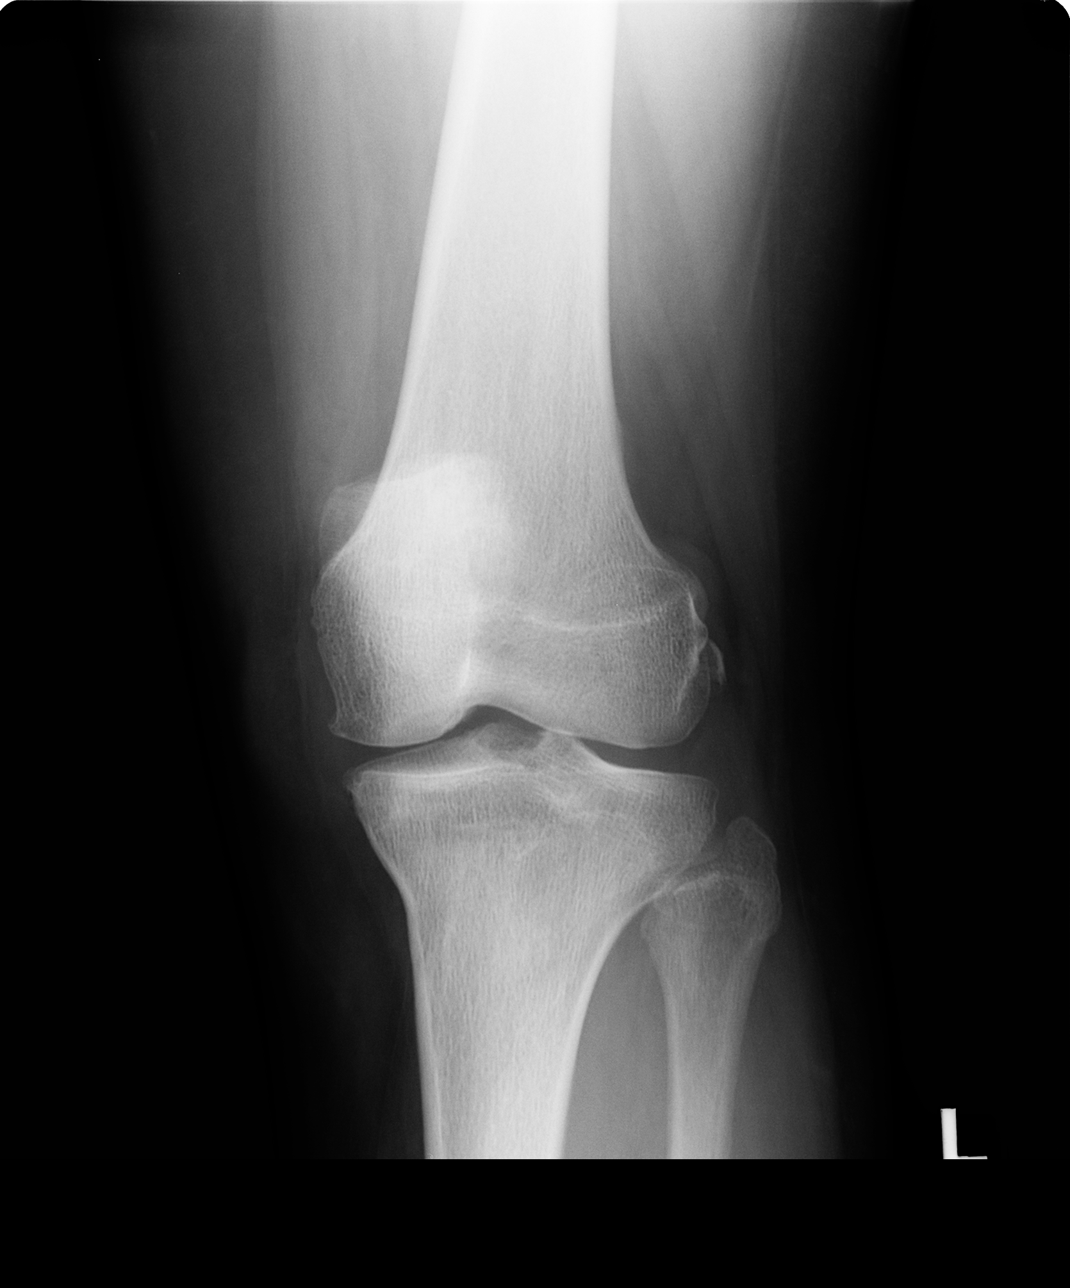

[view not recorded (4 of 4)]
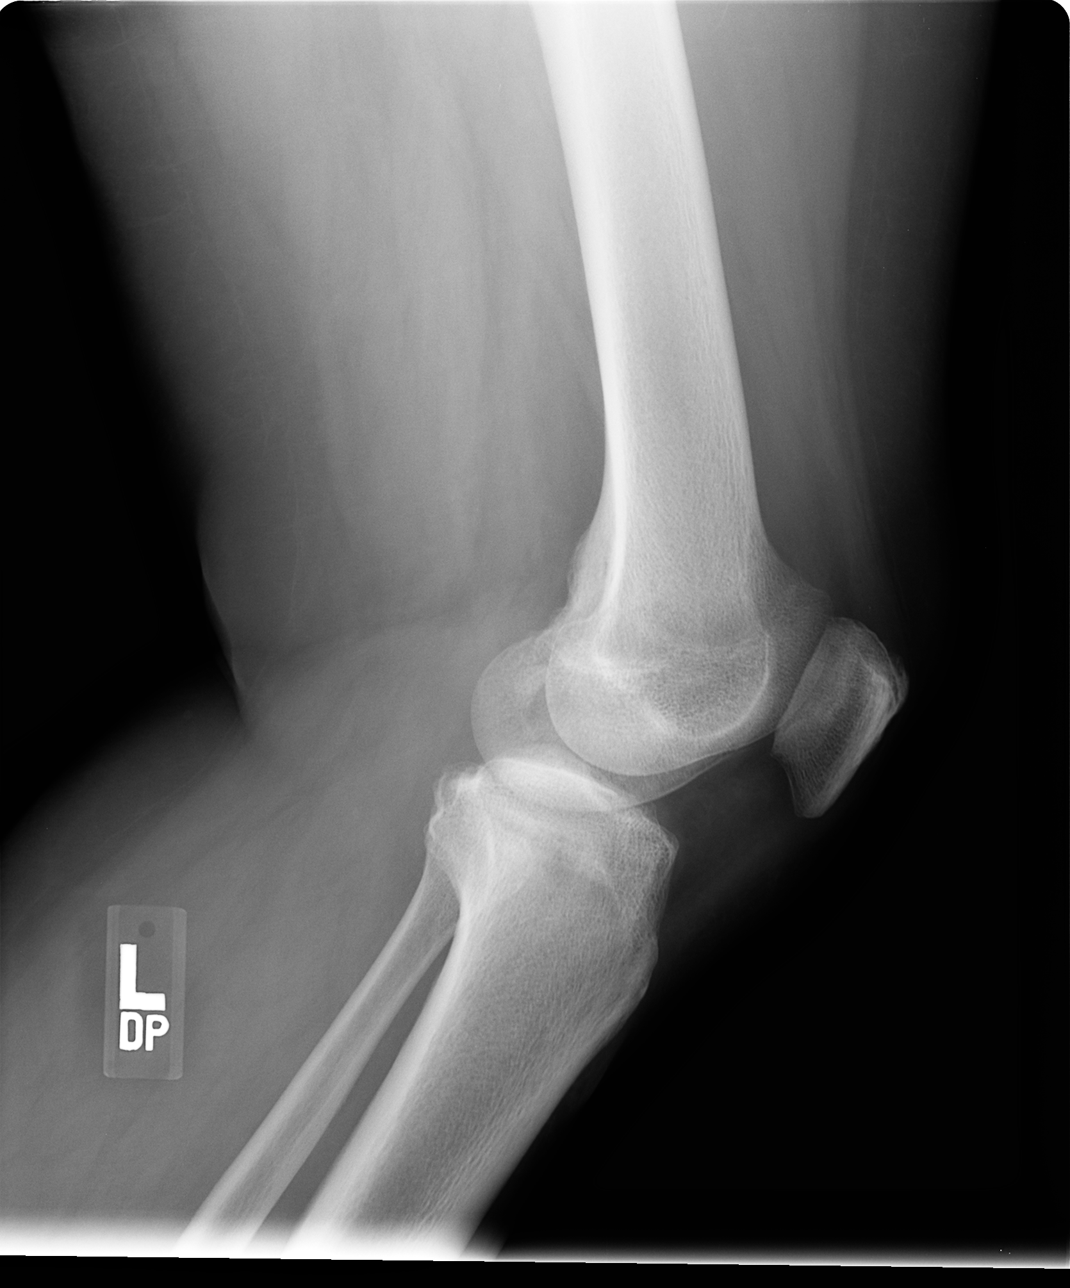

[4 of 4 positions shown; findings below may reference images not displayed]

FINDINGS: Four views of the left these tube VT. No acute fracture or
subluxation. Mild narrowing of medial joint compartment. Minimal
spurring of medial femoral condyle and medial tibial plateau. Mild
narrowing of patellofemoral joint space.
IMPRESSION: No acute fracture or subluxation.  Mild osteoarthritic changes.

## 2015-11-23 ENCOUNTER — Telehealth: Payer: Self-pay

## 2015-11-23 DIAGNOSIS — E7800 Pure hypercholesterolemia, unspecified: Secondary | ICD-10-CM

## 2015-11-23 MED ORDER — PRAVASTATIN SODIUM 40 MG PO TABS: ORAL_TABLET | ORAL | Status: DC

## 2015-11-23 NOTE — Telephone Encounter (Signed)
Fax rcvd pt requesting Pravastatin 40mg  sent to [facility] 90 day please. Victorino December

## 2015-11-23 NOTE — Telephone Encounter (Signed)
Done

## 2015-12-26 ENCOUNTER — Telehealth: Payer: Self-pay

## 2015-12-26 NOTE — Telephone Encounter (Signed)
Needs refill of atenolol sent to [facility]. Victorino December

## 2016-01-17 DIAGNOSIS — Z1231 Encounter for screening mammogram for malignant neoplasm of breast: Secondary | ICD-10-CM | POA: Diagnosis not present

## 2016-01-17 LAB — HM MAMMOGRAPHY

## 2016-01-22 ENCOUNTER — Encounter: Payer: Self-pay | Admitting: *Deleted

## 2016-01-26 ENCOUNTER — Telehealth: Payer: Self-pay

## 2016-01-26 DIAGNOSIS — I10 Essential (primary) hypertension: Secondary | ICD-10-CM

## 2016-01-26 MED ORDER — ATENOLOL 25 MG PO TABS
ORAL_TABLET | ORAL | 0 refills | Status: DC
Start: 2016-01-26 — End: 2016-05-02

## 2016-01-26 NOTE — Telephone Encounter (Signed)
Refill atenolol to [facility]

## 2016-04-10 ENCOUNTER — Telehealth: Payer: Self-pay | Admitting: Family Medicine

## 2016-04-10 ENCOUNTER — Other Ambulatory Visit: Payer: Self-pay | Admitting: *Deleted

## 2016-04-10 DIAGNOSIS — I10 Essential (primary) hypertension: Secondary | ICD-10-CM

## 2016-04-10 MED ORDER — LISINOPRIL-HYDROCHLOROTHIAZIDE 20-12.5 MG PO TABS: ORAL_TABLET | ORAL | 0 refills | Status: DC

## 2016-04-10 NOTE — Telephone Encounter (Signed)
Done

## 2016-04-10 NOTE — Telephone Encounter (Signed)
[name] request Lisinopril-HCTZ 20-12.5 mg

## 2016-04-24 NOTE — Progress Notes (Signed)
Chief Complaint  Patient presents with  . Medicare Wellness    fasting CPE/AWV with pelvic. No concerns, Just had eye exam at [facility].    Sylvia Gutierrez is a 70 y.o. female who presents for annual physical, Medicare wellness visit and follow-up on chronic medical conditions.  She has the following concerns:  She has been having some right knee pain for the last 8-9 months, intermittently.  Denies any giving way or locking up. She uses ibuprofen once daily for a couple of days when it flares up, and that usually resolves the pain for a while.  She stopped going to the gym because that seemed to flare it up.  She has been walking, which doesn't bother her knee much at all.  She had been using the elliptical and bicycle at the gym.  Previously had left knee pain, s/p cortisone shot in the past with good results.  Left knee no longer bothers her.  Hypertension follow-up:  BP's at home range in the mid-130's/70's. Denies dizziness, headaches, chest pain. Denies side effects of medications. She still has some ankle swelling, which gets worse throughout the day (R>L), unchanged from previous. H/o ankle injury (broken bone) in past.She tries to follow low sodium diet.  Hyperlipidemia follow-up: Patient is reportedly trying to following a low-fat, low cholesterol diet. Compliant with medications and denies medication side effects.   Onychomycosis--she used kerydin in the past (prescribed by podiatrist), had resolved, but recurred and second course of treatment was then given, also with good results. She denies recurrence of fungal infection.   Immunization History  Administered Date(s) Administered  . Influenza, High Dose Seasonal PF 03/04/2013, 02/14/2014, 02/27/2015  . PPD Test 09/01/2013  . Pneumococcal Conjugate-13 02/14/2014  . Pneumococcal Polysaccharide-23 11/28/2010  . Td 05/13/2005  . Tdap 06/20/2011  . Zoster 01/10/2010   Last Pap smear: 07/04/08, s/p hysterectomy Last mammogram:  01/2016  Last colonoscopy: 06/14/09 (due again 06/2014); had polyp.  No pathology report in her chart Last DEXA: pt reports having had normal DEXA scan (not in our records, so prior to 2010)--she previously had found it, thought she gave it to [place], no longer has it.  We have no record in chart. It was "a long time ago" and willing to have another. Dentist: goes once yearly. She has 5 teeth left.  Ophtho: every other year, went over this past summer. Exercise: Walks 3x/week for 30 minutes. This is much less than last year (had been going to gym 2-3x/week, silver sneaker class and cardio x 1 hour (elliptical, uses arms, and bike and walking on the days she doesn't go to the gym (1 mile)) Normal vitamin D level in 2014  Other doctors caring for patient include: GI: Dr. Benson Norway Ophtho: Dr. Katy Fitch in the past, last went to [place] Dentist: Dr. Nyoka Cowden Podiatrist: [place]  Depression, Fall and Functional Status screens were performed and unremarkable--see Epic for full screens.    End of Life Discussion:  Patient does not have a living will and medical power of attorney. Paperwork was given and discussed reasons for doing.  Past Medical History:  Diagnosis Date  . Anemia   . Colon polyp    colonoscopy 06/2009, due again 2016  . Diverticulosis   . Hemorrhoids   . Hyperlipidemia   . Hypertension   . Onychomycosis     Past Surgical History:  Procedure Laterality Date  . ABDOMINAL HYSTERECTOMY     for bleeding/benign  . CARPAL TUNNEL RELEASE Right  Social History   Social History  . Marital status: Married    Spouse name: N/A  . Number of children: 4  . Years of education: N/A   Occupational History  . MACHINE OPERATOR--Retired  [employer]   Social History Main Topics  . Smoking status: Former Smoker    Quit date: 05/13/2005  . Smokeless tobacco: Never Used  . Alcohol use No  . Drug use: No  . Sexual activity: Not Currently   Other Topics Concern  . Not on file    Social History Narrative   Retired 2011.  Separated from her husband 2011, now divorced.  Lives alone.  Has 4 children, all in [place].  7 grandchildren, 2 great grandchildren    Family History  Problem Relation Age of Onset  . Hypertension Mother   . Cancer Father     colon (75's)  . Colon cancer Father   . Cancer Sister     throat  . Hypertension Brother   . Diabetes Brother   . Hypertension Brother   . Hypertension Brother   . Cancer Brother     prostate  . Breast cancer Neg Hx   . Heart disease Neg Hx     Outpatient Encounter Prescriptions as of 04/25/2016  Medication Sig  . aspirin 81 MG tablet Take 81 mg by mouth daily.    Marland Kitchen atenolol (TENORMIN) 25 MG tablet Take 1 tablet by mouth  daily  . cholecalciferol (VITAMIN D) 1000 UNITS tablet Take 1,000 Units by mouth daily.  . fish oil-omega-3 fatty acids 1000 MG capsule Take 1 g by mouth daily.   Marland Kitchen lisinopril-hydrochlorothiazide (PRINZIDE,ZESTORETIC) 20-12.5 MG tablet Take 1 tablet by mouth  daily  . pravastatin (PRAVACHOL) 40 MG tablet Take 1 tablet by mouth  daily   No facility-administered encounter medications on file as of 04/25/2016.     No Known Allergies   ROS: The patient denies fever, vision changes, decreased hearing, ear pain, sore throat, breast concerns, chest pain, palpitations, dizziness, syncope, dyspnea on exertion, nausea, vomiting, diarrhea, constipation, abdominal pain, melena, hematochezia, indigestion/heartburn, hematuria, incontinence, dysuria, vaginal bleeding, discharge, odor or itch, genital lesions, weakness, tremor, suspicious skin lesions, depression, anxiety, abnormal bleeding/bruising, or enlarged lymph nodes.  She has some runny nose if leaning forwards, otherwise denies any significant sinus problems--no cough, headaches. +intentional weight loss (6# in the last 6 months) Intermittent R knee pain as per HPI.  PHYSICAL EXAM:  BP 136/86 (BP Location: Left Arm, Patient Position:  Sitting, Cuff Size: Normal)   Pulse 64   Ht '5\' 1"'  (1.549 m)   Wt 189 lb 6.4 oz (85.9 kg)   BMI 35.79 kg/m   General Appearance:  Alert, cooperative, no distress, appears stated age   Head:  Normocephalic, without obvious abnormality, atraumatic.  Eyes:  PERRL, conjunctiva/corneas clear, EOM's intact, fundi not well visualized   Ears:  Normal TM's and external ear canals   Nose:  Nares normal, mucosa mildly edematous, no drainage or sinus tenderness   Throat:  Mucosa, and tongue normal; Dentures   Neck:  Supple, no lymphadenopathy; thyroid: no enlargement/tenderness/nodules; no carotid bruit or JVD   Back:  Spine nontender, no curvature, ROM normal, no CVA tenderness   Lungs:  Clear to auscultation bilaterally without wheezes, rales or ronchi; respirations unlabored   Chest Wall:  No tenderness or deformity   Heart:  Regular rate and rhythm, S1 and S2 normal, no murmur, rub or gallop   Breast Exam:  No tenderness, masses,  or nipple discharge or inversion. No axillary lymphadenopathy   Abdomen:  Soft, non-tender, nondistended, normoactive bowel sounds, no masses, no hepatosplenomegaly   Genitalia:  Normal external genitalia without lesions. BUS and vagina normal; uterus surgically absent. No adnexal masses or tenderness. Pap not performed   Rectal:  Normal tone, no masses or tenderness; guaiac negative stool   Extremities:  No clubbing, cyanosis or edema..  Pulses:  2+ and symmetric all extremities   Skin:  Skin color, texture, turgor normal, no rashes or lesions   Lymph nodes:  Cervical, supraclavicular, and axillary nodes normal   Neurologic:  CNII-XII intact, normal strength, sensation and gait; reflexes 2+ and symmetric throughout                        Psych:  Normal mood, affect, hygiene and grooming.     ASSESSMENT/PLAN:  Annual physical exam  Medicare annual wellness visit, subsequent  Essential hypertension, benign - borderline  today, somewhat better at home. Reviewed need for daily exercise, continued weight loss, and low sodium diet - Plan: Comprehensive metabolic panel  Pure hypercholesterolemia - Plan: Lipid panel, Comprehensive metabolic panel  Obesity (BMI 30-39.9) - counseled re: exercise, diet weight loss--further weight loss encouraged  Other fatigue - Plan: Comprehensive metabolic panel, CBC with Differential/Platelet, TSH  Medication monitoring encounter - Plan: Comprehensive metabolic panel, CBC with Differential/Platelet  Need for prophylactic vaccination and inoculation against influenza - Plan: Flu vaccine HIGH DOSE PF (Fluzone High dose)  Colon cancer screening - past due; +FH colon cancer (dad); polyp on last scope 2011--no pathology available. Trouble affording $200 needed. Get path results. Consider cologard   High dose flu shot today shingrix when available--discussed in detail.  Colonoscopy due--can't afford her part. Check pathology from 2011 (Dr. Benson Norway). If hyperplastic, then do Cologard. If adenomatous, should have colonoscopy, especially due to first degree family member with colon cancer (but if unaffordable/unwilling, will start with Cologard first, with the understanding that she will need to have colonoscopy if the cologard test is abnormal.)  Needs refill of atenolol--will call with the name of the mail order pharmacy, and then can refill x 6 mos  (states NOT [facility] where rx's have been sent in past).  c-met, lipid, CBC, TSH today  MOST form completed. Full Code, Full Care Given paperwork for Living Will and Healthcare POA, discussed reasons for completing and discussing with family, and getting [place] copies.   Discussed monthly self breast exams and yearly mammograms; at least 30 minutes of aerobic activity at least 5 days/week and weight-bearing exercise 2x/week; proper sunscreen use reviewed; healthy diet, including goals of calcium and vitamin D intake and alcohol recommendations  (less than or equal to 1 drink/day) reviewed; regular seatbelt use; changing batteries in smoke detectors.  Immunization recommendations discussed, flu shot given.  Shingrix recommended when available.  Colonoscopy recommendations reviewed--past due --see above  Medicare Attestation I have personally reviewed: The patient's medical and social history Their use of alcohol, tobacco or illicit drugs Their current medications and supplements The patient's functional ability including ADLs,fall risks, home safety risks, cognitive, and hearing and visual impairment Diet and physical activities Evidence for depression or mood disorders  The patient's weight, height, and BMI have been recorded in the chart.  I have made referrals, counseling, and provided education to the patient based on review of the above and I have provided the patient with a written personalized care plan for preventive services.     Kearah Gayden  A, MD   04/24/2016

## 2016-04-25 ENCOUNTER — Ambulatory Visit (INDEPENDENT_AMBULATORY_CARE_PROVIDER_SITE_OTHER): Payer: Medicare Other | Admitting: Family Medicine

## 2016-04-25 ENCOUNTER — Encounter: Payer: Self-pay | Admitting: Family Medicine

## 2016-04-25 VITALS — BP 136/86 | HR 64 | Ht 61.0 in | Wt 189.4 lb

## 2016-04-25 DIAGNOSIS — I10 Essential (primary) hypertension: Secondary | ICD-10-CM

## 2016-04-25 DIAGNOSIS — Z0000 Encounter for general adult medical examination without abnormal findings: Secondary | ICD-10-CM

## 2016-04-25 DIAGNOSIS — Z5181 Encounter for therapeutic drug level monitoring: Secondary | ICD-10-CM | POA: Diagnosis not present

## 2016-04-25 DIAGNOSIS — Z1211 Encounter for screening for malignant neoplasm of colon: Secondary | ICD-10-CM

## 2016-04-25 DIAGNOSIS — E7800 Pure hypercholesterolemia, unspecified: Secondary | ICD-10-CM | POA: Diagnosis not present

## 2016-04-25 DIAGNOSIS — R5383 Other fatigue: Secondary | ICD-10-CM | POA: Diagnosis not present

## 2016-04-25 DIAGNOSIS — Z23 Encounter for immunization: Secondary | ICD-10-CM | POA: Diagnosis not present

## 2016-04-25 DIAGNOSIS — E669 Obesity, unspecified: Secondary | ICD-10-CM | POA: Diagnosis not present

## 2016-04-25 LAB — COMPREHENSIVE METABOLIC PANEL
ALT: 12 U/L (ref 6–29)
AST: 16 U/L (ref 10–35)
Albumin: 4.2 g/dL (ref 3.6–5.1)
Alkaline Phosphatase: 61 U/L (ref 33–130)
BUN: 22 mg/dL (ref 7–25)
CO2: 32 mmol/L — ABNORMAL HIGH (ref 20–31)
Calcium: 9.3 mg/dL (ref 8.6–10.4)
Chloride: 104 mmol/L (ref 98–110)
Creat: 1.1 mg/dL — ABNORMAL HIGH (ref 0.60–0.93)
Glucose, Bld: 93 mg/dL (ref 65–99)
Potassium: 3.6 mmol/L (ref 3.5–5.3)
Sodium: 142 mmol/L (ref 135–146)
Total Bilirubin: 0.6 mg/dL (ref 0.2–1.2)
Total Protein: 6.9 g/dL (ref 6.1–8.1)

## 2016-04-25 LAB — CBC WITH DIFFERENTIAL/PLATELET
Basophils Absolute: 0 cells/uL (ref 0–200)
Basophils Relative: 0 %
Eosinophils Absolute: 64 cells/uL (ref 15–500)
Eosinophils Relative: 1 %
HCT: 40.3 % (ref 35.0–45.0)
Hemoglobin: 13.3 g/dL (ref 11.7–15.5)
Lymphocytes Relative: 34 %
Lymphs Abs: 2176 cells/uL (ref 850–3900)
MCH: 29 pg (ref 27.0–33.0)
MCHC: 33 g/dL (ref 32.0–36.0)
MCV: 88 fL (ref 80.0–100.0)
MPV: 9.9 fL (ref 7.5–12.5)
Monocytes Absolute: 320 cells/uL (ref 200–950)
Monocytes Relative: 5 %
Neutro Abs: 3840 cells/uL (ref 1500–7800)
Neutrophils Relative %: 60 %
Platelets: 247 10*3/uL (ref 140–400)
RBC: 4.58 MIL/uL (ref 3.80–5.10)
RDW: 14.4 % (ref 11.0–15.0)
WBC: 6.4 10*3/uL (ref 4.0–10.5)

## 2016-04-25 LAB — LIPID PANEL
Cholesterol: 177 mg/dL (ref <200)
HDL: 49 mg/dL — ABNORMAL LOW (ref >50)
LDL Cholesterol: 100 mg/dL — ABNORMAL HIGH (ref <100)
Total CHOL/HDL Ratio: 3.6 Ratio (ref <5.0)
Triglycerides: 139 mg/dL (ref <150)
VLDL: 28 mg/dL (ref <30)

## 2016-04-25 LAB — TSH: TSH: 2.08 mIU/L

## 2016-04-25 NOTE — Patient Instructions (Addendum)
HEALTH MAINTENANCE RECOMMENDATIONS:  It is recommended that you get at least 30 minutes of aerobic exercise at least 5 days/week (for weight loss, you may need as much as 60-90 minutes). This can be any activity that gets your heart rate up. This can be divided in 10-15 minute intervals if needed, but try and build up your endurance at least once a week.  Weight bearing exercise is also recommended twice weekly.  Eat a healthy diet with lots of vegetables, fruits and fiber.  "Colorful" foods have a lot of vitamins (ie green vegetables, tomatoes, red peppers, etc).  Limit sweet tea, regular sodas and alcoholic beverages, all of which has a lot of calories and sugar.  Up to 1 alcoholic drink daily may be beneficial for women (unless trying to lose weight, watch sugars).  Drink a lot of water.  Calcium recommendations are 1200-1500 mg daily (1500 mg for postmenopausal women or women without ovaries), and vitamin D 1000 IU daily.  This should be obtained from diet and/or supplements (vitamins), and calcium should not be taken all at once, but in divided doses.  Monthly self breast exams and yearly mammograms for women over the age of 27 is recommended.  Sunscreen of at least SPF 30 should be used on all sun-exposed parts of the skin when outside between the hours of 10 am and 4 pm (not just when at beach or pool, but even with exercise, golf, tennis, and yard work!)  Use a sunscreen that says "broad spectrum" so it covers both UVA and UVB rays, and make sure to reapply every 1-2 hours.  Remember to change the batteries in your smoke detectors when changing your clock times in the spring and fall.  Use your seat belt every time you are in a car, and please drive safely and not be distracted with cell phones and texting while driving.   [name] , Thank you for taking time to come for your Medicare Wellness Visit. I appreciate your ongoing commitment to your health goals. Please review the following  plan we discussed and let me know if I can assist you in the future.   These are the goals we discussed: Goals    None      This is a list of the screening recommended for you and due dates:  Health Maintenance  Topic Date Due  . DEXA scan (bone density measurement)  10/07/2009  . Flu Shot  12/12/2015  . Mammogram  01/16/2018  . Colon Cancer Screening  06/15/2019  . Tetanus Vaccine  06/19/2021  . Shingles Vaccine  Completed  .  Hepatitis C: One time screening is recommended by Center for Disease Control  (CDC) for  adults born from [birth year] through 1966.   Completed  . Pneumonia vaccines  Completed   We gave you your flu shot today. Your next mammogram is due September 2018 (not 2019 as stated above). Shingrix is a new shingles vaccine that was just approved but not yet available.  I highly recommend this, even though you already had the other shingles vaccine.  In a few months, check with your insurance to see if it is covered, and if so, if it is covered by Part D medicare, in which case you need to get it from the pharmacy, and not from our office.  It is a series of 2 shots given 2 months apart.  You are past due for colonoscopy (was recommended to be repeated in 2016 due to having a  polyp in 2011--ignore the date above which is simply 10 years from your last one).  I will work to get the pathology report from the polyp from 2011.  An alternative screening is Cologard testing (fecal testing that Medicare covers)--this is recommended for low-average risk people.  If the testing is abnormal, you MUST have a colonoscopy to follow up on it, as it means you likely have more polyps (vs pre-cancer vs cancer).   Please schedule a bone density test with [facility], where you get your mammograms. The one you had in the past was likely long enough ago, that it is worth checking again.

## 2016-04-26 ENCOUNTER — Encounter: Payer: Self-pay | Admitting: Family Medicine

## 2016-04-26 ENCOUNTER — Telehealth: Payer: Self-pay

## 2016-04-26 NOTE — Telephone Encounter (Signed)
Pt called the office to let you know she gets her scripts at [facility]. [phone].  Record updated. Sylvia Gutierrez

## 2016-05-02 ENCOUNTER — Other Ambulatory Visit: Payer: Self-pay | Admitting: Family Medicine

## 2016-05-02 ENCOUNTER — Telehealth: Payer: Self-pay

## 2016-05-02 DIAGNOSIS — I10 Essential (primary) hypertension: Secondary | ICD-10-CM

## 2016-05-02 MED ORDER — ATENOLOL 25 MG PO TABS: 25.0000 mg | ORAL_TABLET | Freq: Every day | ORAL | 0 refills | Status: DC

## 2016-05-02 NOTE — Telephone Encounter (Signed)
Pt request 30 day supply of atenolol to [facility] [place] until shipment rcvd from [facility].

## 2016-05-09 DIAGNOSIS — Z780 Asymptomatic menopausal state: Secondary | ICD-10-CM | POA: Diagnosis not present

## 2016-05-09 LAB — HM DEXA SCAN: HM Dexa Scan: NORMAL

## 2016-05-22 ENCOUNTER — Telehealth: Payer: Self-pay | Admitting: *Deleted

## 2016-05-22 NOTE — Telephone Encounter (Signed)
Called patient and asked her to please call and schedule appt for colonoscopy with Dr.Hung-Cologuard NOT recommended due to type of polyp she had on past colonoscopy.

## 2016-05-28 ENCOUNTER — Telehealth: Payer: Self-pay

## 2016-05-28 DIAGNOSIS — E7800 Pure hypercholesterolemia, unspecified: Secondary | ICD-10-CM

## 2016-05-28 DIAGNOSIS — I10 Essential (primary) hypertension: Secondary | ICD-10-CM

## 2016-05-28 MED ORDER — PRAVASTATIN SODIUM 40 MG PO TABS: ORAL_TABLET | ORAL | 1 refills | Status: DC

## 2016-05-28 MED ORDER — ATENOLOL 25 MG PO TABS: 25.0000 mg | ORAL_TABLET | Freq: Every day | ORAL | 1 refills | Status: DC

## 2016-05-28 NOTE — Telephone Encounter (Signed)
Ok to refill both for 6 mos (seen in December with labs)

## 2016-05-28 NOTE — Telephone Encounter (Signed)
rx renewed,

## 2016-05-28 NOTE — Telephone Encounter (Signed)
Needs refill of pravastatin called to [facility].

## 2016-05-28 NOTE — Telephone Encounter (Signed)
And atenolol please

## 2016-06-10 ENCOUNTER — Telehealth: Payer: Self-pay

## 2016-06-10 NOTE — Telephone Encounter (Signed)
colonoscopy report rcvd from Dr. Lorie Apley office placed in your folder for review. Sylvia Gutierrez

## 2016-06-11 ENCOUNTER — Telehealth: Payer: Self-pay

## 2016-06-11 NOTE — Telephone Encounter (Signed)
Pt questions if she can get injection in her right knee.  Advised her she is due for colonoscopy and she informs me that she is already scheduled for repeat 06/25/2016 with Dr. Benson Norway. Victorino December

## 2016-06-11 NOTE — Telephone Encounter (Signed)
LMTCB

## 2016-06-11 NOTE — Telephone Encounter (Signed)
I can try--it would be best to schedule on a day that Dr. Redmond School is also here, so that if I have any trouble getting into the joint space, we can have him try as well.

## 2016-06-13 ENCOUNTER — Ambulatory Visit (INDEPENDENT_AMBULATORY_CARE_PROVIDER_SITE_OTHER): Payer: Medicare Other | Admitting: Family Medicine

## 2016-06-13 ENCOUNTER — Encounter: Payer: Self-pay | Admitting: Family Medicine

## 2016-06-13 VITALS — BP 130/78 | HR 72 | Ht 61.0 in | Wt 190.0 lb

## 2016-06-13 DIAGNOSIS — M25561 Pain in right knee: Secondary | ICD-10-CM

## 2016-06-13 DIAGNOSIS — I10 Essential (primary) hypertension: Secondary | ICD-10-CM

## 2016-06-13 MED ORDER — BUPIVACAINE HCL 0.5 % IJ SOLN
4.0000 mL | Freq: Once | INTRAMUSCULAR | Status: AC
  Administered 2016-06-13: 4 mL

## 2016-06-13 MED ORDER — TRIAMCINOLONE ACETONIDE 40 MG/ML IJ SUSP
30.0000 mg | Freq: Once | INTRAMUSCULAR | Status: AC
  Administered 2016-06-13: 30 mg via INTRA_ARTICULAR

## 2016-06-13 MED ORDER — LIDOCAINE HCL 2 % IJ SOLN
4.0000 mL | Freq: Once | INTRAMUSCULAR | Status: AC
  Administered 2016-06-13: 80 mg

## 2016-06-13 NOTE — Progress Notes (Signed)
Chief Complaint  Patient presents with  . Knee Pain    right knee pain, requesting cortisone injection.     She has been having intermittent pain in the right knee for the last year.  Denies giving way or locking. No known injury.  Previously ibuprofen was helpful, had been taking 400mg  once daily; doesn't seem to work as well as in the past, needing it every day.  Knee hurts worse after going to the gym, with some swelling (bike and elliptical).  She had cortisone shot to the left knee in the past, which was very effective, and she is asking for injection to the right knee today.  X-rays of right knee were ordered in April, not done X-rays of left knee from 02/2014 showed: FINDINGS: Four views of the left these tube VT. No acute fracture or subluxation. Mild narrowing of medial joint compartment. Minimal spurring of medial femoral condyle and medial tibial plateau. Mild narrowing of patellofemoral joint space.  IMPRESSION: No acute fracture or subluxation.  Mild osteoarthritic changes.  PMH, PSH, SH reviewed  Outpatient Encounter Prescriptions as of 06/13/2016  Medication Sig  . aspirin 81 MG tablet Take 81 mg by mouth daily.    Marland Kitchen atenolol (TENORMIN) 25 MG tablet Take 1 tablet (25 mg total) by mouth daily.  . cholecalciferol (VITAMIN D) 1000 UNITS tablet Take 1,000 Units by mouth daily.  . fish oil-omega-3 fatty acids 1000 MG capsule Take 1 g by mouth daily.   Marland Kitchen lisinopril-hydrochlorothiazide (PRINZIDE,ZESTORETIC) 20-12.5 MG tablet TAKE 1 TABLET BY MOUTH  DAILY  . pravastatin (PRAVACHOL) 40 MG tablet Take 1 tablet by mouth  daily  . [EXPIRED] bupivacaine (MARCAINE) 0.5 % (with pres) injection 4 mL   . [EXPIRED] lidocaine (XYLOCAINE) 2 % (with pres) injection 80 mg   . [EXPIRED] triamcinolone acetonide (KENALOG-40) injection 30 mg    No facility-administered encounter medications on file as of 06/13/2016.    No Known Allergies  ROS: no fever, chills, URI symptoms, cough, shortness  of breath, chest pain, nausea, vomiting, abdominal pain, bleeding, bruising, rash.  +right knee pain as per HPI.  PHYSICAL EXAM:  BP 130/78 (BP Location: Left Arm, Patient Position: Sitting, Cuff Size: Normal)   Pulse 72   Ht 5\' 1"  (1.549 m)   Wt 190 lb (86.2 kg)   BMI 35.90 kg/m   Well appearing, pleasant female in no distress Examination of the right knee: FROM, some discomfort medially with flexion. No effusion or warmth. nontender at joint lines (but area of discomfort, when in pain, is near medial joint line). Negative Lachman, drawer, [place]. Only mild crepitus.  Procedure: Knee was prepped with betadine swabs x 3.   Kenalog 30mg , 4cc each of  Marcaine and lidocaine were injection into the right knee joint medially.  She tolerated the procedure well, without complication, and noted improvement--lack of pain with walking after the procedure  ASSESSMENT/PLAN:  Right knee pain, unspecified chronicity - suspect degenerative, no internal derangement. Treated with cortisone injection - Plan: lidocaine (XYLOCAINE) 2 % (with pres) injection 80 mg, triamcinolone acetonide (KENALOG-40) injection 30 mg, bupivacaine (MARCAINE) 0.5 % (with pres) injection 4 mL  Essential hypertension, benign - well controlled   Advised to take it easy (no gym) today and tomorrow.  Discussed natural course of improvement.  If ongoing pain, will need x-ray.  Nothing to suggest internal derangement on exam.  Suspect degenerative changes.

## 2016-06-13 NOTE — Patient Instructions (Signed)
Knee Injection, Care After Refer to this sheet in the next few weeks. These instructions provide you with information about caring for yourself after your procedure. Your health care provider may also give you more specific instructions. Your treatment has been planned according to current medical practices, but problems sometimes occur. Call your health care provider if you have any problems or questions after your procedure. What can I expect after the procedure? After the procedure, it is common to have:  Soreness.  Warmth.  Swelling. You may have more pain, swelling, and warmth than you did before the injection. This reaction may last for about one day. Follow these instructions at home: Bathing   If you were given a bandage (dressing), keep it dry until your health care provider says it can be removed. Ask your health care provider when you can start showering or taking a bath. Managing pain, stiffness, and swelling   If directed, apply ice to the injection area:  Put ice in a plastic bag.  Place a towel between your skin and the bag.  Leave the ice on for 20 minutes, 2-3 times per day.  Do not apply heat to your knee.  Raise the injection area above the level of your heart while you are sitting or lying down. Activity   Avoid strenuous activities for as long as directed by your health care provider. Ask your health care provider when you can return to your normal activities. General instructions   Take medicines only as directed by your health care provider.  Do not take aspirin or other over-the-counter medicines unless your health care provider says you can.  Check your injection site every day for signs of infection. Watch for:  Redness, swelling, or pain.  Fluid, blood, or pus.  Follow your health care provider's instructions about dressing changes and removal. Contact a health care provider if:  You have symptoms at your injection site that last longer than  two days after your procedure.  You have redness, swelling, or pain in your injection area.  You have fluid, blood, or pus coming from your injection site.  You have warmth in your injection area.  You have a fever.  Your pain is not controlled with medicine. Get help right away if:  Your knee turns very red.  Your knee becomes very swollen.  Your knee pain is severe. This information is not intended to replace advice given to you by your health care provider. Make sure you discuss any questions you have with your health care provider. Document Released: 05/20/2014 Document Revised: 01/03/2016 Document Reviewed: 03/09/2014 Elsevier Interactive Patient Education  2017 [employer] Inc.  

## 2016-07-01 ENCOUNTER — Telehealth: Payer: Self-pay | Admitting: Family Medicine

## 2016-07-01 DIAGNOSIS — I10 Essential (primary) hypertension: Secondary | ICD-10-CM

## 2016-07-01 MED ORDER — ATENOLOL 25 MG PO TABS: 25.0000 mg | ORAL_TABLET | Freq: Every day | ORAL | 0 refills | Status: DC

## 2016-07-01 NOTE — Telephone Encounter (Signed)
Pt called and req refill Atenolol to [facility] [address]

## 2016-07-01 NOTE — Telephone Encounter (Signed)
Done

## 2016-09-01 ENCOUNTER — Other Ambulatory Visit: Payer: Self-pay | Admitting: Family Medicine

## 2016-09-01 DIAGNOSIS — E7800 Pure hypercholesterolemia, unspecified: Secondary | ICD-10-CM

## 2016-10-14 ENCOUNTER — Telehealth: Payer: Self-pay | Admitting: Family Medicine

## 2016-10-14 DIAGNOSIS — I10 Essential (primary) hypertension: Secondary | ICD-10-CM

## 2016-10-14 MED ORDER — LISINOPRIL-HYDROCHLOROTHIAZIDE 20-12.5 MG PO TABS: 1.0000 | ORAL_TABLET | Freq: Every day | ORAL | 0 refills | Status: DC

## 2016-10-14 NOTE — Telephone Encounter (Signed)
Recv'd fax requesting Lisinopril-HCTZ 20-12.5 #90 to [place]

## 2016-10-21 ENCOUNTER — Other Ambulatory Visit: Payer: Self-pay | Admitting: Family Medicine

## 2016-10-27 NOTE — Progress Notes (Signed)
Chief Complaint  Patient presents with  . Medication Management    HTN, Lipids. c/o right knee pain. pt is fasting.    She is complaining of pain in both knees, the right is the worst.  She had injections, but they weren't very helpful. She has knee pain daily, hurts to walk, is stiff when she gets out of bed.  Exercise seems to help some.  Denies knee swelling. Denies giving way or locking. She hasn't been taking any ibuprofen or medication, didn't find Advil to be helpful in the past.  She rides the bike at the gym, which doesn't bother her.  Last injection was to the right knee in 06/2016.  Only short-term benefit.  She had injection into the left knee in the past, which was effective.  X-rays of left knee from 02/2014 showed: FINDINGS: Four views of the left these tube VT. No acute fracture or subluxation. Mild narrowing of medial joint compartment. Minimal spurring of medial femoral condyle and medial tibial plateau. Mild narrowing of patellofemoral joint space. IMPRESSION: No acute fracture or subluxation. Mild osteoarthritic changes.  Hypertension follow-up:  BP's are checked occasionally at home, cannot recall the values but think they are okay. Denies dizziness, headaches, chest pain. Denies side effects of medications. She still has some ankle swelling, which gets worse throughout the day (R>L), unchanged from previous. She tries to follow low sodium diet.  Hyperlipidemia follow-up: Patient is reportedly trying to following a low-fat, low cholesterol diet. Compliant with medications and denies medication side effects.  Lab Results  Component Value Date   CHOL 177 04/25/2016   HDL 49 (L) 04/25/2016   LDLCALC 100 (H) 04/25/2016   TRIG 139 04/25/2016   CHOLHDL 3.6 04/25/2016   Colon cancer screening: She reports she saw gastroenterologist over the winter--doesn't think they did the procedure (??).  Cannot recall the location, but has paperwork at home.  +FH colon cancer (dad);  polyp on last scope 06/2009--pathology showed tubular adenoma (and was due to f/u in 5 years).  PMH, PSH, SH reviewed  Outpatient Encounter Prescriptions as of 10/28/2016  Medication Sig  . aspirin 81 MG tablet Take 81 mg by mouth daily.    Marland Kitchen atenolol (TENORMIN) 25 MG tablet Take 1 tablet (25 mg total) by mouth daily.  . cholecalciferol (VITAMIN D) 1000 UNITS tablet Take 1,000 Units by mouth daily.  . fish oil-omega-3 fatty acids 1000 MG capsule Take 2 g by mouth daily.   Marland Kitchen lisinopril-hydrochlorothiazide (PRINZIDE,ZESTORETIC) 20-12.5 MG tablet Take 1 tablet by mouth daily.  . pravastatin (PRAVACHOL) 40 MG tablet TAKE 1 TABLET BY MOUTH  DAILY  . [DISCONTINUED] pravastatin (PRAVACHOL) 40 MG tablet Take 1 tablet by mouth  daily  . [DISCONTINUED] pravastatin (PRAVACHOL) 40 MG tablet TAKE 1 TABLET BY MOUTH  DAILY   No facility-administered encounter medications on file as of 10/28/2016.    No Known Allergies  ROS: no fever, chills, URI symptoms, GI complaints, bleeding/bruising, rash, headaches, dizziness, chest pain or other concerns except as noted in HPI.    PHYSICAL EXAM:  BP 132/82   Pulse (!) 55   Resp 18   Ht '5\' 1"'  (1.549 m)   Wt 191 lb (86.6 kg)   SpO2 96%   BMI 36.09 kg/m    Wt Readings from Last 3 Encounters:  10/28/16 191 lb (86.6 kg)  06/13/16 190 lb (86.2 kg)  04/25/16 189 lb 6.4 oz (85.9 kg)    Well developed, pleasant female in no distress Heart: regular  rate and rhythm without murmur HEENT: OP--dentures, moist mucus membranes. Conjunctiva and sclera clear Lungs: clear bilaterally Extremities: no edema Neuro: alert and oriented, normal gait Psych: normal mood, affect, hygiene and grooming Skin: no visible rashes/lesions.  Right knee: Some pain medially with valgus stress (over MCL) No effusion, warmth. No crepitus, negative Drawer.   ASSESSMENT/PLAN:  Essential hypertension, benign - controlled - Plan: Comprehensive metabolic panel, atenolol (TENORMIN)  25 MG tablet  Pure hypercholesterolemia - controlled per last check; continue statin and lowfat, low cholesterol diet  Obesity (BMI 30-39.9) - counseled re: proper diet, healthy choices, portions, weight loss  Colon cancer screening - unclear if actually had colonoscopy--pt to call with name of clinic so we can see what occurred when she went this past winter  Chronic pain of both knees - refer to ortho.  Check x-rays. - Plan: DG Knee 3 Views Right, DG Knee 3 Views Left, Ambulatory referral to Orthopedic Surgery   c-met shingrix discussed and recommended Colonoscopy--get records--sounds like she saw GI, but doesn't sound like she had procedure.    Refer to ortho for bilateral knee pain, R>L X-rays today

## 2016-10-28 ENCOUNTER — Encounter: Payer: Self-pay | Admitting: Family Medicine

## 2016-10-28 ENCOUNTER — Ambulatory Visit (INDEPENDENT_AMBULATORY_CARE_PROVIDER_SITE_OTHER): Payer: Medicare Other | Admitting: Family Medicine

## 2016-10-28 ENCOUNTER — Ambulatory Visit
Admission: RE | Admit: 2016-10-28 | Discharge: 2016-10-28 | Disposition: A | Discharge status: Home / Self Care | Payer: Medicare Other | Source: Ambulatory Visit | Attending: Family Medicine | Admitting: Family Medicine

## 2016-10-28 VITALS — BP 132/82 | HR 55 | Resp 18 | Ht 61.0 in | Wt 191.0 lb

## 2016-10-28 DIAGNOSIS — M25562 Pain in left knee: Secondary | ICD-10-CM

## 2016-10-28 DIAGNOSIS — E669 Obesity, unspecified: Secondary | ICD-10-CM | POA: Diagnosis not present

## 2016-10-28 DIAGNOSIS — E7800 Pure hypercholesterolemia, unspecified: Secondary | ICD-10-CM

## 2016-10-28 DIAGNOSIS — G8929 Other chronic pain: Secondary | ICD-10-CM | POA: Diagnosis not present

## 2016-10-28 DIAGNOSIS — M25561 Pain in right knee: Secondary | ICD-10-CM

## 2016-10-28 DIAGNOSIS — Z1211 Encounter for screening for malignant neoplasm of colon: Secondary | ICD-10-CM

## 2016-10-28 DIAGNOSIS — I10 Essential (primary) hypertension: Secondary | ICD-10-CM

## 2016-10-28 LAB — COMPREHENSIVE METABOLIC PANEL
ALT: 12 U/L (ref 6–29)
AST: 17 U/L (ref 10–35)
Albumin: 3.9 g/dL (ref 3.6–5.1)
Alkaline Phosphatase: 61 U/L (ref 33–130)
BUN: 23 mg/dL (ref 7–25)
CO2: 28 mmol/L (ref 20–31)
Calcium: 9.1 mg/dL (ref 8.6–10.4)
Chloride: 103 mmol/L (ref 98–110)
Creat: 0.99 mg/dL — ABNORMAL HIGH (ref 0.60–0.93)
Glucose, Bld: 92 mg/dL (ref 65–99)
Potassium: 3.8 mmol/L (ref 3.5–5.3)
Sodium: 140 mmol/L (ref 135–146)
Total Bilirubin: 0.6 mg/dL (ref 0.2–1.2)
Total Protein: 6.7 g/dL (ref 6.1–8.1)

## 2016-10-28 MED ORDER — ATENOLOL 25 MG PO TABS: 25.0000 mg | ORAL_TABLET | Freq: Every day | ORAL | 1 refills | Status: DC

## 2016-10-28 NOTE — Patient Instructions (Addendum)
  I recommend getting the new shingles vaccine (Shingrix). You will need to check with your insurance to see if it is covered, and if covered by Medicare Part D, you need to get from the pharmacy rather than our office.  It is a series of 2 injections, spaced 2 months apart.  Please call us back today with the name of the gastroenterology clinic that you went to so that we can see what they did--it doesn't sound as though you had the colonoscopy that you need to have.  Please go to [facility] imaging today to get the x-rays of your knee ([address]).

## 2016-10-29 ENCOUNTER — Encounter: Payer: Self-pay | Admitting: Family Medicine

## 2016-11-04 ENCOUNTER — Encounter: Payer: Self-pay | Admitting: Family Medicine

## 2016-11-04 ENCOUNTER — Ambulatory Visit (INDEPENDENT_AMBULATORY_CARE_PROVIDER_SITE_OTHER): Payer: Medicare Other | Admitting: Family Medicine

## 2016-11-04 VITALS — BP 120/74 | HR 64 | Temp 97.7°F | Ht 61.0 in | Wt 193.0 lb

## 2016-11-04 DIAGNOSIS — L723 Sebaceous cyst: Secondary | ICD-10-CM | POA: Diagnosis not present

## 2016-11-04 NOTE — Patient Instructions (Signed)
Epidermal Cyst An epidermal cyst is sometimes called an epidermal inclusion cyst or an infundibular cyst. It is a sac made of skin tissue. The sac contains a substance called keratin. Keratin is a protein that is normally secreted through the hair follicles. When keratin becomes trapped in the top layer of skin (epidermis), it can form an epidermal cyst. Epidermal cysts are usually found on the face, neck, trunk, and genitals. These cysts are usually harmless (benign), and they may not cause symptoms unless they become infected. It is important not to pop epidermal cysts yourself. What are the causes? This condition may be caused by:  A blocked hair follicle.  A hair that curls and re-enters the skin instead of growing straight out of the skin (ingrown hair).  A blocked pore.  Irritated skin.  An injury to the skin.  Certain conditions that are passed along from parent to child (inherited).  Human papillomavirus (HPV).  What increases the risk? The following factors may make you more likely to develop an epidermal cyst:  Having acne.  Being overweight.  Wearing tight clothing.  What are the signs or symptoms? The only symptom of this condition may be a small, painless lump underneath the skin. When an epidermal cyst becomes infected, symptoms may include:  Redness.  Inflammation.  Tenderness.  Warmth.  Fever.  Keratin draining from the cyst. Keratin may look like a grayish-white, bad-smelling substance.  Pus draining from the cyst.  How is this diagnosed? This condition is diagnosed with a physical exam. In some cases, you may have a sample of tissue (biopsy) taken from your cyst to be examined under a microscope or tested for bacteria. You may be referred to a health care provider who specializes in skin care (dermatologist). How is this treated? In many cases, epidermal cysts go away on their own without treatment. If a cyst becomes infected, treatment may  include:  Opening and draining the cyst. After draining, minor surgery to remove the rest of the cyst may be done.  Antibiotic medicine to help prevent infection.  Injections of medicines (steroids) that help to reduce inflammation.  Surgery to remove the cyst. Surgery may be done if: ? The cyst becomes large. ? The cyst bothers you. ? There is a chance that the cyst could turn into cancer.  Follow these instructions at home:  Take over-the-counter and prescription medicines only as told by your health care provider.  If you were prescribed an antibiotic, use it as told by your health care provider. Do not stop using the antibiotic even if you start to feel better.  Keep the area around your cyst clean and dry.  Wear loose, dry clothing.  Do not try to pop your cyst.  Avoid touching your cyst.  Check your cyst every day for signs of infection.  Keep all follow-up visits as told by your health care provider. This is important. How is this prevented?  Wear clean, dry, clothing.  Avoid wearing tight clothing.  Keep your skin clean and dry. Shower or take baths every day.  Wash your body with a benzoyl peroxide wash when you shower or bathe. Contact a health care provider if:  Your cyst develops symptoms of infection.  Your condition is not improving or is getting worse.  You develop a cyst that looks different from other cysts you have had.  You have a fever. Get help right away if:  Redness spreads from the cyst into the surrounding area. This information is   not intended to replace advice given to you by your health care provider. Make sure you discuss any questions you have with your health care provider. Document Released: 03/30/2004 Document Revised: 12/27/2015 Document Reviewed: 03/01/2015 Elsevier Interactive Patient Education  2018 [employer] Inc[employer]  

## 2016-11-04 NOTE — Progress Notes (Signed)
Chief Complaint  Patient presents with  . Nevus    thinks she has a mole on the left side of her back. Noticed about 2 weeks ago in the shower. Daughter visibly states that it is a mole. Does bother her when she sits back, uncomfortable and slightly sore. Forgot to tell you when she was here last Monday.    Her daughter said she has a large mole on her back, noted 2 weeks ago. She can't see it or feel it.  Wants to have it checked (and forgot to ask at her last visit).  Denies significant itching or discomfort--sometimes has a little of both.  Tylenol is helping for her knee pain--she is very happy about this.  She has ortho appointment later this week.  PMH, PSH, SH reviewed  Outpatient Encounter Prescriptions as of 11/04/2016  Medication Sig  . aspirin 81 MG tablet Take 81 mg by mouth daily.    Marland Kitchen atenolol (TENORMIN) 25 MG tablet Take 1 tablet (25 mg total) by mouth daily.  . cholecalciferol (VITAMIN D) 1000 UNITS tablet Take 1,000 Units by mouth daily.  . fish oil-omega-3 fatty acids 1000 MG capsule Take 2 g by mouth daily.   Marland Kitchen lisinopril-hydrochlorothiazide (PRINZIDE,ZESTORETIC) 20-12.5 MG tablet Take 1 tablet by mouth daily.  . pravastatin (PRAVACHOL) 40 MG tablet TAKE 1 TABLET BY MOUTH  DAILY   No facility-administered encounter medications on file as of 11/04/2016.    No Known Allergies  ROS: no fever, chills, rash.  Knee pain improved with tylenol. Moods are good. No other complaints  PHYSICAL EXAM:  BP 120/74 (BP Location: Left Arm, Patient Position: Sitting, Cuff Size: Normal)   Pulse 64   Temp 97.7 F (36.5 C) (Tympanic)   Ht 5\' 1"  (1.549 m)   Wt 193 lb (87.5 kg)   BMI 36.47 kg/m   Well developed, pleasant female in good spirits Back:  Just under 1cm soft tissue mass with dark pore noted inferiorly. Consistent with sebaceous cyst.  No fluctuance, erythema, nontender.  Picture taken on patient's phone to show her (as she couldn't really visualize this  area).  ASSESSMENT/PLAN:  Sebaceous cyst   Educated regarding potential course, s/sx infection

## 2016-11-07 ENCOUNTER — Ambulatory Visit (INDEPENDENT_AMBULATORY_CARE_PROVIDER_SITE_OTHER): Payer: Medicare Other | Admitting: Orthopedic Surgery

## 2016-11-07 DIAGNOSIS — M25562 Pain in left knee: Secondary | ICD-10-CM

## 2016-11-07 DIAGNOSIS — M25561 Pain in right knee: Secondary | ICD-10-CM | POA: Diagnosis not present

## 2016-11-08 ENCOUNTER — Telehealth (INDEPENDENT_AMBULATORY_CARE_PROVIDER_SITE_OTHER): Payer: Self-pay

## 2016-11-08 NOTE — Telephone Encounter (Signed)
IC patient to discuss synvisc one injection and she stated she could not afford it at this time. Her insurance will cover 80% leaving her to be responsible for the remaining. She stated she is unable to pay this so will not be having injections.

## 2016-11-09 ENCOUNTER — Encounter (INDEPENDENT_AMBULATORY_CARE_PROVIDER_SITE_OTHER): Payer: Self-pay | Admitting: Orthopedic Surgery

## 2016-11-09 NOTE — Progress Notes (Signed)
Office Visit Note   Patient: Sylvia Gutierrez           Date of Birth: [birth year]           MRN: [id number] Visit Date: 11/07/2016 Requested by: Rita Ohara, [address] [place], [place] [address] PCP: Rita Ohara, MD  Subjective: Chief Complaint  Patient presents with  . Right Knee - Pain  . Left Knee - Pain    HPI: Sylvia Gutierrez is a 71 year old female bilateral knee pain right worse on left.  Reports pain for the last 3-4 months.  Describes the pain is constant but only occurs with ambulation.  She denies any weakness giving way locking but does report some popping.  Takes Tylenol for pain.  The burning type pain.  She is able to walk a mile.,  Helps.  She did have right knee cortisone injection 2 months ago which did not help.  Reports pain primarily on the medial aspect of both knees.              ROS: All systems reviewed are negative as they relate to the chief complaint within the history of present illness.  Patient denies  fevers or chills.   Assessment & Plan: Visit Diagnoses:  1. Pain in both knees, unspecified chronicity     Plan: Impression is bilateral knee arthritis with primarily medial compartment joint space narrowing on plain radiographs.  She's not that symptomatic enough yet for knee replacement.  Cortisone shot has been tried but didn't help.  She may be a reasonable candidate to try bilateral Synvisc injections area and I think that may give her enough relief that she can continue on.  Eventually she might need knee replacement but she would be a candidate for unicompartmental replacement.  I'll see her back in 2-3 weeks to inject the Synvisc.  Follow-Up Instructions: No Follow-up on file.   Orders:  No orders of the defined types were placed in this encounter.  No orders of the defined types were placed in this encounter.     Procedures: No procedures performed   Clinical Data: No additional findings.  Objective: Vital Signs: There were no vitals  taken for this visit.  Physical Exam:   Constitutional: Patient appears well-developed HEENT:  Head: Normocephalic Eyes:EOM are normal Neck: Normal range of motion Cardiovascular: Normal rate Pulmonary/chest: Effort normal Neurologic: Patient is alert Skin: Skin is warm Psychiatric: Patient has normal mood and affect    Ortho Exam: Orthopedic exam demonstrates good range of motion both knees with no effusion.  There is medial greater than lateral joint line tenderness bilaterally.  Extensor mechanism is intact.  Range of motion is full.  Pedal pulses palpable.  No other masses lymph adenopathy or skin changes noted in either knee region.  Mild patellofemoral crepitus is present.  Specialty Comments:  No specialty comments available.  Imaging: No results found.   PMFS History: Patient Active Problem List   Diagnosis Date Noted  . Primary osteoarthritis of left knee 02/25/2014  . Obesity (BMI 30-39.9) 03/05/2013  . Essential hypertension, benign 11/28/2010  . Pure hypercholesterolemia 11/28/2010   Past Medical History:  Diagnosis Date  . Anemia   . Colon polyp    colonoscopy 06/2009, due again 2016  . Diverticulosis   . Hemorrhoids   . Hyperlipidemia   . Hypertension   . Onychomycosis     Family History  Problem Relation Age of Onset  . Hypertension Mother   . Cancer Father  colon (60's)  . Colon cancer Father   . Cancer Sister        throat  . Hypertension Brother   . Diabetes Brother   . Hypertension Brother   . Hypertension Brother   . Cancer Brother        prostate  . Breast cancer Neg Hx   . Heart disease Neg Hx     Past Surgical History:  Procedure Laterality Date  . ABDOMINAL HYSTERECTOMY     for bleeding/benign  . CARPAL TUNNEL RELEASE Right    Social History   Occupational History  . MACHINE OPERATOR--Retired  [employer]   Social History Main Topics  . Smoking status: Former Smoker    Quit date: 05/13/2005  . Smokeless tobacco:  Never Used  . Alcohol use No  . Drug use: No  . Sexual activity: Not Currently

## 2016-12-11 ENCOUNTER — Telehealth: Payer: Self-pay

## 2016-12-11 ENCOUNTER — Encounter: Payer: Self-pay | Admitting: Family Medicine

## 2016-12-11 NOTE — Telephone Encounter (Signed)
I called Dr. Ulyses Amor office to follow-up on pt's medical record request signed in June for colonoscopy. Pt informed me that she was scheduled for colonoscopy in Feb 2018, but Dr. Ulyses Amor office states she has not had colonoscopy since 2011. Sending to you as Juluis Rainier. Victorino December

## 2017-01-20 DIAGNOSIS — Z1231 Encounter for screening mammogram for malignant neoplasm of breast: Secondary | ICD-10-CM | POA: Diagnosis not present

## 2017-01-20 LAB — HM MAMMOGRAPHY

## 2017-03-01 ENCOUNTER — Other Ambulatory Visit: Payer: Self-pay | Admitting: Family Medicine

## 2017-03-01 DIAGNOSIS — E7800 Pure hypercholesterolemia, unspecified: Secondary | ICD-10-CM

## 2017-03-01 DIAGNOSIS — I10 Essential (primary) hypertension: Secondary | ICD-10-CM

## 2017-03-10 ENCOUNTER — Other Ambulatory Visit: Payer: Self-pay | Admitting: Family Medicine

## 2017-03-10 DIAGNOSIS — I10 Essential (primary) hypertension: Secondary | ICD-10-CM

## 2017-05-27 ENCOUNTER — Other Ambulatory Visit: Payer: Self-pay | Admitting: Family Medicine

## 2017-05-27 DIAGNOSIS — I10 Essential (primary) hypertension: Secondary | ICD-10-CM

## 2017-05-27 DIAGNOSIS — E7800 Pure hypercholesterolemia, unspecified: Secondary | ICD-10-CM

## 2017-06-08 NOTE — Progress Notes (Signed)
Chief Complaint  Patient presents with  . Medicare Wellness    fasting AWV/CPE with pelvic. Wil schedule eye exam with Dr.Groat. Having some memory issues and read something about one of her medication and alzheimer's being a side effect.     Sylvia Gutierrez is a 72 y.o. female who presents for annual physical exam, Medicare wellness visit and follow-up on chronic medical conditions.  She has the following concerns:  She is wondering if her cholesterol medication affects her memory.  Loses her glasses, keys (misplaces them), forgets why she walks into a room.  Nothing more significant.  Not leaving stove on, getting lost. This has only started happening more recently (has been on same cholesterol meds for a while).  In June she was referred to ortho for ongoing pain in her knees, R>L. She saw Dr. Marlou Sa the end of June, who recommended Synvisc injections.  She wasn't able to afford her 20% of the cost, so didn't proceed with these injections.  She has not followed up with ortho. She reports her knees are no longer bothering her (rare, pain is tolerable, only occasionally uses a medication from [employer] "for the knees"). She had previously had injections, and was having daily pain, pain with walking and stiffness. Last injection was to the right knee in 06/2016. Only short-term benefit.  She had injection into the left knee in the past, which was effective.  Xray L knee 10/2016: FINDINGS: Degenerative changes of the left knee demonstrates slight progression. This is predominantly within the medial and patellofemoral compartment. There is no joint effusion. No acute soft tissue abnormality is present. IMPRESSION: 1. Progressive mild degenerative changes within the medial and patellofemoral compartments of the left knee.  X-ray R knee 10/2016: FINDINGS: Mild degenerative changes are present within the medial compartment of the right knee with some loss of joint space. Detail is unremarkable.  There is no joint effusion. IMPRESSION: Mild medial compartment degenerative change.  Hypertension follow-up: BP's are no longer checked at home; last checked around Christmas, cannot recall the values but think they are okay. Denies dizziness, headaches, chest pain. Denies side effects of medications. She still has some ankle swelling, which gets worse throughout the day (R>L), unchanged from previous. She tries to follow low sodium diet.  Hyperlipidemia follow-up: Patient is reportedly trying to follow a low-fat, low cholesterol diet. Compliant with medications and denies medication side effects. She is questioning whether the statin could be affecting her memory--which has been more a recent concern, has been on statin for much longer. Lab Results  Component Value Date   CHOL 177 04/25/2016   HDL 49 (L) 04/25/2016   LDLCALC 100 (H) 04/25/2016   TRIG 139 04/25/2016   CHOLHDL 3.6 04/25/2016   Colon cancer screening: She reports she saw gastroenterologist last winter. We called over to Dr. Ulyses Amor office, reports last colonoscopy was 2011 (didn't have anything after)  +FH colon cancer (dad); polyp on last scope 06/2009--pathology showed tubular adenoma (and was due to f/u in 5 years).  Obesity: grape juice. +sweets/cookies  Immunization History  Administered Date(s) Administered  . Influenza, High Dose Seasonal PF 03/04/2013, 02/14/2014, 02/27/2015, 04/25/2016  . PPD Test 09/01/2013  . Pneumococcal Conjugate-13 02/14/2014  . Pneumococcal Polysaccharide-23 11/28/2010  . Td 05/13/2005  . Tdap 06/20/2011  . Zoster 01/10/2010   Last Pap smear: 07/04/08, s/p hysterectomy Last mammogram: 01/2017 Last colonoscopy: 06/14/09 (due again 06/2014); had polyp, tubular adenoma Last DEXA: pt reports having had normal DEXA scan (not  in our records, so prior to 2010)--she previously had found it, thought she gave it to [place], no longer has it.  We have no record in chart. It was "a long time ago" and  willing to have another. She reports having another DEXA sometime more recently.  We do not have results.  She goes to [facility]. Dentist: goes once yearly. She has 5 teeth left.  Ophtho: yearly, UTD. Exercise: gym 4x/week (walks, elliptical, bike, 45 minutes), no weights. Normal vitamin D level in 2014  Other doctors caring for patient include: GI: Dr. Benson Norway Ophtho: Dr. Katy Fitch Dentist: Dr. Nyoka Cowden (needs to switch, he no longer takes her insurance) Podiatrist: [facility]: Dr. Marlou Sa  Depression screen negative Fall screen negative  Functional Status screen notable for slight memory issue. see Epic for full screens.   End of Life Discussion: Patient does not havea living will and medical power of attorney. Paperwork was given and discussed reasons for doing.  Past Medical History:  Diagnosis Date  . Anemia   . Colon polyp    colonoscopy 06/2009, due again 2016  . Diverticulosis   . Hemorrhoids   . Hyperlipidemia   . Hypertension   . Onychomycosis     Past Surgical History:  Procedure Laterality Date  . ABDOMINAL HYSTERECTOMY     for bleeding/benign  . CARPAL TUNNEL RELEASE Right     Social History   Socioeconomic History  . Marital status: Married    Spouse name: Not on file  . Number of children: 4  . Years of education: Not on file  . Highest education level: Not on file  Social Needs  . Financial resource strain: Not on file  . Food insecurity - worry: Not on file  . Food insecurity - inability: Not on file  . Transportation needs - medical: Not on file  . Transportation needs - non-medical: Not on file  Occupational History  . Occupation: MACHINE OPERATOR--Retired     Employer: [employer]  Tobacco Use  . Smoking status: Former Smoker    Last attempt to quit: 05/13/2005    Years since quitting: 12.0  . Smokeless tobacco: Never Used  Substance and Sexual Activity  . Alcohol use: No  . Drug use: No  . Sexual activity: Not Currently  Other  Topics Concern  . Not on file  Social History Narrative   Retired 2011, but works part-time as a Lawyer.  Separated from her husband 2011, now divorced. Lives alone.  Has 4 children, all in [place].  7 grandchildren, 3 great grandchildren    Family History  Problem Relation Age of Onset  . Hypertension Mother   . Cancer Father        colon (16's)  . Colon cancer Father   . Cancer Sister        throat (smoker)  . Hypertension Brother   . Diabetes Brother   . Hypertension Brother   . Hypertension Brother   . Cancer Brother        prostate  . Hypertension Daughter   . Breast cancer Neg Hx   . Heart disease Neg Hx     Outpatient Encounter Medications as of 06/09/2017  Medication Sig  . aspirin 81 MG tablet Take 81 mg by mouth daily.    Marland Kitchen atenolol (TENORMIN) 25 MG tablet TAKE 1 TABLET BY MOUTH  DAILY  . cholecalciferol (VITAMIN D) 1000 UNITS tablet Take 1,000 Units by mouth daily.  . fish oil-omega-3 fatty acids  1000 MG capsule Take 2 g by mouth daily.   Marland Kitchen lisinopril-hydrochlorothiazide (PRINZIDE,ZESTORETIC) 20-12.5 MG tablet Take 1 tablet by mouth daily.  . pravastatin (PRAVACHOL) 40 MG tablet TAKE 1 TABLET BY MOUTH  DAILY  . pravastatin (PRAVACHOL) 40 MG tablet Take 1 tablet by mouth daily  . [DISCONTINUED] atenolol (TENORMIN) 25 MG tablet Take 1 tablet (25 mg total) by mouth daily.   No facility-administered encounter medications on file as of 06/09/2017.     No Known Allergies   ROS: The patient denies fever, vision changes, decreased hearing, ear pain, sore throat, breast concerns, chest pain, palpitations, dizziness, syncope, dyspnea on exertion, nausea, vomiting, diarrhea, constipation, abdominal pain, melena, hematochezia, indigestion/heartburn, hematuria, incontinence, dysuria, vaginal bleeding, discharge, odor or itch, genital lesions, weakness, tremor, suspicious skin lesions, depression, anxiety, abnormal bleeding/bruising, or enlarged lymph nodes.  Knee pain  improved, not bothersome Runny nose if she leans forward, otherwise no congestion or URI complaints. Slight memory concern per HPI.    PHYSICAL EXAM:  BP 130/72   Pulse 68   Ht 5' 1.5" (1.562 m)   Wt 189 lb 6.4 oz (85.9 kg)   BMI 35.21 kg/m   Wt Readings from Last 3 Encounters:  06/09/17 189 lb 6.4 oz (85.9 kg)  11/04/16 193 lb (87.5 kg)  10/28/16 191 lb (86.6 kg)    General Appearance:  Alert, cooperative, no distress, appears stated age.Occasional sniffling.  Head:  Normocephalic, without obvious abnormality, atraumatic.  Eyes:  PERRL, conjunctiva/corneas clear, EOM's intact, fundi not well visualized   Ears:  Normal TM's and external ear canals   Nose:  Nares normal, mucosa mildly edematous, no drainage or sinus tenderness   Throat:  Mucosa, and tongue normal; Dentures   Neck:  Supple, no lymphadenopathy; thyroid: no enlargement/tenderness/nodules; no carotid bruit or JVD   Back:  Spine nontender, no curvature, ROM normal, no CVA tenderness   Lungs:  Clear to auscultation bilaterally without wheezes, rales or ronchi; respirations unlabored   Chest Wall:  No tenderness or deformity   Heart:  Regular rate and rhythm, S1 and S2 normal, no murmur, rub or gallop   Breast Exam:  No tenderness, masses, or nipple discharge or inversion. No axillary lymphadenopathy   Abdomen:  Soft, non-tender, nondistended, normoactive bowel sounds, no masses, no hepatosplenomegaly   Genitalia:  Normal external genitalia without lesions. BUS and vagina normal; uterus surgically absent. No adnexal masses or tenderness. Pap not performed   Rectal:  Normal tone, no masses or tenderness; guaiac negative stool   Extremities:  No clubbing, cyanosis or edema..  Pulses:  2+ and symmetric all extremities   Skin:  Skin color, texture, turgor normal, no rashes or lesions   Lymph nodes:  Cervical, supraclavicular, and axillary nodes normal   Neurologic:  CNII-XII intact,  normal strength, sensation and gait; reflexes 2+ and symmetric throughout                        Psych:  Normal mood, affect, hygiene and grooming   MMSE 30/30 clockface normal   ASSESSMENT/PLAN:  Annual physical exam  Medicare annual wellness visit, subsequent  Primary osteoarthritis of both knees - improved, no longer having any significant discomfort. Pt advised to use tylenol prn (limit NSAIDs)  Essential hypertension, benign - controlled - Plan: Comprehensive metabolic panel  Pure hypercholesterolemia - due for recheck; reassured re: her statin, continue - Plan: Lipid panel  Obesity (BMI 30-39.9) - counseled re: diet, exercise, risks of obesity  Colon cancer screening - past due--h/o adenomatous polyps and FH colon cancer. Needs to schedule, pt will call Dr. Ulyses Amor office  Need for influenza vaccination - Plan: Flu vaccine HIGH DOSE PF (Fluzone High dose)  Medication monitoring encounter - Plan: CBC with Differential/Platelet, Lipid panel, Comprehensive metabolic panel  Complaints of memory disturbance - reassured of normal screening tests. Discussed routines, lists, habits, to help - Plan: TSH, Vitamin B12   DEXA--call to [facility] for last result.  If none, needs. Shingrix recommended, to get from pharmacy Colonoscopy past due--reminded to call Dr. Benson Norway and schedule  c-met, lipid, CBC, (TSH ony if sx)  MOST form reviewed and updated. Full Code, Full Care Given paperwork for Living Will and Healthcare POA, discussed reasons for completing and discussing with family, and getting [place] copies.    Discussed monthly self breast exams and yearly mammograms; at least 30 minutes of aerobic activity at least 5 days/week and weight-bearing exercise 2x/week; proper sunscreen use reviewed; healthy diet, including goals of calcium and vitamin D intake and alcohol recommendations (less than or equal to 1 drink/day) reviewed; regular seatbelt use; changing batteries in smoke detectors.  Immunization recommendations discussed, high dose flu shot given. Shingrix recommended (to get from pharmacy). Colonoscopy recommendations reviewed--past due --see above   Denies needing refills.  F/u 6 mos for med check  Medicare Attestation I have personally reviewed: The patient's medical and social history Their use of alcohol, tobacco or illicit drugs Their current medications and supplements The patient's functional ability including ADLs,fall risks, home safety risks, cognitive, and hearing and visual impairment Diet and physical activities Evidence for depression or mood disorders  The patient's weight, height and BMI have been recorded in the chart.  I have made referrals, counseling, and provided education to the patient based on review of the above and I have provided the patient with a written personalized care plan for preventive services.

## 2017-06-09 ENCOUNTER — Ambulatory Visit (INDEPENDENT_AMBULATORY_CARE_PROVIDER_SITE_OTHER): Payer: Medicare Other | Admitting: Family Medicine

## 2017-06-09 ENCOUNTER — Encounter: Payer: Self-pay | Admitting: Family Medicine

## 2017-06-09 VITALS — BP 130/72 | HR 68 | Ht 61.5 in | Wt 189.4 lb

## 2017-06-09 DIAGNOSIS — Z23 Encounter for immunization: Secondary | ICD-10-CM

## 2017-06-09 DIAGNOSIS — M170 Bilateral primary osteoarthritis of knee: Secondary | ICD-10-CM | POA: Diagnosis not present

## 2017-06-09 DIAGNOSIS — E7800 Pure hypercholesterolemia, unspecified: Secondary | ICD-10-CM | POA: Diagnosis not present

## 2017-06-09 DIAGNOSIS — Z5181 Encounter for therapeutic drug level monitoring: Secondary | ICD-10-CM

## 2017-06-09 DIAGNOSIS — E669 Obesity, unspecified: Secondary | ICD-10-CM | POA: Diagnosis not present

## 2017-06-09 DIAGNOSIS — R413 Other amnesia: Secondary | ICD-10-CM

## 2017-06-09 DIAGNOSIS — Z1211 Encounter for screening for malignant neoplasm of colon: Secondary | ICD-10-CM

## 2017-06-09 DIAGNOSIS — I10 Essential (primary) hypertension: Secondary | ICD-10-CM

## 2017-06-09 DIAGNOSIS — Z0000 Encounter for general adult medical examination without abnormal findings: Secondary | ICD-10-CM

## 2017-06-09 NOTE — Patient Instructions (Addendum)
HEALTH MAINTENANCE RECOMMENDATIONS:  It is recommended that you get at least 30 minutes of aerobic exercise at least 5 days/week (for weight loss, you may need as much as 60-90 minutes). This can be any activity that gets your heart rate up. This can be divided in 10-15 minute intervals if needed, but try and build up your endurance at least once a week.  Weight bearing exercise is also recommended twice weekly.  Eat a healthy diet with lots of vegetables, fruits and fiber.  "Colorful" foods have a lot of vitamins (ie green vegetables, tomatoes, red peppers, etc).  Limit sweet tea, regular sodas and alcoholic beverages, all of which has a lot of calories and sugar.  Up to 1 alcoholic drink daily may be beneficial for women (unless trying to lose weight, watch sugars).  Drink a lot of water.  Calcium recommendations are 1200-1500 mg daily (1500 mg for postmenopausal women or women without ovaries), and vitamin D 1000 IU daily.  This should be obtained from diet and/or supplements (vitamins), and calcium should not be taken all at once, but in divided doses.  Monthly self breast exams and yearly mammograms for women over the age of 62 is recommended.  Sunscreen of at least SPF 30 should be used on all sun-exposed parts of the skin when outside between the hours of 10 am and 4 pm (not just when at beach or pool, but even with exercise, golf, tennis, and yard work!)  Use a sunscreen that says "broad spectrum" so it covers both UVA and UVB rays, and make sure to reapply every 1-2 hours.  Remember to change the batteries in your smoke detectors when changing your clock times in the spring and fall.  Use your seat belt every time you are in a car, and please drive safely and not be distracted with cell phones and texting while driving.   Sylvia Gutierrez , Thank you for taking time to come for your Medicare Wellness Visit. I appreciate your ongoing commitment to your health goals. Please review the following  plan we discussed and let me know if I can assist you in the future.   These are the goals we discussed: Goals    None      This is a list of the screening recommended for you and due dates:  Health Maintenance  Topic Date Due  . DEXA scan (bone density measurement)  10/07/2009  . Flu Shot  12/11/2016  . Mammogram  01/21/2019  . Colon Cancer Screening  07/07/2019  . Tetanus Vaccine  06/19/2021  .  Hepatitis C: One time screening is recommended by Center for Disease Control  (CDC) for  adults born from [birth year] through 1966.   Completed  . Pneumonia vaccines  Completed   You got your flu shot today.  In the future, I recommend getting them earlier in the season (ie September or October) We are going to check with [facility] for a recent bone density test (if they don't have one, you may need to get another). Continue YEARLY mammograms--due next 01/2018 (not 2020 as stated above). You are PAST DUE for colonoscopy.  Please call Dr. Ulyses Amor office to schedule this. (you were due 06/2014)  I recommend getting the new shingles vaccine (Shingrix). You will need to check with your insurance to see if it is covered, and if covered by Medicare Part D, you need to get from the pharmacy rather than our office.  It is a series of 2 injections, spaced  2 months apart.  We discussed cutting back on sweets, carbs, portions, juices in order to help lose weight. We also spoke of adding in weight-bearing exercise at least 2x/week.

## 2017-06-10 LAB — CBC WITH DIFFERENTIAL/PLATELET
Basophils Absolute: 0 10*3/uL (ref 0.0–0.2)
Basos: 0 %
EOS (ABSOLUTE): 0 10*3/uL (ref 0.0–0.4)
Eos: 0 %
Hematocrit: 42.4 % (ref 34.0–46.6)
Hemoglobin: 13.9 g/dL (ref 11.1–15.9)
Immature Grans (Abs): 0 10*3/uL (ref 0.0–0.1)
Immature Granulocytes: 0 %
Lymphocytes Absolute: 1.8 10*3/uL (ref 0.7–3.1)
Lymphs: 27 %
MCH: 29.1 pg (ref 26.6–33.0)
MCHC: 32.8 g/dL (ref 31.5–35.7)
MCV: 89 fL (ref 79–97)
Monocytes Absolute: 0.5 10*3/uL (ref 0.1–0.9)
Monocytes: 7 %
Neutrophils Absolute: 4.4 10*3/uL (ref 1.4–7.0)
Neutrophils: 66 %
Platelets: 237 10*3/uL (ref 150–379)
RBC: 4.77 x10E6/uL (ref 3.77–5.28)
RDW: 14.9 % (ref 12.3–15.4)
WBC: 6.7 10*3/uL (ref 3.4–10.8)

## 2017-06-10 LAB — COMPREHENSIVE METABOLIC PANEL
ALT: 11 IU/L (ref 0–32)
AST: 18 IU/L (ref 0–40)
Albumin/Globulin Ratio: 1.6 (ref 1.2–2.2)
Albumin: 4.4 g/dL (ref 3.5–4.8)
Alkaline Phosphatase: 71 IU/L (ref 39–117)
BUN/Creatinine Ratio: 19 (ref 12–28)
BUN: 20 mg/dL (ref 8–27)
Bilirubin Total: 0.6 mg/dL (ref 0.0–1.2)
CO2: 25 mmol/L (ref 20–29)
Calcium: 9.7 mg/dL (ref 8.7–10.3)
Chloride: 100 mmol/L (ref 96–106)
Creatinine, Ser: 1.04 mg/dL — ABNORMAL HIGH (ref 0.57–1.00)
GFR calc Af Amer: 62 mL/min/{1.73_m2} (ref >59)
GFR calc non Af Amer: 54 mL/min/{1.73_m2} — ABNORMAL LOW (ref >59)
Globulin, Total: 2.7 g/dL (ref 1.5–4.5)
Glucose: 108 mg/dL — ABNORMAL HIGH (ref 65–99)
Potassium: 3.8 mmol/L (ref 3.5–5.2)
Sodium: 144 mmol/L (ref 134–144)
Total Protein: 7.1 g/dL (ref 6.0–8.5)

## 2017-06-10 LAB — LIPID PANEL
Chol/HDL Ratio: 3.9 ratio (ref 0.0–4.4)
Cholesterol, Total: 191 mg/dL (ref 100–199)
HDL: 49 mg/dL (ref >39)
LDL Calculated: 117 mg/dL — ABNORMAL HIGH (ref 0–99)
Triglycerides: 127 mg/dL (ref 0–149)
VLDL Cholesterol Cal: 25 mg/dL (ref 5–40)

## 2017-06-10 LAB — TSH: TSH: 1.59 u[IU]/mL (ref 0.450–4.500)

## 2017-06-10 LAB — VITAMIN B12: Vitamin B-12: 515 pg/mL (ref 232–1245)

## 2017-06-11 NOTE — Progress Notes (Signed)
Had DEXA 04/2016 and they will fax and I will put on your shelf in a folder.

## 2017-06-20 ENCOUNTER — Other Ambulatory Visit: Payer: Self-pay | Admitting: Family Medicine

## 2017-06-20 ENCOUNTER — Other Ambulatory Visit: Payer: Self-pay | Admitting: Medical

## 2017-06-20 DIAGNOSIS — I10 Essential (primary) hypertension: Secondary | ICD-10-CM

## 2017-06-20 DIAGNOSIS — E7800 Pure hypercholesterolemia, unspecified: Secondary | ICD-10-CM

## 2017-06-20 NOTE — Telephone Encounter (Signed)
Pt has an appt in july

## 2017-12-07 DIAGNOSIS — D126 Benign neoplasm of colon, unspecified: Secondary | ICD-10-CM | POA: Insufficient documentation

## 2017-12-07 NOTE — Progress Notes (Signed)
Chief Complaint  Patient presents with  . Hypertension    fasting med check. No concerns today.     Hypertension follow-up: BP'sare not checked elsewhere (home monitor isn't working, and the ones in the pharmacies haven't been working). Denies dizziness, headaches, chest pain. Denies side effects of medications. She still has some slight intermittent ankle swelling, which gets worse throughout the day (R>L), unchanged from previous. She tries to follow low sodium diet.  Hyperlipidemia follow-up: Patient is reportedly trying to follow a low-fat, low cholesterol diet. Compliant with medications and denies medication side effects. Lab Results  Component Value Date   CHOL 191 06/09/2017   HDL 49 06/09/2017   LDLCALC 117 (H) 06/09/2017   TRIG 127 06/09/2017   CHOLHDL 3.9 06/09/2017    Colon cancer screening:She reported at her physical in January that she saw gastroenterologist last winter. We called over to Dr. Ulyses Amor office, reports last colonoscopy was 2011 (didn't have anything after) +FH colon cancer (dad); polyp on last scope 06/2009--pathology showed tubular adenoma(and was due to f/u in 5 years).  She reports that she owes them money, won't see her back, needs referral elsewhere.  Obesity and pre-diabetes:  Fasting sugar in 05/2017 was 108.  At that time she reported drinking grape juice. +sweets/cookies.  She no longer drinks juice (just occasional orange juice).  She has cut back on her sweets.  PMH, PSH, FH and SH reviewed  Outpatient Encounter Medications as of 12/08/2017  Medication Sig  . aspirin 81 MG tablet Take 81 mg by mouth daily.    Marland Kitchen atenolol (TENORMIN) 25 MG tablet Take 1 tablet (25 mg total) by mouth daily.  . cholecalciferol (VITAMIN D) 1000 UNITS tablet Take 1,000 Units by mouth daily.  . fish oil-omega-3 fatty acids 1000 MG capsule Take 2 g by mouth daily.   Marland Kitchen lisinopril-hydrochlorothiazide (PRINZIDE,ZESTORETIC) 20-12.5 MG tablet Take 1 tablet by mouth  daily.  . pravastatin (PRAVACHOL) 40 MG tablet Take 1 tablet (40 mg total) by mouth daily.  . [DISCONTINUED] atenolol (TENORMIN) 25 MG tablet TAKE 1 TABLET BY MOUTH  DAILY  . [DISCONTINUED] lisinopril-hydrochlorothiazide (PRINZIDE,ZESTORETIC) 20-12.5 MG tablet Take 1 tablet by mouth daily.  . [DISCONTINUED] pravastatin (PRAVACHOL) 40 MG tablet Take 1 tablet by mouth daily  . [DISCONTINUED] pravastatin (PRAVACHOL) 40 MG tablet TAKE 1 TABLET BY MOUTH  DAILY  . [DISCONTINUED] pravastatin (PRAVACHOL) 40 MG tablet Take 1 tablet by mouth daily   No facility-administered encounter medications on file as of 12/08/2017.    No Known Allergies   ROS: no fever, chills, headaches, dizziness, chest pain, palpitations, shortness of breath, URI symptoms, GI or GU complaints. No bleeding, bruising, rash Knee pain--only some days has mild discomfort, tolerable. +weight loss  See HPI   PHYSICAL EXAM:  BP 140/82   Pulse 60   Ht 5' 1.5" (1.562 m)   Wt 182 lb (82.6 kg)   BMI 33.83 kg/m   136/68 on repeat by MD  Wt Readings from Last 3 Encounters:  12/08/17 182 lb (82.6 kg)  06/09/17 189 lb 6.4 oz (85.9 kg)  11/04/16 193 lb (87.5 kg)    Well developed, pleasant female in no distress HEENT: OP--dentures, moist mucus membranes. Conjunctiva and sclera clear Heart: regular rate and rhythm without murmur Lungs: clear bilaterally Back: no spinal or CVA tenderness Abdomen: soft, nontender, no organomegaly or mass Extremities: no edema Neuro: alert and oriented, normal gait Psych: normal mood, affect, hygiene and grooming Skin: no visible rashes/lesions.  Lab Results  Component Value Date   HGBA1C 5.9 (A) 12/08/2017    ASSESSMENT/PLAN:  Essential hypertension, benign - borderline; cont current meds, low sodium diet, exercise, weight loss - Plan: Comprehensive metabolic panel, atenolol (TENORMIN) 25 MG tablet, lisinopril-hydrochlorothiazide (PRINZIDE,ZESTORETIC) 20-12.5 MG tablet  Impaired  fasting glucose - counseled re: diet, exercise, weight loss - Plan: HgB A1c, Comprehensive metabolic panel  Tubular adenoma of colon - past due for colonoscopy; refer to [facility] (unable to f/u at Dr. Ulyses Amor office due to balance owed) - Plan: Ambulatory referral to Gastroenterology  Medication monitoring encounter - Plan: Comprehensive metabolic panel, Lipid panel  Essential hypertension, benign - controlled - Plan: Comprehensive metabolic panel, atenolol (TENORMIN) 25 MG tablet, lisinopril-hydrochlorothiazide (PRINZIDE,ZESTORETIC) 20-12.5 MG tablet  Essential hypertension, benign - well controlled - Plan: Comprehensive metabolic panel, atenolol (TENORMIN) 25 MG tablet, lisinopril-hydrochlorothiazide (PRINZIDE,ZESTORETIC) 20-12.5 MG tablet  Pure hypercholesterolemia - at goal on last check on current regimen.  - Plan: pravastatin (PRAVACHOL) 40 MG tablet  Colon cancer screening - Plan: Ambulatory referral to Gastroenterology  Family history of colon cancer in father - Plan: Ambulatory referral to Gastroenterology  Refer to GI for colonoscopy, past due Reminded about Shingrix, to get at pharmacy when available  Uses [facility] (Got rx's transferred from [facility])     We are referring you to [facility]--you are past due for another colonoscopy; you have had adenomatous colon polyps and have a family history of colon cancer, so it is very important that you get this done. You can check with their office and/or if with your insurance with questions about costs.   Continue regular exercise and working on additional weight loss (losing weight from your waist, cutting down on pant size, is healthier for you).

## 2017-12-08 ENCOUNTER — Ambulatory Visit (INDEPENDENT_AMBULATORY_CARE_PROVIDER_SITE_OTHER): Payer: Medicare Other | Admitting: Family Medicine

## 2017-12-08 ENCOUNTER — Encounter: Payer: Self-pay | Admitting: Family Medicine

## 2017-12-08 VITALS — BP 136/68 | HR 60 | Ht 61.5 in | Wt 182.0 lb

## 2017-12-08 DIAGNOSIS — Z800 Family history of malignant neoplasm of digestive organs: Secondary | ICD-10-CM

## 2017-12-08 DIAGNOSIS — I10 Essential (primary) hypertension: Secondary | ICD-10-CM | POA: Diagnosis not present

## 2017-12-08 DIAGNOSIS — D126 Benign neoplasm of colon, unspecified: Secondary | ICD-10-CM

## 2017-12-08 DIAGNOSIS — R7301 Impaired fasting glucose: Secondary | ICD-10-CM

## 2017-12-08 DIAGNOSIS — Z5181 Encounter for therapeutic drug level monitoring: Secondary | ICD-10-CM

## 2017-12-08 DIAGNOSIS — Z1211 Encounter for screening for malignant neoplasm of colon: Secondary | ICD-10-CM

## 2017-12-08 DIAGNOSIS — E7800 Pure hypercholesterolemia, unspecified: Secondary | ICD-10-CM | POA: Diagnosis not present

## 2017-12-08 LAB — POCT GLYCOSYLATED HEMOGLOBIN (HGB A1C): Hemoglobin A1C: 5.9 % — AB (ref 4.0–5.6)

## 2017-12-08 MED ORDER — ATENOLOL 25 MG PO TABS: 25.0000 mg | ORAL_TABLET | Freq: Every day | ORAL | 1 refills | Status: DC

## 2017-12-08 MED ORDER — LISINOPRIL-HYDROCHLOROTHIAZIDE 20-12.5 MG PO TABS: 1.0000 | ORAL_TABLET | Freq: Every day | ORAL | 1 refills | Status: DC

## 2017-12-08 MED ORDER — PRAVASTATIN SODIUM 40 MG PO TABS: 40.0000 mg | ORAL_TABLET | Freq: Every day | ORAL | 1 refills | Status: DC

## 2017-12-08 NOTE — Patient Instructions (Addendum)
We are referring you to [facility]--you are past due for another colonoscopy; you have had adenomatous colon polyps and have a family history of colon cancer, so it is very important that you get this done. You can check with their office and/or if with your insurance with questions about costs.   Continue regular exercise and working on additional weight loss (losing weight from your waist, cutting down on pant size, is healthier for you).  I recommend getting the new shingles vaccine (Shingrix). You will need to check with your insurance to see if it is covered, and if covered by Medicare Part D, you need to get from the pharmacy rather than our office.  It is a series of 2 injections, spaced 2 months apart.   Prediabetes Prediabetes is the condition of having a blood sugar (blood glucose) level that is higher than it should be, but not high enough for you to be diagnosed with type 2 diabetes. Having prediabetes puts you at risk for developing type 2 diabetes (type 2 diabetes mellitus). Prediabetes may be called impaired glucose tolerance or impaired fasting glucose. Prediabetes usually does not cause symptoms. Your health care provider can diagnose this condition with blood tests. You may be tested for prediabetes if you are overweight and if you have at least one other risk factor for prediabetes. Risk factors for prediabetes include:  Having a family member with type 2 diabetes.  Being overweight or obese.  Being older than age 3.  Being of American-Indian, African-American, Hispanic/Latino, or Asian/Pacific Islander descent.  Having an inactive (sedentary) lifestyle.  Having a history of gestational diabetes or polycystic ovarian syndrome (PCOS).  Having low levels of good cholesterol (HDL-C) or high levels of blood fats (triglycerides).  Having high blood pressure.  What is blood glucose and how is blood glucose measured?  Blood glucose refers to the amount of glucose in  your bloodstream. Glucose comes from eating foods that contain sugars and starches (carbohydrates) that the body breaks down into glucose. Your blood glucose level may be measured in mg/dL (milligrams per deciliter) or mmol/L (millimoles per liter).Your blood glucose may be checked with one or more of the following blood tests:  A fasting blood glucose (FBG) test. You will not be allowed to eat (you will fast) for at least 8 hours before a blood sample is taken. ? A normal range for FBG is 70-100 mg/dl (3.9-5.6 mmol/L).  An A1c (hemoglobin A1c) blood test. This test provides information about blood glucose control over the previous 2?5months.  An oral glucose tolerance test (OGTT). This test measures your blood glucose twice: ? After fasting. This is your baseline level. ? Two hours after you drink a beverage that contains glucose.  You may be diagnosed with prediabetes:  If your FBG is 100?125 mg/dL (5.6-6.9 mmol/L).  If your A1c level is 5.7?6.4%.  If your OGGT result is 140?199 mg/dL (7.8-11 mmol/L).  These blood tests may be repeated to confirm your diagnosis. What happens if blood glucose is too high? The pancreas produces a hormone (insulin) that helps move glucose from the bloodstream into cells. When cells in the body do not respond properly to insulin that the body makes (insulin resistance), excess glucose builds up in the blood instead of going into cells. As a result, high blood glucose (hyperglycemia) can develop, which can cause many complications. This is a symptom of prediabetes. What can happen if blood glucose stays higher than normal for a long time? Having high blood  glucose for a long time is dangerous. Too much glucose in your blood can damage your nerves and blood vessels. Long-term damage can lead to complications from diabetes, which may include:  Heart disease.  Stroke.  Blindness.  Kidney disease.  Depression.  Poor circulation in the feet and legs,  which could lead to surgical removal (amputation) in severe cases.  How can prediabetes be prevented from turning into type 2 diabetes?  To help prevent type 2 diabetes, take the following actions:  Be physically active. ? Do moderate-intensity physical activity for at least 30 minutes on at least 5 days of the week, or as much as told by your health care provider. This could be brisk walking, biking, or water aerobics. ? Ask your health care provider what activities are safe for you. A mix of physical activities may be best, such as walking, swimming, cycling, and strength training.  Lose weight as told by your health care provider. ? Losing 5-7% of your body weight can reverse insulin resistance. ? Your health care provider can determine how much weight loss is best for you and can help you lose weight safely.  Follow a healthy meal plan. This includes eating lean proteins, complex carbohydrates, fresh fruits and vegetables, low-fat dairy products, and healthy fats. ? Follow instructions from your health care provider about eating or drinking restrictions. ? Make an appointment to see a diet and nutrition specialist (registered dietitian) to help you create a healthy eating plan that is right for you.  Do not smoke or use any tobacco products, such as cigarettes, chewing tobacco, and e-cigarettes. If you need help quitting, ask your health care provider.  Take over-the-counter and prescription medicines as told by your health care provider. You may be prescribed medicines that help lower the risk of type 2 diabetes.  This information is not intended to replace advice given to you by your health care provider. Make sure you discuss any questions you have with your health care provider. Document Released: 08/21/2015 Document Revised: 10/05/2015 Document Reviewed: 06/20/2015 Elsevier Interactive Patient Education  [employer].

## 2017-12-09 ENCOUNTER — Telehealth: Payer: Self-pay | Admitting: Internal Medicine

## 2017-12-09 ENCOUNTER — Encounter: Payer: Self-pay | Admitting: Family Medicine

## 2017-12-09 LAB — COMPREHENSIVE METABOLIC PANEL
ALT: 14 IU/L (ref 0–32)
AST: 15 IU/L (ref 0–40)
Albumin/Globulin Ratio: 1.8 (ref 1.2–2.2)
Albumin: 4.2 g/dL (ref 3.5–4.8)
Alkaline Phosphatase: 60 IU/L (ref 39–117)
BUN/Creatinine Ratio: 23 (ref 12–28)
BUN: 22 mg/dL (ref 8–27)
Bilirubin Total: 0.7 mg/dL (ref 0.0–1.2)
CO2: 28 mmol/L (ref 20–29)
Calcium: 9.2 mg/dL (ref 8.7–10.3)
Chloride: 101 mmol/L (ref 96–106)
Creatinine, Ser: 0.95 mg/dL (ref 0.57–1.00)
GFR calc Af Amer: 69 mL/min/{1.73_m2} (ref >59)
GFR calc non Af Amer: 60 mL/min/{1.73_m2} (ref >59)
Globulin, Total: 2.4 g/dL (ref 1.5–4.5)
Glucose: 96 mg/dL (ref 65–99)
Potassium: 3.9 mmol/L (ref 3.5–5.2)
Sodium: 143 mmol/L (ref 134–144)
Total Protein: 6.6 g/dL (ref 6.0–8.5)

## 2017-12-09 LAB — LIPID PANEL
Chol/HDL Ratio: 3.5 ratio (ref 0.0–4.4)
Cholesterol, Total: 177 mg/dL (ref 100–199)
HDL: 51 mg/dL (ref >39)
LDL Calculated: 108 mg/dL — ABNORMAL HIGH (ref 0–99)
Triglycerides: 91 mg/dL (ref 0–149)
VLDL Cholesterol Cal: 18 mg/dL (ref 5–40)

## 2017-12-15 ENCOUNTER — Encounter: Payer: Self-pay | Admitting: Internal Medicine

## 2017-12-15 NOTE — Telephone Encounter (Signed)
Previous Colon report reviewed by Dr. Hilarie Fredrickson and accept.  Patient scheduled for colon on 02-11-18 and previsit for 01-28-18

## 2018-01-28 ENCOUNTER — Ambulatory Visit (AMBULATORY_SURGERY_CENTER): Payer: Self-pay | Admitting: *Deleted

## 2018-01-28 ENCOUNTER — Encounter: Payer: Self-pay | Admitting: Internal Medicine

## 2018-01-28 VITALS — Ht 63.0 in | Wt 183.8 lb

## 2018-01-28 DIAGNOSIS — Z86010 Personal history of colonic polyps: Secondary | ICD-10-CM

## 2018-01-28 MED ORDER — NA SULFATE-K SULFATE-MG SULF 17.5-3.13-1.6 GM/177ML PO SOLN: 1.0000 | Freq: Once | ORAL | 0 refills | Status: AC

## 2018-01-28 NOTE — Progress Notes (Signed)
No egg or soy allergy known to patient  No issues with past sedation with any surgeries  or procedures, no intubation problems  No diet pills per patient No home 02 use per patient  No blood thinners per patient  Pt denies issues with constipation  No A fib or A flutter  EMMI video sent to pt's e mail  

## 2018-02-11 ENCOUNTER — Ambulatory Visit (AMBULATORY_SURGERY_CENTER): Payer: Medicare Other | Admitting: Internal Medicine

## 2018-02-11 ENCOUNTER — Encounter: Payer: Self-pay | Admitting: Internal Medicine

## 2018-02-11 VITALS — BP 118/60 | HR 55 | Temp 97.3°F | Resp 16 | Ht 61.0 in | Wt 189.0 lb

## 2018-02-11 DIAGNOSIS — Z86010 Personal history of colonic polyps: Secondary | ICD-10-CM

## 2018-02-11 DIAGNOSIS — D123 Benign neoplasm of transverse colon: Secondary | ICD-10-CM | POA: Diagnosis not present

## 2018-02-11 DIAGNOSIS — D124 Benign neoplasm of descending colon: Secondary | ICD-10-CM | POA: Diagnosis not present

## 2018-02-11 HISTORY — PX: COLONOSCOPY: SHX174

## 2018-02-11 MED ORDER — SODIUM CHLORIDE 0.9 % IV SOLN: 500.0000 mL | Freq: Once | INTRAVENOUS | Status: DC

## 2018-02-11 NOTE — Op Note (Signed)
[facility] Patient Name: Sylvia Gutierrez Procedure Date: 02/11/2018 11:58 AM MRN: [id number] Endoscopist: Jerene Bears , MD Age: 72 Referring MD:  Date of Birth: 01/02/1946 Gender: Female Account #: [id number] Procedure:                Colonoscopy Indications:              High risk colon cancer surveillance: Personal                            history of non-advanced adenoma, Last colonoscopy:                            2011 Medicines:                Monitored Anesthesia Care Procedure:                Pre-Anesthesia Assessment:                           - Prior to the procedure, a History and Physical                            was performed, and patient medications and                            allergies were reviewed. The patient's tolerance of                            previous anesthesia was also reviewed. The risks                            and benefits of the procedure and the sedation                            options and risks were discussed with the patient.                            All questions were answered, and informed consent                            was obtained. Prior Anticoagulants: The patient has                            taken no previous anticoagulant or antiplatelet                            agents. ASA Grade Assessment: II - A patient with                            mild systemic disease. After reviewing the risks                            and benefits, the patient was deemed in  satisfactory condition to undergo the procedure.                           After obtaining informed consent, the colonoscope                            was passed under direct vision. Throughout the                            procedure, the patient's blood pressure, pulse, and                            oxygen saturations were monitored continuously. The                            Colonoscope was introduced through the anus and                   advanced to the cecum, identified by appendiceal                            orifice and ileocecal valve. The colonoscopy was                            performed without difficulty. The patient tolerated                            the procedure well. The quality of the bowel                            preparation was good. The ileocecal valve,                            appendiceal orifice, and rectum were photographed. Scope In: 12:09:33 PM Scope Out: 12:21:31 PM Scope Withdrawal Time: 0 hours 8 minutes 28 seconds  Total Procedure Duration: 0 hours 11 minutes 58 seconds  Findings:                 The digital rectal exam was normal.                           Two sessile polyps were found in the descending                            colon and transverse colon. The polyps were 4 to 5                            mm in size. These polyps were removed with a cold                            snare. Resection and retrieval were complete.                           Many small and large-mouthed diverticula were found  from sigmoid to ascending colon.                           Internal hemorrhoids were found during                            retroflexion. The hemorrhoids were small. Complications:            No immediate complications. Estimated Blood Loss:     Estimated blood loss was minimal. Impression:               - Two 4 to 5 mm polyps in the descending colon and                            in the transverse colon, removed with a cold snare.                            Resected and retrieved.                           - Severe diverticulosis from sigmoid to ascending                            colon.                           - Small internal hemorrhoids. Recommendation:           - Patient has a contact number available for                            emergencies. The signs and symptoms of potential                            delayed complications were  discussed with the                            patient. Return to normal activities tomorrow.                            Written discharge instructions were provided to the                            patient.                           - Resume previous diet.                           - Continue present medications.                           - Await pathology results.                           - Repeat colonoscopy is recommended for  surveillance. The colonoscopy date will be                            determined after pathology results from today's                            exam become available for review. Jerene Bears, MD 02/11/2018 12:24:33 PM This report has been signed electronically.

## 2018-02-11 NOTE — Progress Notes (Signed)
Called to room to assist during endoscopic procedure.  Patient ID and intended procedure confirmed with present staff. Received instructions for my participation in the procedure from the performing physician.  

## 2018-02-11 NOTE — Patient Instructions (Signed)
   INFORMATION ON POLYPS,DIVERTICULOSIS,& HEMORRHOIDS GIVEN TO YOU TODAY .    AWAIT PATHOLOGY RESULTS ON POLYPS REMOVED TODAY ( IN LETTER FROM DR PYRTLE)    YOU HAD AN ENDOSCOPIC PROCEDURE TODAY AT [facility]:   Refer to the procedure report that was given to you for any specific questions about what was found during the examination.  If the procedure report does not answer your questions, please call your gastroenterologist to clarify.  If you requested that your care partner not be given the details of your procedure findings, then the procedure report has been included in a sealed envelope for you to review at your convenience later.  YOU SHOULD EXPECT: Some feelings of bloating in the abdomen. Passage of more gas than usual.  Walking can help get rid of the air that was put into your GI tract during the procedure and reduce the bloating. If you had a lower endoscopy (such as a colonoscopy or flexible sigmoidoscopy) you may notice spotting of blood in your stool or on the toilet paper. If you underwent a bowel prep for your procedure, you may not have a normal bowel movement for a few days.  Please Note:  You might notice some irritation and congestion in your nose or some drainage.  This is from the oxygen used during your procedure.  There is no need for concern and it should clear up in a day or so.  SYMPTOMS TO REPORT IMMEDIATELY:   Following lower endoscopy (colonoscopy or flexible sigmoidoscopy):  Excessive amounts of blood in the stool  Significant tenderness or worsening of abdominal pains  Swelling of the abdomen that is new, acute  Fever of 100F or higher    For urgent or emergent issues, a gastroenterologist can be reached at any hour by calling [phone].   DIET:  We do recommend a small meal at first, but then you may proceed to your regular diet.  Drink plenty of fluids but you should avoid alcoholic beverages for 24 hours.  ACTIVITY:  You should  plan to take it easy for the rest of today and you should NOT DRIVE or use heavy machinery until tomorrow (because of the sedation medicines used during the test).    FOLLOW UP: Our staff will call the number listed on your records the next business day following your procedure to check on you and address any questions or concerns that you may have regarding the information given to you following your procedure. If we do not reach you, we will leave a message.  However, if you are feeling well and you are not experiencing any problems, there is no need to return our call.  We will assume that you have returned to your regular daily activities without incident.  If any biopsies were taken you will be contacted by phone or by letter within the next 1-3 weeks.  Please call us at [phone] if you have not heard about the biopsies in 3 weeks.    SIGNATURES/CONFIDENTIALITY: You and/or your care partner have signed paperwork which will be entered into your electronic medical record.  These signatures attest to the fact that that the information above on your After Visit Summary has been reviewed and is understood.  Full responsibility of the confidentiality of this discharge information lies with you and/or your care-partner.

## 2018-02-11 NOTE — Progress Notes (Signed)
Pt's states no medical or surgical changes since previsit or office visit. 

## 2018-02-11 NOTE — Progress Notes (Signed)
To recovery, report to RN, VSS. 

## 2018-02-12 ENCOUNTER — Telehealth: Payer: Self-pay

## 2018-02-12 NOTE — Telephone Encounter (Signed)
  Follow up Call-  Call back number 02/11/2018  Post procedure Call Back phone  # [phone]  Permission to leave phone message Yes  Some recent data might be hidden     Patient questions:  Do you have a fever, pain , or abdominal swelling? No. Pain Score  0 *  Have you tolerated food without any problems? Yes.    Have you been able to return to your normal activities? Yes.    Do you have any questions about your discharge instructions: Diet   No. Medications  No. Follow up visit  No.  Do you have questions or concerns about your Care? No.  Actions: * If pain score is 4 or above: No action needed, pain <4.

## 2018-02-19 ENCOUNTER — Encounter: Payer: Self-pay | Admitting: Internal Medicine

## 2018-03-27 DIAGNOSIS — Z1231 Encounter for screening mammogram for malignant neoplasm of breast: Secondary | ICD-10-CM | POA: Diagnosis not present

## 2018-03-27 LAB — HM MAMMOGRAPHY

## 2018-07-22 ENCOUNTER — Ambulatory Visit: Payer: Medicare Other | Admitting: Family Medicine

## 2018-08-27 ENCOUNTER — Other Ambulatory Visit: Payer: Self-pay | Admitting: Family Medicine

## 2018-08-27 DIAGNOSIS — E7800 Pure hypercholesterolemia, unspecified: Secondary | ICD-10-CM

## 2018-08-27 DIAGNOSIS — I10 Essential (primary) hypertension: Secondary | ICD-10-CM

## 2018-10-07 ENCOUNTER — Other Ambulatory Visit: Payer: Self-pay | Admitting: *Deleted

## 2018-10-07 DIAGNOSIS — R7303 Prediabetes: Secondary | ICD-10-CM

## 2018-10-07 DIAGNOSIS — R7301 Impaired fasting glucose: Secondary | ICD-10-CM

## 2018-10-07 DIAGNOSIS — E7800 Pure hypercholesterolemia, unspecified: Secondary | ICD-10-CM

## 2018-10-07 DIAGNOSIS — Z5181 Encounter for therapeutic drug level monitoring: Secondary | ICD-10-CM

## 2018-10-07 DIAGNOSIS — I10 Essential (primary) hypertension: Secondary | ICD-10-CM

## 2018-10-09 ENCOUNTER — Other Ambulatory Visit: Payer: Medicare Other

## 2018-10-09 ENCOUNTER — Other Ambulatory Visit: Payer: Self-pay

## 2018-10-09 DIAGNOSIS — Z5181 Encounter for therapeutic drug level monitoring: Secondary | ICD-10-CM

## 2018-10-09 DIAGNOSIS — I10 Essential (primary) hypertension: Secondary | ICD-10-CM

## 2018-10-09 DIAGNOSIS — E7800 Pure hypercholesterolemia, unspecified: Secondary | ICD-10-CM

## 2018-10-09 DIAGNOSIS — R7303 Prediabetes: Secondary | ICD-10-CM | POA: Diagnosis not present

## 2018-10-09 DIAGNOSIS — R7301 Impaired fasting glucose: Secondary | ICD-10-CM

## 2018-10-09 LAB — LIPID PANEL

## 2018-10-10 LAB — COMPREHENSIVE METABOLIC PANEL
ALT: 13 IU/L (ref 0–32)
AST: 17 IU/L (ref 0–40)
Albumin/Globulin Ratio: 1.8 (ref 1.2–2.2)
Albumin: 4.1 g/dL (ref 3.7–4.7)
Alkaline Phosphatase: 69 IU/L (ref 39–117)
BUN/Creatinine Ratio: 19 (ref 12–28)
BUN: 19 mg/dL (ref 8–27)
Bilirubin Total: 0.5 mg/dL (ref 0.0–1.2)
CO2: 27 mmol/L (ref 20–29)
Calcium: 9.6 mg/dL (ref 8.7–10.3)
Chloride: 100 mmol/L (ref 96–106)
Creatinine, Ser: 0.99 mg/dL (ref 0.57–1.00)
GFR calc Af Amer: 65 mL/min/{1.73_m2} (ref >59)
GFR calc non Af Amer: 56 mL/min/{1.73_m2} — ABNORMAL LOW (ref >59)
Globulin, Total: 2.3 g/dL (ref 1.5–4.5)
Glucose: 98 mg/dL (ref 65–99)
Potassium: 3.9 mmol/L (ref 3.5–5.2)
Sodium: 141 mmol/L (ref 134–144)
Total Protein: 6.4 g/dL (ref 6.0–8.5)

## 2018-10-10 LAB — LIPID PANEL
Chol/HDL Ratio: 3.4 ratio (ref 0.0–4.4)
Cholesterol, Total: 169 mg/dL (ref 100–199)
HDL: 50 mg/dL (ref >39)
LDL Calculated: 106 mg/dL — ABNORMAL HIGH (ref 0–99)
Triglycerides: 67 mg/dL (ref 0–149)
VLDL Cholesterol Cal: 13 mg/dL (ref 5–40)

## 2018-10-10 LAB — HEMOGLOBIN A1C
Est. average glucose Bld gHb Est-mCnc: 120 mg/dL
Hgb A1c MFr Bld: 5.8 % — ABNORMAL HIGH (ref 4.8–5.6)

## 2018-10-11 NOTE — Progress Notes (Signed)
Chief Complaint  Patient presents with  . Medicare Wellness    nonfasting AWV with pelvic. Sees eye doctor. Left arm has been bothering her since Feb/March. Has been having sinus issues for a long tims-when she holds her head down her nose runs, anything she can do about this?    Sylvia Gutierrez is a 73 y.o. female who presents for annual physical exam, Medicare Annual Wellness visit and follow-up on chronic medical conditions.  She has the following concerns:  She is complaining of left arm pain for 3-4 months.  The pain is at the front of her elbow, sometimes travels up and down the arm.  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  She hasn't been on the bus (due to the pandemic, schools closed) since mid-March, and she thinks it is better than it had been.  Never was swollen or red.  Never tried ice, heat or tylenol, wasn't bad enough.  Sinus issues, runny nose when she holds her head down. She noted this last year as well. It is starting to bother her more. She also has some sneezing and itchy eyes. She has not tried any medications for this.  Hypertension follow-up: BP'sarenot checked elsewhere. Denies dizziness, headaches, chest pain. Denies side effects of medications. In the past she reported getting slight intermittent ankle swelling, which got worse throughout the day (R>L); reports she no longer gets any swelling. She tries to follow low sodium diet.  Hyperlipidemia follow-up: Patient is reportedly trying to follow a low-fat, low cholesterol diet. Compliant with medications (pravastatin) and denies medication side effects.She had labs done prior to her visit, see below.   She previously questioned whether or not her statin could be affecting her memory.  MMSE and Mini-Cog were normal last year.  She might put something in the refrigerator  that belongs in the freezer.  She loses her cell phone--may be in her hand, or very near her.  Denies getting lost on her normal routes (just when in unfamiliar areas).  H/o adenomatous colon polyps (tubular adenoma 06/2009).  She had repeat colonoscopy repeated in 02/2018 by Dr. Hilarie Fredrickson. Report showed: - Two sessile polyps were found in the descending colon and transverse colon. The polyps were 4 to 5 mm in size. - Many small and large-mouthed diverticula were found from sigmoid to ascending colon. - Internal hemorrhoids were found during retroflexion. The hemorrhoids were small. Pathology revealed these polyps were tubular adenomas, and repeat is recommended in 5 years.  Obesity and pre-diabetes:  Fasting sugar in 05/2017 was 108.  At that time she reported drinking grape juice. +sweets/cookies. She doesn't drink much juice, limits her sweets.  Bilateral knee OA:  She has seen Dr. Marlou Sa in the past, not since 2018, doing well.  They flare sometimes, but not as bad as in the past.  Immunization History  Administered Date(s) Administered  . Influenza, High Dose Seasonal PF 03/04/2013, 02/14/2014, 02/27/2015, 04/25/2016, 06/09/2017  . PPD Test 09/01/2013  . Pneumococcal Conjugate-13 02/14/2014  . Pneumococcal Polysaccharide-23 11/28/2010  . Td 05/13/2005  . Tdap 06/20/2011  . Zoster 01/10/2010   Didn't end up getting her flu shot this past year (had trouble getting it from the pharmacy). Last Pap smear: 07/04/08, s/p hysterectomy Last mammogram: 03/2018 Last colonoscopy: 02/2018, +polyps, 5 year f/u recommended Last DEXA: 04/2016 normal (lowest was T- 0.7 at  L fem neck) Dentist: goes once yearly, past due (hasn't found new dentist yet). She has 5 teeth left.  Ophtho: yearly. Exercise:altered due to the Pandemic. Currently is walking 20 minutes/day. No weight-bearing exercise (Previously was going to the gym 4x/week (walks, elliptical, bike, 45 minutes), no weights.) Normal vitamin D  level in 2014  Other doctors caring for patient include: GI:Dr. Pyrtle Ophtho: Dr. Katy Fitch Dentist: Dr. Nyoka Cowden (needs to switch, he no longer takes her insurance) Podiatrist: [facility]: Dr. Marlou Sa  Depression screen negative Fall screen negative Functional Statusscreen notable for slight memory issue (discussed above). Mini-Cog screen normal see Epicfor full screens.   End of Life Discussion: Patient does not havea living will and medical power of attorney. Paperwork was givenand full discussion had last year.  Past Medical History:  Diagnosis Date  . Anemia   . Colon polyp    colonoscopy 06/2009, due again 2016  . Diverticulosis   . Hemorrhoids   . Hyperlipidemia   . Hypertension   . Onychomycosis     Past Surgical History:  Procedure Laterality Date  . ABDOMINAL HYSTERECTOMY     for bleeding/benign  . CARPAL TUNNEL RELEASE Right   . COLONOSCOPY    . POLYPECTOMY      Social History   Socioeconomic History  . Marital status: Married    Spouse name: Not on file  . Number of children: 4  . Years of education: Not on file  . Highest education level: Not on file  Occupational History  . Occupation: MACHINE OPERATOR--Retired     Employer: [employer]  Social Needs  . Financial resource strain: Not on file  . Food insecurity:    Worry: Not on file    Inability: Not on file  . Transportation needs:    Medical: Not on file    Non-medical: Not on file  Tobacco Use  . Smoking status: Former Smoker    Last attempt to quit: 05/13/2005    Years since quitting: 13.4  . Smokeless tobacco: Never Used  Substance and Sexual Activity  . Alcohol use: No  . Drug use: No  . Sexual activity: Not Currently  Lifestyle  . Physical activity:    Days per week: Not on file    Minutes per session: Not on file  . Stress: Not on file  Relationships  . Social connections:    Talks on phone: Not on file    Gets together: Not on file    Attends religious  service: Not on file    Active member of club or organization: Not on file    Attends meetings of clubs or organizations: Not on file    Relationship status: Not on file  . Intimate partner violence:    Fear of current or ex partner: Not on file    Emotionally abused: Not on file    Physically abused: Not on file    Forced sexual activity: Not on file  Other Topics Concern  . Not on file  Social History Narrative   Retired 2011, but works part-time as a Lawyer.  Separated from her husband 2011, now divorced. Went back to her maiden name Chastidy Ranker, previously [place] Navarrete) in 2019. Lives alone.  Has 4 children, all in [place].  7 grandchildren, 4 great grandchildren    Family History  Problem Relation Age of Onset  . Hypertension Mother   . Cancer Father        colon (74's)  .  Colon cancer Father   . Cancer Sister        throat (smoker)  . Esophageal cancer Sister   . Hypertension Brother   . Diabetes Brother   . Hypertension Brother   . Hypertension Brother   . Cancer Brother        prostate  . Hypertension Daughter   . Breast cancer Neg Hx   . Heart disease Neg Hx   . Colon polyps Neg Hx   . Rectal cancer Neg Hx   . Stomach cancer Neg Hx     Outpatient Encounter Medications as of 10/12/2018  Medication Sig  . aspirin 81 MG tablet Take 81 mg by mouth daily.    Marland Kitchen atenolol (TENORMIN) 25 MG tablet TAKE 1 TABLET(25 MG) BY MOUTH DAILY  . cholecalciferol (VITAMIN D) 1000 UNITS tablet Take 1,000 Units by mouth daily.  . fish oil-omega-3 fatty acids 1000 MG capsule Take 2 g by mouth daily.   Marland Kitchen lisinopril-hydrochlorothiazide (PRINZIDE,ZESTORETIC) 20-12.5 MG tablet TAKE 1 TABLET BY MOUTH DAILY  . pravastatin (PRAVACHOL) 40 MG tablet TAKE 1 TABLET(40 MG) BY MOUTH DAILY   Facility-Administered Encounter Medications as of 10/12/2018  Medication  . 0.9 %  sodium chloride infusion    No Known Allergies  ROS: The patient denies fever, vision changes, decreased hearing,  ear pain, sore throat, breast concerns, chest pain, palpitations, dizziness, syncope, dyspnea on exertion, nausea, vomiting, diarrhea, constipation, abdominal pain, melena, hematochezia, indigestion/heartburn, hematuria, incontinence, dysuria, vaginal bleeding, discharge, odor or itch, genital lesions, weakness, tremor, suspicious skin lesions, depression, anxiety, abnormal bleeding/bruising, or enlarged lymph nodes.  Knee pain improved, not bothersome Runny nose if she leans forward, along with some sneezing and itchy eyes. Slight memory concern as per HPI, not significantly worsened. Left arm/elbow pain per HPI, improving slowly.    PHYSICAL EXAM:  BP 138/72   Pulse 60   Temp 98.1 F (36.7 C) (Oral)   Ht _0  (1.549 m)   Wt 185 lb 3.2 oz (84 kg)   BMI 34.99 kg/m   130/72 on repeat by MD  Wt Readings from Last 3 Encounters:  02/11/18 189 lb (85.7 kg)  01/28/18 183 lb 12.8 oz (83.4 kg)  12/08/17 182 lb (82.6 kg)   General Appearance:  Alert, cooperative, no distress, appears stated age.  Head:  Normocephalic, without obvious abnormality, atraumatic.  Eyes:  PERRL, conjunctiva/corneas clear, EOM's intact, fundinot well visualized  Ears:  Normal TM's and external ear canals   Nose:  Not examined (wearing mask due to coronavirus pandemic)  Throat:  Not examined (wearing mask due to coronavirus pandemic)  Neck:  Supple, no lymphadenopathy; thyroid: no enlargement/tenderness/nodules; no carotid bruit or JVD   Back:  Spine nontender, no curvature, ROM normal, no CVA tenderness   Lungs:  Clear to auscultation bilaterally without wheezes, rales or ronchi; respirations unlabored   Chest Wall:  No tenderness or deformity   Heart:  Regular rate and rhythm, S1 and S2 normal, no murmur, rub or gallop   Breast Exam:  No tenderness, masses, or nipple discharge or inversion. No axillary lymphadenopathy   Abdomen:  Soft, non-tender, nondistended, normoactive bowel  sounds, no masses, no hepatosplenomegaly   Genitalia:  Normal external genitalia without lesions. BUS and vagina normal; uterus surgically absent. No adnexal masses or tenderness. Pap not performed   Rectal:  Normal tone, no masses or tenderness; guaiac negative stool   Extremities:  No clubbing, cyanosis or edema. FROM, no effusion, warmth, erythema or focal soft  tissue swelling.  No pain with wrist or elbow extension/flexion, or with pronation or supination.  nontender along epicondyles, no focal tenderness or reproducible pain.  Pulses:  2+ and symmetric all extremities   Skin:  Skin color, texture, turgor normal, no rashes or lesions. Non-inflamed sebaceous cyst noted at left mid-back  Lymph nodes:  Cervical, supraclavicular, and axillary nodes normal   Neurologic:  CNII-XII intact, normal strength, sensation and gait; reflexes 2+ and symmetric throughout   Psych: Normal mood, affect, hygiene and grooming  Lab Results  Component Value Date   HGBA1C 5.8 (H) 10/09/2018   Fasting glucose 98    Chemistry      Component Value Date/Time   NA 141 10/09/2018 0958   K 3.9 10/09/2018 0958   CL 100 10/09/2018 0958   CO2 27 10/09/2018 0958   BUN 19 10/09/2018 0958   CREATININE 0.99 10/09/2018 0958   CREATININE 0.99 (H) 10/28/2016 1027      Component Value Date/Time   CALCIUM 9.6 10/09/2018 0958   ALKPHOS 69 10/09/2018 0958   AST 17 10/09/2018 0958   ALT 13 10/09/2018 0958   BILITOT 0.5 10/09/2018 0958     Lab Results  Component Value Date   CHOL 169 10/09/2018   HDL 50 10/09/2018   LDLCALC 106 (H) 10/09/2018   TRIG 67 10/09/2018   CHOLHDL 3.4 10/09/2018     ASSESSMENT/PLAN:   Annual physical exam  Medicare annual wellness visit, subsequent  Pure hypercholesterolemia - reviewed results, goals.  Currently okay, cont pravastatin, low cholesterol diet  Essential hypertension, benign - continue current regimen  Tubular adenoma of colon -  discussed colonoscopy results, need for f/u at 5 years  Pre-diabetes - encouraged limiting sweets, carbs, losing weight, daily exercise  Class 1 obesity due to excess calories with serious comorbidity and body mass index (BMI) of 34.0 to 34.9 in adult - counseled re: risks of obesity, healthy diet, exercise and recommendations for weight loss   F/u 1 year, labs prior CBC, c-met, lipid, A1c, TSH  Full Code, Full Care Discussed paperwork for Living Will and Healthcare POA (still has at home), discussed reasons for completing and discussing with family, and getting [place] copies.    Discussed monthly self breast exams and yearly mammograms; at least 30 minutes of aerobic activity at least 5 days/week and weight-bearing exercise 2x/week; proper sunscreen use reviewed; healthy diet, including goals of calcium and vitamin D intake and alcohol recommendations (less than or equal to 1 drink/day) reviewed; regular seatbelt use; changing batteries in smoke detectors. Immunization recommendations discussed, high dose flu shots are recommended yearly in the Fall (pt plans to return for [place], doesn't want to get from pharmacy).Shingrix recommended (to get from pharmacy). Colonoscopy is UTD, repeat due 02/2023   Medicare Attestation I have personally reviewed: The patient's medical and social history Their use of alcohol, tobacco or illicit drugs Their current medications and supplements The patient's functional ability including ADLs,fall risks, home safety risks, cognitive, and hearing and visual impairment Diet and physical activities Evidence for depression or mood disorders  The patient's weight, height, BMI, and visual acuity have been recorded in the chart.  I have made referrals, counseling, and provided education to the patient based on review of the above and I have provided the patient with a written personalized care plan for preventive services.     Vikki Ports, MD

## 2018-10-11 NOTE — Patient Instructions (Addendum)
HEALTH MAINTENANCE RECOMMENDATIONS:  It is recommended that you get at least 30 minutes of aerobic exercise at least 5 days/week (for weight loss, you may need as much as 60-90 minutes). This can be any activity that gets your heart rate up. This can be divided in 10-15 minute intervals if needed, but try and build up your endurance at least once a week.  Weight bearing exercise is also recommended twice weekly.  Eat a healthy diet with lots of vegetables, fruits and fiber.  "Colorful" foods have a lot of vitamins (ie green vegetables, tomatoes, red peppers, etc).  Limit sweet tea, regular sodas and alcoholic beverages, all of which has a lot of calories and sugar.  Up to 1 alcoholic drink daily may be beneficial for women (unless trying to lose weight, watch sugars).  Drink a lot of water.  Calcium recommendations are 1200-1500 mg daily (1500 mg for postmenopausal women or women without ovaries), and vitamin D 1000 IU daily.  This should be obtained from diet and/or supplements (vitamins), and calcium should not be taken all at once, but in divided doses.  Monthly self breast exams and yearly mammograms for women over the age of 23 is recommended.  Sunscreen of at least SPF 30 should be used on all sun-exposed parts of the skin when outside between the hours of 10 am and 4 pm (not just when at beach or pool, but even with exercise, golf, tennis, and yard work!)  Use a sunscreen that says "broad spectrum" so it covers both UVA and UVB rays, and make sure to reapply every 1-2 hours.  Remember to change the batteries in your smoke detectors when changing your clock times in the spring and fall. Carbon monoxide detectors are recommended for your home.  Use your seat belt every time you are in a car, and please drive safely and not be distracted with cell phones and texting while driving.   Ms. Leiphart , Thank you for taking time to come for your Medicare Wellness Visit. I appreciate your ongoing  commitment to your health goals. Please review the following plan we discussed and let me know if I can assist you in the future.   This is a list of the screening recommended for you and due dates:  Health Maintenance  Topic Date Due  . Flu Shot  Fall of 2020 (sept/Oct)  . Mammogram  03/28/2019  . Tetanus Vaccine  06/19/2021  . Colon Cancer Screening  02/12/2023  . DEXA scan (bone density measurement)  Completed  .  Hepatitis C: One time screening is recommended by Center for Disease Control  (CDC) for  adults born from [birth year] through 1966.   Completed  . Pneumonia vaccines  Completed   High dose flu shots are recommended every Fall (Sept/Oct)  I recommend getting the new shingles vaccine (Shingrix). You will need to get this from the pharmacy, as it is covered by Medicare Part D.  It is a series of 2 injections, spaced 2 months apart.  Try taking loratidine (claritin) once daily to see if this helps with your runny nose. If it doesn't, you can then try Flonase nasal spray.  We discussed keeping routines (parking in the same location every time you're at the store, keeping your keys in the same place) as well as concentrating on what you're doing, rather than being distracted, ie talking on the phone (while putting groceries away).  If you notice more worrisome changes in your memory, please let us  know.  Try ice (right after work) vs heat if your elbow/arm is hurting.  You can also take tylenol if needed, if the pain is more severe.  Please fill out the Living Will and Healthcare power of attorney paperwork that we gave you last year.  Once it is notarized we would like a copy, to be scanned into your chart.   MyPlate from [place] is an outline of a general healthy diet based on the 2010 Dietary Guidelines for Americans, from the U.S. Department of Agriculture Scientist, research (physical sciences)). It sets guidelines for how much food you should eat from each food group based on your age, sex, and level of  physical activity. What are tips for following MyPlate? To follow MyPlate recommendations:  Eat a wide variety of fruits and vegetables, grains, and protein foods.  Serve smaller portions and eat less food throughout the day.  Limit portion sizes to avoid overeating.  Enjoy your food.  Get at least 150 minutes of exercise every week. This is about 30 minutes each day, 5 or more days per week. It can be difficult to have every meal look like MyPlate. Think about MyPlate as eating guidelines for an entire day, rather than each individual meal. Fruits and vegetables  Make half of your plate fruits and vegetables.  Eat many different colors of fruits and vegetables each day.  For a 2,000 calorie daily food plan, eat: ? 2 cups of vegetables every day. ? 2 cups of fruit every day.  1 cup is equal to: ? 1 cup raw or cooked vegetables. ? 1 cup raw fruit. ? 1 medium-sized orange, apple, or banana. ? 1 cup 100% fruit or vegetable juice. ? 2 cups raw leafy greens, such as lettuce, spinach, or kale. ?  cup dried fruit. Grains  One fourth of your plate should be grains.  Make at least half of the grains you eat each day whole grains.  For a 2,000 calorie daily food plan, eat 6 oz of grains every day.  1 oz is equal to: ? 1 slice bread. ? 1 cup cereal. ?  cup cooked rice, cereal, or pasta. Protein  One fourth of your plate should be protein.  Eat a wide variety of protein foods, including meat, poultry, fish, eggs, beans, nuts, and tofu.  For a 2,000 calorie daily food plan, eat 5 oz of protein every day.  1 oz is equal to: ? 1 oz meat, poultry, or fish. ?  cup cooked beans. ? 1 egg. ?  oz nuts or seeds. ? 1 Tbsp peanut butter. Dairy  Drink fat-free or low-fat (1%) milk.  Eat or drink dairy as a side to meals.  For a 2,000 calorie daily food plan, eat or drink 3 cups of dairy every day.  1 cup is equal to: ? 1 cup milk, yogurt, cottage cheese, or soy milk  (soy beverage). ? 2 oz processed cheese. ? 1 oz natural cheese. Fats, oils, salt, and sugars  Only small amounts of oils are recommended.  Avoid foods that are high in calories and low in nutritional value (empty calories), like foods high in fat or added sugars.  Choose foods that are low in salt (sodium). Choose foods that have less than 140 milligrams (mg) of sodium per serving.  Drink water instead of sugary drinks. Drink enough water each day to keep your urine pale yellow. Where to find support  Work with your health care provider or a nutrition specialist (dietitian) to  develop a customized eating plan that is right for you.  Download an app (mobile application) to help you track your daily food intake. Where to find more information  Go to [email or web address] for more information.  Learn more and log your daily food intake according to MyPlate using USDA's SuperTracker: www.supertracker.usda.gov Summary  MyPlate is a general guideline for healthy eating from the USDA. It is based on the 2010 Dietary Guidelines for Americans.  In general, fruits and vegetables should take up  of your plate, grains should take up  of your plate, and protein should take up  of your plate. This information is not intended to replace advice given to you by your health care provider. Make sure you discuss any questions you have with your health care provider. Document Released: 05/19/2007 Document Revised: 07/29/2016 Document Reviewed: 07/29/2016 Elsevier Interactive Patient Education  2019 [employer].

## 2018-10-12 ENCOUNTER — Ambulatory Visit (INDEPENDENT_AMBULATORY_CARE_PROVIDER_SITE_OTHER): Payer: Medicare Other | Admitting: Family Medicine

## 2018-10-12 ENCOUNTER — Other Ambulatory Visit: Payer: Self-pay

## 2018-10-12 ENCOUNTER — Encounter: Payer: Self-pay | Admitting: Family Medicine

## 2018-10-12 VITALS — BP 130/72 | HR 60 | Temp 98.1°F | Ht 61.0 in | Wt 185.2 lb

## 2018-10-12 DIAGNOSIS — I10 Essential (primary) hypertension: Secondary | ICD-10-CM | POA: Diagnosis not present

## 2018-10-12 DIAGNOSIS — E7800 Pure hypercholesterolemia, unspecified: Secondary | ICD-10-CM | POA: Diagnosis not present

## 2018-10-12 DIAGNOSIS — Z0000 Encounter for general adult medical examination without abnormal findings: Secondary | ICD-10-CM

## 2018-10-12 DIAGNOSIS — E6609 Other obesity due to excess calories: Secondary | ICD-10-CM

## 2018-10-12 DIAGNOSIS — D126 Benign neoplasm of colon, unspecified: Secondary | ICD-10-CM | POA: Diagnosis not present

## 2018-10-12 DIAGNOSIS — Z6834 Body mass index (BMI) 34.0-34.9, adult: Secondary | ICD-10-CM

## 2018-10-12 DIAGNOSIS — R413 Other amnesia: Secondary | ICD-10-CM

## 2018-10-12 DIAGNOSIS — Z5181 Encounter for therapeutic drug level monitoring: Secondary | ICD-10-CM

## 2018-10-12 DIAGNOSIS — R7303 Prediabetes: Secondary | ICD-10-CM | POA: Diagnosis not present

## 2018-11-24 ENCOUNTER — Other Ambulatory Visit: Payer: Self-pay | Admitting: Family Medicine

## 2018-11-24 DIAGNOSIS — E7800 Pure hypercholesterolemia, unspecified: Secondary | ICD-10-CM

## 2018-11-24 DIAGNOSIS — I10 Essential (primary) hypertension: Secondary | ICD-10-CM

## 2018-12-04 ENCOUNTER — Other Ambulatory Visit: Payer: Self-pay

## 2018-12-04 DIAGNOSIS — Z20822 Contact with and (suspected) exposure to covid-19: Secondary | ICD-10-CM

## 2018-12-06 LAB — NOVEL CORONAVIRUS, NAA: SARS-CoV-2, NAA: NOT DETECTED

## 2018-12-11 ENCOUNTER — Telehealth: Payer: Self-pay | Admitting: General Practice

## 2018-12-11 NOTE — Telephone Encounter (Signed)
Pt called in for her covid results. Advised pt of Not Detected result.

## 2019-01-07 ENCOUNTER — Other Ambulatory Visit: Payer: Self-pay

## 2019-01-07 ENCOUNTER — Other Ambulatory Visit (INDEPENDENT_AMBULATORY_CARE_PROVIDER_SITE_OTHER): Payer: Medicare Other

## 2019-01-07 DIAGNOSIS — Z23 Encounter for immunization: Secondary | ICD-10-CM

## 2019-02-25 ENCOUNTER — Other Ambulatory Visit: Payer: Self-pay | Admitting: Family Medicine

## 2019-02-25 DIAGNOSIS — I10 Essential (primary) hypertension: Secondary | ICD-10-CM

## 2019-02-25 DIAGNOSIS — E7800 Pure hypercholesterolemia, unspecified: Secondary | ICD-10-CM

## 2019-04-16 ENCOUNTER — Other Ambulatory Visit: Payer: Self-pay

## 2019-04-16 DIAGNOSIS — Z20822 Contact with and (suspected) exposure to covid-19: Secondary | ICD-10-CM

## 2019-04-17 ENCOUNTER — Telehealth: Payer: Self-pay | Admitting: *Deleted

## 2019-04-17 NOTE — Telephone Encounter (Signed)
Calling for [place] results. None available as of yet.

## 2019-04-18 LAB — NOVEL CORONAVIRUS, NAA: SARS-CoV-2, NAA: NOT DETECTED

## 2019-04-22 ENCOUNTER — Encounter: Payer: Self-pay | Admitting: Family Medicine

## 2019-04-22 ENCOUNTER — Ambulatory Visit (INDEPENDENT_AMBULATORY_CARE_PROVIDER_SITE_OTHER): Payer: Medicare Other | Admitting: Family Medicine

## 2019-04-22 ENCOUNTER — Other Ambulatory Visit: Payer: Self-pay

## 2019-04-22 VITALS — Ht 61.0 in | Wt 180.0 lb

## 2019-04-22 DIAGNOSIS — Z20828 Contact with and (suspected) exposure to other viral communicable diseases: Secondary | ICD-10-CM | POA: Diagnosis not present

## 2019-04-22 DIAGNOSIS — R197 Diarrhea, unspecified: Secondary | ICD-10-CM | POA: Diagnosis not present

## 2019-04-22 DIAGNOSIS — Z20822 Contact with and (suspected) exposure to covid-19: Secondary | ICD-10-CM

## 2019-04-22 NOTE — Progress Notes (Signed)
Start time: 12:00 End time: 12:22  Virtual Visit via Telephone Note  I connected with Sylvia Gutierrez on 04/22/19 at 11:45 AM EST by telephone and verified that I am speaking with the correct person using two identifiers.  Location: Patient: home Provider: office   I discussed the limitations, risks, security and privacy concerns of performing an evaluation and management service by telephone and the availability of in person appointments. I also discussed with the patient that there may be a patient responsible charge related to this service. The patient expressed understanding and agreed to proceed.   History of Present Illness:  Chief Complaint  Patient presents with  . Diarrhea    PHONE CALL VISIT and vomiting Tuesday night. Has chills and body aches that come and go. No coughing but does have a runny nose. Was around someone COVID+ last Tuesday. (she did have a mask on but took it off)   Took soup to her daughter's house last Tuesday. She took mask off while playing with grandkids.  The grandkids tested positive for COVID last week. They had cold symptoms while she was there.  She had a COVID test done Friday, which was negative.  She was asymptomatic at that time.  The following day (Saturday) she lost her appetite, then developed diarrhea, and feels weak and tired.  She gets the diarrhea after eating (any food).  Denies any abdominal pain.  Stools are soft and loose. Denies blood in the stool. Started vomiting 2 days ago, about 3 times only.  No vomiting yesterday or today, but the diarrhea with eating persists. Drinking fluids also gives her diarrhea.  She hasn't had fever, just some chills off/on.  Today--she feels weak and tired.  Denies feeling dizzy or lightheaded. Mouth is dry, urine is yellow, somewhat dark/bright.  PMH, PSH, SH reviewed  Outpatient Encounter Medications as of 04/22/2019  Medication Sig  . aspirin 81 MG tablet Take 81 mg by mouth daily.    Marland Kitchen atenolol  (TENORMIN) 25 MG tablet TAKE 1 TABLET(25 MG) BY MOUTH DAILY  . cholecalciferol (VITAMIN D) 1000 UNITS tablet Take 1,000 Units by mouth daily.  . fish oil-omega-3 fatty acids 1000 MG capsule Take 2 g by mouth daily.   Marland Kitchen lisinopril-hydrochlorothiazide (ZESTORETIC) 20-12.5 MG tablet TAKE 1 TABLET BY MOUTH DAILY  . pravastatin (PRAVACHOL) 40 MG tablet TAKE 1 TABLET(40 MG) BY MOUTH DAILY   Facility-Administered Encounter Medications as of 04/22/2019  Medication  . 0.9 %  sodium chloride infusion   No Known Allergies  ROS:  No fever, some intermittent chills.  +nausea/vomiting (resolved) and diarrhea per HPI.  No dysuria. Still decreased appetite--wants to eat, but doesn't want to because of the diarrhea.  Can't eat much. Denies dizziness, just feels weak, tired. Some runny nose (chronic sinus issues, unchanged) No cough, shortness of breath, chest pain. Denies loss of smell/taste.   Denies myalgias.  No recent antibiotics    Observations/Objective:  Ht 5\' 1"  (1.549 m)   Wt 180 lb (81.6 kg)   BMI 34.01 kg/m   Patient is alert, oriented, and in no distress. Speech is normal. Exam is limited due to telephonic nature of the visit.   Assessment and Plan:  Close exposure to COVID-19 virus - unmasked exposure 9 days ago to her sick grandchildren  Suspected COVID-19 virus infection - negative test was likely done too early (3 days after exposure, still asymptomatic); declines repeat testing, prefers to isolate as if positive  Diarrhea, unspecified type - counseled  re: BRAT diet, avoiding dairy, use of imodium prn, and s/sx dehydration and how to properly hydrate   Follow Up Instructions:  Reviewed s/sx for which she should seek immediate care. Advised we will mail her AVS with additional info and what we discussed.   I discussed the assessment and treatment plan with the patient. The patient was provided an opportunity to ask questions and all were answered. The patient agreed  with the plan and demonstrated an understanding of the instructions.   The patient was advised to call back or seek an in-person evaluation if the symptoms worsen or if the condition fails to improve as anticipated.  I provided 22 minutes of non-face-to-face time during this encounter.   Vikki Ports, MD

## 2019-04-22 NOTE — Patient Instructions (Addendum)
Today we discussed that given your exposure to your grandkids, who were symptomatic and tested positive for COVID, that even though you tested negative, I suspect you have COVID.  You were tested a little too early, and before you developed symptoms.  We discussed having you get re-tested to confirm this, versus isolating for 10 days from the start of your symptoms, treating you as if you had COVID (see information below).  You elected to isolate, so please read the information about COVID below.  We discussed that you are feeling weak related to some probably mild dehydration, related to vomiting and diarrhea. You can use Imodium to help slow down the diarrhea, so that you can hydrate by drinking more fluids.  You can try drinking slower, so that drinking water doesn't trigger more diarrhea.  We discussed eating a bland diet, and avoiding dairy products (milk, cheese), which can worsen diarrhea.  We discussed signs and symptoms of dehydration (worsening weakness, diarrhea, dry mouth, feeling more tired, very dark and small amounts of urine)--if these occur, you may need to go to the hospital for IV fluids.  I think as long as you can drink lots of fluid (and slow down the diarrhea, with medications, if needed), you should be okay.   We also discussed symptoms related to COVID for which you should go to the emergency room (see information below).  Please feel free to reach out to [place] if symptoms change, or if you have any questions.  I hope you feel better soon!!   Food Choices to Help Relieve Diarrhea, Adult When you have diarrhea, the foods you eat and your eating habits are very important. Choosing the right foods and drinks can help:  Relieve diarrhea.  Replace lost fluids and nutrients.  Prevent dehydration. What general guidelines should I follow?  Relieving diarrhea  Choose foods with less than 2 g or .07 oz. of fiber per serving.  Limit fats to less than 8 tsp (38 g or 1.34 oz.) a  day.  Avoid the following: ? Foods and beverages sweetened with high-fructose corn syrup, honey, or sugar alcohols such as xylitol, sorbitol, and mannitol. ? Foods that contain a lot of fat or sugar. ? Fried, greasy, or spicy foods. ? High-fiber grains, breads, and cereals. ? Raw fruits and vegetables.  Eat foods that are rich in probiotics. These foods include dairy products such as yogurt and fermented milk products. They help increase healthy bacteria in the stomach and intestines (gastrointestinal tract, or GI tract).  If you have lactose intolerance, avoid dairy products. These may make your diarrhea worse.  Take medicine to help stop diarrhea (antidiarrheal medicine) only as told by your health care provider. Replacing nutrients  Eat small meals or snacks every 3-4 hours.  Eat bland foods, such as white rice, toast, or baked potato, until your diarrhea starts to get better. Gradually reintroduce nutrient-rich foods as tolerated or as told by your health care provider. This includes: ? Well-cooked protein foods. ? Peeled, seeded, and soft-cooked fruits and vegetables. ? Low-fat dairy products.  Take vitamin and mineral supplements as told by your health care provider. Preventing dehydration  Start by sipping water or a special solution to prevent dehydration (oral rehydration solution, ORS). Urine that is clear or pale yellow means that you are getting enough fluid.  Try to drink at least 8-10 cups of fluid each day to help replace lost fluids.  You may add other liquids in addition to water, such as clear  juice or decaffeinated sports drinks, as tolerated or as told by your health care provider.  Avoid drinks with caffeine, such as coffee, tea, or soft drinks.  Avoid alcohol. What foods are recommended?     The items listed may not be a complete list. Talk with your health care provider about what dietary choices are best for you. Grains White rice. White, [place], or  pita breads (fresh or toasted), including plain rolls, buns, or bagels. White pasta. Saltine, soda, or graham crackers. Pretzels. Low-fiber cereal. Cooked cereals made with water (such as cornmeal, farina, or cream cereals). Plain muffins. Matzo. Melba toast. Zwieback. Vegetables Potatoes (without the skin). Most well-cooked and canned vegetables without skins or seeds. Tender lettuce. Fruits Apple sauce. Fruits canned in juice. Cooked apricots, cherries, grapefruit, peaches, pears, or plums. Fresh bananas and cantaloupe. Meats and other protein foods Baked or boiled chicken. Eggs. Tofu. Fish. Seafood. Smooth nut butters. Ground or well-cooked tender beef, ham, veal, lamb, pork, or poultry. Dairy Plain yogurt, kefir, and unsweetened liquid yogurt. Lactose-free milk, buttermilk, skim milk, or soy milk. Low-fat or nonfat hard cheese. Beverages Water. Low-calorie sports drinks. Fruit juices without pulp. Strained tomato and vegetable juices. Decaffeinated teas. Sugar-free beverages not sweetened with sugar alcohols. Oral rehydration solutions, if approved by your health care provider. Seasoning and other foods Bouillon, broth, or soups made from recommended foods. What foods are not recommended? The items listed may not be a complete list. Talk with your health care provider about what dietary choices are best for you. Grains Whole grain, whole wheat, bran, or rye breads, rolls, pastas, and crackers. Wild or brown rice. Whole grain or bran cereals. Barley. Oats and oatmeal. Corn tortillas or taco shells. Granola. Popcorn. Vegetables Raw vegetables. Fried vegetables. Cabbage, broccoli, Brussels sprouts, artichokes, baked beans, beet greens, corn, kale, legumes, peas, sweet potatoes, and yams. Potato skins. Cooked spinach and cabbage. Fruits Dried fruit, including raisins and dates. Raw fruits. Stewed or dried prunes. Canned fruits with syrup. Meat and other protein foods Fried or fatty meats. Deli  meats. Chunky nut butters. Nuts and seeds. Beans and lentils. Berniece Salines. Hot dogs. Sausage. Dairy High-fat cheeses. Whole milk, chocolate milk, and beverages made with milk, such as milk shakes. Half-and-half. Cream. sour cream. Ice cream. Beverages Caffeinated beverages (such as coffee, tea, soda, or energy drinks). Alcoholic beverages. Fruit juices with pulp. Prune juice. Soft drinks sweetened with high-fructose corn syrup or sugar alcohols. High-calorie sports drinks. Fats and oils Butter. Cream sauces. Margarine. Salad oils. Plain salad dressings. Olives. Avocados. Mayonnaise. Sweets and desserts Sweet rolls, doughnuts, and sweet breads. Sugar-free desserts sweetened with sugar alcohols such as xylitol and sorbitol. Seasoning and other foods Honey. Hot sauce. Chili powder. Gravy. Cream-based or milk-based soups. Pancakes and waffles. Summary  When you have diarrhea, the foods you eat and your eating habits are very important.  Make sure you get at least 8-10 cups of fluid each day, or enough to keep your urine clear or pale yellow.  Eat bland foods and gradually reintroduce healthy, nutrient-rich foods as tolerated, or as told by your health care provider.  Avoid high-fiber, fried, greasy, or spicy foods. This information is not intended to replace advice given to you by your health care provider. Make sure you discuss any questions you have with your health care provider. Document Released: 07/20/2003 Document Revised: 08/20/2018 Document Reviewed: 04/26/2016 Elsevier Patient Education  2020 [employer].  This information is directly available on the CDC website: RunningShows.co.za.html    Source:CDC  Reference to specific commercial products, manufacturers, companies, or trademarks does not constitute its endorsement or recommendation by the [place], [place], or Centers for [employer]  and Prevention.   COVID-19 COVID-19 is a respiratory infection that is caused by a virus called severe acute respiratory syndrome coronavirus 2 (SARS-CoV-2). The disease is also known as coronavirus disease or novel coronavirus. In some people, the virus may not cause any symptoms. In others, it may cause a serious infection. The infection can get worse quickly and can lead to complications, such as:  Pneumonia, or infection of the lungs.  Acute respiratory distress syndrome or ARDS. This is fluid build-up in the lungs.  Acute respiratory failure. This is a condition in which there is not enough oxygen passing from the lungs to the body.  Sepsis or septic shock. This is a serious bodily reaction to an infection.  Blood clotting problems.  Secondary infections due to bacteria or fungus. The virus that causes COVID-19 is contagious. This means that it can spread from person to person through droplets from coughs and sneezes (respiratory secretions). What are the causes? This illness is caused by a virus. You may catch the virus by:  Breathing in droplets from an infected person's cough or sneeze.  Touching something, like a table or a doorknob, that was exposed to the virus (contaminated) and then touching your mouth, nose, or eyes. What increases the risk? Risk for infection You are more likely to be infected with this virus if you:  Live in or travel to an area with a COVID-19 outbreak.  Come in contact with a sick person who recently traveled to an area with a COVID-19 outbreak.  Provide care for or live with a person who is infected with COVID-19. Risk for serious illness You are more likely to become seriously ill from the virus if you:  Are 63 years of age or older.  Have a long-term disease that lowers your body's ability to fight infection (immunocompromised).  Live in a nursing home or long-term care facility.  Have a long-term (chronic) disease such as: ? Chronic  lung disease, including chronic obstructive pulmonary disease or asthma ? Heart disease. ? Diabetes. ? Chronic kidney disease. ? Liver disease.  Are obese. What are the signs or symptoms? Symptoms of this condition can range from mild to severe. Symptoms may appear any time from 2 to 14 days after being exposed to the virus. They include:  A fever.  A cough.  Difficulty breathing.  Chills.  Muscle pains.  A sore throat.  Loss of taste or smell. Some people may also have stomach problems, such as nausea, vomiting, or diarrhea. Other people may not have any symptoms of COVID-19. How is this diagnosed? This condition may be diagnosed based on:  Your signs and symptoms, especially if: ? You live in an area with a COVID-19 outbreak. ? You recently traveled to or from an area where the virus is common. ? You provide care for or live with a person who was diagnosed with COVID-19.  A physical exam.  Lab tests, which may include: ? A nasal swab to take a sample of fluid from your nose. ? A throat swab to take a sample of fluid from your throat. ? A sample of mucus from your lungs (sputum). ? Blood tests.  Imaging tests, which may include, X-rays, CT scan, or ultrasound. How is this treated? At present, there is no medicine to treat COVID-19.  Medicines that treat other diseases are being used on a trial basis to see if they are effective against COVID-19. Your health care provider will talk with you about ways to treat your symptoms. For most people, the infection is mild and can be managed at home with rest, fluids, and over-the-counter medicines. Treatment for a serious infection usually takes places in a hospital intensive care unit (ICU). It may include one or more of the following treatments. These treatments are given until your symptoms improve.  Receiving fluids and medicines through an IV.  Supplemental oxygen. Extra oxygen is given through a tube in the nose, a face  mask, or a hood.  Positioning you to lie on your stomach (prone position). This makes it easier for oxygen to get into the lungs.  Continuous positive airway pressure (CPAP) or bi-level positive airway pressure (BPAP) machine. This treatment uses mild air pressure to keep the airways open. A tube that is connected to a motor delivers oxygen to the body.  Ventilator. This treatment moves air into and out of the lungs by using a tube that is placed in your windpipe.  Tracheostomy. This is a procedure to create a hole in the neck so that a breathing tube can be inserted.  Extracorporeal membrane oxygenation (ECMO). This procedure gives the lungs a chance to recover by taking over the functions of the heart and lungs. It supplies oxygen to the body and removes carbon dioxide. Follow these instructions at home: Lifestyle  If you are sick, stay home except to get medical care. Your health care provider will tell you how long to stay home. Call your health care provider before you go for medical care.  Rest at home as told by your health care provider.  Do not use any products that contain nicotine or tobacco, such as cigarettes, e-cigarettes, and chewing tobacco. If you need help quitting, ask your health care provider.  Return to your normal activities as told by your health care provider. Ask your health care provider what activities are safe for you. General instructions  Take over-the-counter and prescription medicines only as told by your health care provider.  Drink enough fluid to keep your urine pale yellow.  Keep all follow-up visits as told by your health care provider. This is important. How is this prevented?  There is no vaccine to help prevent COVID-19 infection. However, there are steps you can take to protect yourself and others from this virus. To protect yourself:   Do not travel to areas where COVID-19 is a risk. The areas where COVID-19 is reported change often. To  identify high-risk areas and travel restrictions, check the CDC travel website: FatFares.com.br  If you live in, or must travel to, an area where COVID-19 is a risk, take precautions to avoid infection. ? Stay away from people who are sick. ? Wash your hands often with soap and water for 20 seconds. If soap and water are not available, use an alcohol-based hand sanitizer. ? Avoid touching your mouth, face, eyes, or nose. ? Avoid going out in public, follow guidance from your state and local health authorities. ? If you must go out in public, wear a cloth face covering or face mask. ? Disinfect objects and surfaces that are frequently touched every day. This may include:  Counters and tables.  Doorknobs and light switches.  Sinks and faucets.  Electronics, such as phones, remote controls, keyboards, computers, and tablets. To protect others: If you have symptoms of COVID-19,  take steps to prevent the virus from spreading to others.  If you think you have a COVID-19 infection, contact your health care provider right away. Tell your health care team that you think you may have a COVID-19 infection.  Stay home. Leave your house only to seek medical care. Do not use public transport.  Do not travel while you are sick.  Wash your hands often with soap and water for 20 seconds. If soap and water are not available, use alcohol-based hand sanitizer.  Stay away from other members of your household. Let healthy household members care for children and pets, if possible. If you have to care for children or pets, wash your hands often and wear a mask. If possible, stay in your own room, separate from others. Use a different bathroom.  Make sure that all people in your household wash their hands well and often.  Cough or sneeze into a tissue or your sleeve or elbow. Do not cough or sneeze into your hand or into the air.  Wear a cloth face covering or face mask. Where to find more  information  Centers for Disease Control and Prevention: PurpleGadgets.be  World Health Organization: https://www.castaneda.info/ Contact a health care provider if:  You live in or have traveled to an area where COVID-19 is a risk and you have symptoms of the infection.  You have had contact with someone who has COVID-19 and you have symptoms of the infection. Get help right away if:  You have trouble breathing.  You have pain or pressure in your chest.  You have confusion.  You have bluish lips and fingernails.  You have difficulty waking from sleep.  You have symptoms that get worse. These symptoms may represent a serious problem that is an emergency. Do not wait to see if the symptoms will go away. Get medical help right away. Call your local emergency services (911 in the [place] Do not drive yourself to the hospital. Let the emergency medical personnel know if you think you have COVID-19. Summary  COVID-19 is a respiratory infection that is caused by a virus. It is also known as coronavirus disease or novel coronavirus. It can cause serious infections, such as pneumonia, acute respiratory distress syndrome, acute respiratory failure, or sepsis.  The virus that causes COVID-19 is contagious. This means that it can spread from person to person through droplets from coughs and sneezes.  You are more likely to develop a serious illness if you are 20 years of age or older, have a weak immunity, live in a nursing home, or have chronic disease.  There is no medicine to treat COVID-19. Your health care provider will talk with you about ways to treat your symptoms.  Take steps to protect yourself and others from infection. Wash your hands often and disinfect objects and surfaces that are frequently touched every day. Stay away from people who are sick and wear a mask if you are sick. This information is not intended to replace advice given to you by  your health care provider. Make sure you discuss any questions you have with your health care provider. Document Released: 06/04/2018 Document Revised: 09/24/2018 Document Reviewed: 06/04/2018 Elsevier Patient Education  2020 [employer].

## 2019-04-22 NOTE — Progress Notes (Signed)
Done

## 2019-04-25 ENCOUNTER — Emergency Department (HOSPITAL_COMMUNITY): Payer: Medicare Other

## 2019-04-25 ENCOUNTER — Encounter (HOSPITAL_COMMUNITY): Payer: Self-pay

## 2019-04-25 ENCOUNTER — Other Ambulatory Visit: Payer: Self-pay

## 2019-04-25 ENCOUNTER — Emergency Department (HOSPITAL_COMMUNITY)
Admission: EM | Admit: 2019-04-25 | Discharge: 2019-04-25 | Disposition: A | Discharge status: Home / Self Care | Payer: Medicare Other | Attending: Emergency Medicine | Admitting: Emergency Medicine

## 2019-04-25 DIAGNOSIS — Z79899 Other long term (current) drug therapy: Secondary | ICD-10-CM | POA: Diagnosis not present

## 2019-04-25 DIAGNOSIS — Z87891 Personal history of nicotine dependence: Secondary | ICD-10-CM | POA: Insufficient documentation

## 2019-04-25 DIAGNOSIS — N289 Disorder of kidney and ureter, unspecified: Secondary | ICD-10-CM | POA: Diagnosis not present

## 2019-04-25 DIAGNOSIS — R6881 Early satiety: Secondary | ICD-10-CM | POA: Diagnosis not present

## 2019-04-25 DIAGNOSIS — E876 Hypokalemia: Secondary | ICD-10-CM | POA: Insufficient documentation

## 2019-04-25 DIAGNOSIS — E860 Dehydration: Secondary | ICD-10-CM | POA: Diagnosis not present

## 2019-04-25 DIAGNOSIS — Z7982 Long term (current) use of aspirin: Secondary | ICD-10-CM | POA: Diagnosis not present

## 2019-04-25 DIAGNOSIS — I10 Essential (primary) hypertension: Secondary | ICD-10-CM | POA: Diagnosis not present

## 2019-04-25 DIAGNOSIS — K5730 Diverticulosis of large intestine without perforation or abscess without bleeding: Secondary | ICD-10-CM | POA: Diagnosis not present

## 2019-04-25 DIAGNOSIS — I444 Left anterior fascicular block: Secondary | ICD-10-CM | POA: Diagnosis not present

## 2019-04-25 LAB — CBC WITH DIFFERENTIAL/PLATELET
Abs Immature Granulocytes: 0.04 10*3/uL (ref 0.00–0.07)
Basophils Absolute: 0 10*3/uL (ref 0.0–0.1)
Basophils Relative: 0 %
Eosinophils Absolute: 0 10*3/uL (ref 0.0–0.5)
Eosinophils Relative: 0 %
HCT: 42.1 % (ref 36.0–46.0)
Hemoglobin: 13.7 g/dL (ref 12.0–15.0)
Immature Granulocytes: 1 %
Lymphocytes Relative: 14 %
Lymphs Abs: 1 10*3/uL (ref 0.7–4.0)
MCH: 28.3 pg (ref 26.0–34.0)
MCHC: 32.5 g/dL (ref 30.0–36.0)
MCV: 87 fL (ref 80.0–100.0)
Monocytes Absolute: 0.4 10*3/uL (ref 0.1–1.0)
Monocytes Relative: 5 %
Neutro Abs: 5.5 10*3/uL (ref 1.7–7.7)
Neutrophils Relative %: 80 %
Platelets: 245 10*3/uL (ref 150–400)
RBC: 4.84 MIL/uL (ref 3.87–5.11)
RDW: 14 % (ref 11.5–15.5)
WBC: 6.9 10*3/uL (ref 4.0–10.5)
nRBC: 0 % (ref 0.0–0.2)

## 2019-04-25 LAB — COMPREHENSIVE METABOLIC PANEL
ALT: 17 U/L (ref 0–44)
AST: 32 U/L (ref 15–41)
Albumin: 3.5 g/dL (ref 3.5–5.0)
Alkaline Phosphatase: 49 U/L (ref 38–126)
Anion gap: 15 (ref 5–15)
BUN: 50 mg/dL — ABNORMAL HIGH (ref 8–23)
CO2: 28 mmol/L (ref 22–32)
Calcium: 8.5 mg/dL — ABNORMAL LOW (ref 8.9–10.3)
Chloride: 92 mmol/L — ABNORMAL LOW (ref 98–111)
Creatinine, Ser: 1.81 mg/dL — ABNORMAL HIGH (ref 0.44–1.00)
GFR calc Af Amer: 31 mL/min — ABNORMAL LOW (ref >60)
GFR calc non Af Amer: 27 mL/min — ABNORMAL LOW (ref >60)
Glucose, Bld: 110 mg/dL — ABNORMAL HIGH (ref 70–99)
Potassium: 3.2 mmol/L — ABNORMAL LOW (ref 3.5–5.1)
Sodium: 135 mmol/L (ref 135–145)
Total Bilirubin: 0.8 mg/dL (ref 0.3–1.2)
Total Protein: 7.4 g/dL (ref 6.5–8.1)

## 2019-04-25 MED ORDER — IOHEXOL 300 MG/ML  SOLN
75.0000 mL | Freq: Once | INTRAMUSCULAR | Status: AC | PRN
  Administered 2019-04-25: 19:00:00 75 mL via INTRAVENOUS

## 2019-04-25 MED ORDER — SODIUM CHLORIDE (PF) 0.9 % IJ SOLN
INTRAMUSCULAR | Status: AC
  Filled 2019-04-25: qty 50

## 2019-04-25 MED ORDER — SODIUM CHLORIDE 0.9 % IV BOLUS
1000.0000 mL | Freq: Once | INTRAVENOUS | Status: AC
  Administered 2019-04-25: 19:00:00 1000 mL via INTRAVENOUS

## 2019-04-25 MED ORDER — SODIUM CHLORIDE 0.9 % IV BOLUS
1000.0000 mL | Freq: Once | INTRAVENOUS | Status: AC
  Administered 2019-04-25: 1000 mL via INTRAVENOUS

## 2019-04-25 NOTE — ED Notes (Signed)
Patient transported to CT 

## 2019-04-25 NOTE — ED Notes (Signed)
Pt aware that urine sample is needed.  

## 2019-04-25 NOTE — ED Notes (Signed)
ED Provider at bedside. 

## 2019-04-25 NOTE — Discharge Instructions (Addendum)
The testing today does not show serious problems.  You do appear dehydrated with elevation of your creatinine, but will improve with increased fluid intake.  Try to drink 2 L of water each day.  Also your potassium is slightly low so try to eat some foods which contain potassium.  It will be important to follow-up with your primary care doctor in a week or so to get your labs checked.  Also see the gastroenterologist, you were referred to for help with eating.

## 2019-04-25 NOTE — ED Notes (Signed)
Pt unable to give urine specimen at this time. Pt given ginger ale as well as ice water and encouraged to drink, and let staff know when can provide specimen.

## 2019-04-25 NOTE — ED Triage Notes (Addendum)
Patient states she has not been able to eat x 2 weeks. Patient denies N/V and states she eats some,but feels full and then can't eat any more.  Patient states her daughter was diagnosed with Covid 9-10- days ago and quarantined.   At the end of triage, sats increased to 98% on O2 2L/min via [place]. Oxygen turned off- Sats 94% on room air.

## 2019-04-25 NOTE — ED Notes (Signed)
Patient is aware that urine sample is needed. Patient will call out when she has to use the restroom.

## 2019-04-25 NOTE — ED Notes (Signed)
Patient give urine sample after a hat was put down on the toilet. Urine was mixed with stool and toilet paper so it was unusable.

## 2019-04-25 NOTE — ED Notes (Signed)
Pt verbalized discharge instructions and follow up care. Alert and ambulatory. No IV. Daughter is driving pt home

## 2019-04-25 NOTE — ED Notes (Signed)
Pt returned from CT °

## 2019-04-25 NOTE — ED Provider Notes (Signed)
[facility] DEPT Provider Note   CSN: [id number] Arrival date & time: 04/25/19  1426     History Chief Complaint  Patient presents with  . can't eat    Sylvia Gutierrez is a 73 y.o. female.  HPI     Patient presents for stated problem of being unable to eat for 2 weeks due to sensation of fullness.  She denies abdominal distention.  She does not have abdominal pain.  There is no back pain, dysuria or urinary frequency.  She has a known sick contacts with COVID-19.  She talked to her doctor, 3 days ago about possibly testing for COVID-19 but decided not to proceed.Marland Kitchen  She was complaining of diarrhea and told to stay on a BRAT diet, and use Imodium as needed.  She states the diarrhea has improved and she denies constipation.  She denies fever, chills, cough, shortness of breath.  She never had this previously.  Past Medical History:  Diagnosis Date  . Anemia   . Colon polyp    colonoscopy 06/2009, due again 2016  . Diverticulosis   . Hemorrhoids   . Hyperlipidemia   . Hypertension   . Onychomycosis     Patient Active Problem List   Diagnosis Date Noted  . Tubular adenoma of colon 12/07/2017  . Primary osteoarthritis of left knee 02/25/2014  . Obesity (BMI 30-39.9) 03/05/2013  . Essential hypertension, benign 11/28/2010  . Pure hypercholesterolemia 11/28/2010    Past Surgical History:  Procedure Laterality Date  . ABDOMINAL HYSTERECTOMY     for bleeding/benign  . CARPAL TUNNEL RELEASE Right   . COLONOSCOPY    . POLYPECTOMY       OB History    Gravida  6   Para  4   Term      Preterm      AB  2   Living  4     SAB  1   TAB      Ectopic  1   Multiple      Live Births              Family History  Problem Relation Age of Onset  . Hypertension Mother   . Cancer Father        colon (66's)  . Colon cancer Father   . Cancer Sister        throat (smoker)  . Esophageal cancer Sister   . Hypertension Brother   .  Diabetes Brother   . Hypertension Brother   . Hypertension Brother   . Cancer Brother        prostate  . Hypertension Daughter   . Breast cancer Neg Hx   . Heart disease Neg Hx   . Colon polyps Neg Hx   . Rectal cancer Neg Hx   . Stomach cancer Neg Hx     Social History   Tobacco Use  . Smoking status: Former Smoker    Quit date: 05/13/2005    Years since quitting: 13.9  . Smokeless tobacco: Never Used  Substance Use Topics  . Alcohol use: No  . Drug use: No    Home Medications Prior to Admission medications   Medication Sig Start Date End Date Taking? Authorizing Provider  aspirin 81 MG tablet Take 81 mg by mouth daily.     Yes [provider]  atenolol (TENORMIN) 25 MG tablet TAKE 1 TABLET(25 MG) BY MOUTH DAILY Patient taking differently: Take 25 mg by mouth daily.  02/25/19  Yes Rita Ohara, MD  cholecalciferol (VITAMIN D) 1000 UNITS tablet Take 1,000 Units by mouth daily.   Yes [provider]  fish oil-omega-3 fatty acids 1000 MG capsule Take 2 g by mouth daily.    Yes [provider]  lisinopril-hydrochlorothiazide (ZESTORETIC) 20-12.5 MG tablet TAKE 1 TABLET BY MOUTH DAILY 02/25/19  Yes Rita Ohara, MD  pravastatin (PRAVACHOL) 40 MG tablet TAKE 1 TABLET(40 MG) BY MOUTH DAILY Patient taking differently: Take 40 mg by mouth daily.  02/25/19  Yes Rita Ohara, MD    Allergies    Patient has no known allergies.  Review of Systems   Review of Systems  All other systems reviewed and are negative.   Physical Exam Updated Vital Signs BP (!) 113/57 (BP Location: Right Arm)   Pulse 89   Temp 98.4 F (36.9 C) (Oral)   Resp 18   Ht 5\' 3"  (1.6 m)   Wt 81.6 kg   SpO2 100%   BMI 31.89 kg/m   Physical Exam Vitals and nursing note reviewed.  Constitutional:      General: She is not in acute distress.    Appearance: She is well-developed. She is not ill-appearing, toxic-appearing or diaphoretic.  HENT:     Head: Normocephalic and atraumatic.       Mouth/Throat:     Mouth: Mucous membranes are moist.     Pharynx: No oropharyngeal exudate or posterior oropharyngeal erythema.  Eyes:     Conjunctiva/sclera: Conjunctivae normal.     Pupils: Pupils are equal, round, and reactive to light.  Neck:     Trachea: Phonation normal.  Cardiovascular:     Rate and Rhythm: Normal rate and regular rhythm.  Pulmonary:     Effort: Pulmonary effort is normal. No respiratory distress.     Breath sounds: Normal breath sounds. No stridor.  Chest:     Chest wall: No tenderness.  Abdominal:     General: There is no distension.     Palpations: Abdomen is soft. There is no mass.     Tenderness: There is no abdominal tenderness. There is no guarding.  Musculoskeletal:        General: Normal range of motion.     Cervical back: Normal range of motion and neck supple.  Skin:    General: Skin is warm and dry.  Neurological:     Mental Status: She is alert and oriented to person, place, and time.     Motor: No abnormal muscle tone.     Comments: No dysarthria or aphasia.  Psychiatric:        Mood and Affect: Mood normal.        Behavior: Behavior normal.        Thought Content: Thought content normal.        Judgment: Judgment normal.     ED Results / Procedures / Treatments   Labs (all labs ordered are listed, but only abnormal results are displayed) Labs Reviewed  COMPREHENSIVE METABOLIC PANEL - Abnormal; Notable for the following components:      Result Value   Potassium 3.2 (*)    Chloride 92 (*)    Glucose, Bld 110 (*)    BUN 50 (*)    Creatinine, Ser 1.81 (*)    Calcium 8.5 (*)    GFR calc non Af Amer 27 (*)    GFR calc Af Amer 31 (*)    All other components within normal limits  CBC WITH DIFFERENTIAL/PLATELET  URINALYSIS,  ROUTINE W REFLEX MICROSCOPIC    EKG EKG Interpretation  Date/Time:  Sunday April 25 2019 16:00:05 EST Ventricular Rate:  69 PR Interval:    QRS Duration: 94 QT Interval:  398 QTC  Calculation: 427 R Axis:   -48 Text Interpretation: Sinus rhythm Left anterior fascicular block Low voltage, precordial leads No old tracing to compare Confirmed by Daleen Bo [phone]) on 04/25/2019 4:03:21 PM   Radiology CT Abdomen Pelvis W Contrast  Result Date: 04/25/2019 CLINICAL DATA:  Abdominal abscess or infection suspected EXAM: CT ABDOMEN AND PELVIS WITH CONTRAST TECHNIQUE: Multidetector CT imaging of the abdomen and pelvis was performed using the standard protocol following bolus administration of intravenous contrast. CONTRAST:  48mL OMNIPAQUE IOHEXOL 300 MG/ML  SOLN COMPARISON:  None. FINDINGS: Lower chest: Bibasilar scarring. Hepatobiliary: No solid liver abnormality is seen. No gallstones, gallbladder wall thickening, or biliary dilatation. Pancreas: Unremarkable. No pancreatic ductal dilatation or surrounding inflammatory changes. Spleen: Normal in size without significant abnormality. Adrenals/Urinary Tract: Adrenal glands are unremarkable. Kidneys are normal, without renal calculi, solid lesion, or hydronephrosis. Bladder is unremarkable. Stomach/Bowel: Stomach is within normal limits. Appendix appears normal. No evidence of bowel wall thickening, distention, or inflammatory changes. Severe pancolonic diverticulosis. Vascular/Lymphatic: Aortic atherosclerosis. No enlarged abdominal or pelvic lymph nodes. Reproductive: No mass or other significant abnormality. Other: No abdominal wall hernia or abnormality. No abdominopelvic ascites. Musculoskeletal: No acute or significant osseous findings. IMPRESSION: 1. No acute CT findings of the abdomen or pelvis. 2. Severe pancolonic diverticulosis without evidence of diverticulitis. 3.  Aortic Atherosclerosis (ICD10-I70.0). Electronically Signed   By: Eddie Candle M.D.   On: 04/25/2019 19:13    Procedures Procedures (including critical care time)  Medications Ordered in ED Medications  sodium chloride (PF) 0.9 % injection (has no  administration in time range)  sodium chloride 0.9 % bolus 1,000 mL (0 mLs Intravenous Stopped 04/25/19 1724)  sodium chloride 0.9 % bolus 1,000 mL (0 mLs Intravenous Stopped 04/25/19 2047)  iohexol (OMNIPAQUE) 300 MG/ML solution 75 mL (75 mLs Intravenous Contrast Given 04/25/19 1842)    ED Course  I have reviewed the triage vital signs and the nursing notes.  Pertinent labs & imaging results that were available during my care of the patient were reviewed by me and considered in my medical decision making (see chart for details).  Clinical Course as of Apr 24 2054  Nancy Fetter Apr 25, 2019  2041 Per radiologist, no acute abnormalities, diffuse pandiverticulosis.  CT Abdomen Pelvis W Contrast [EW]  2042 Normal  CBC with Differential [EW]  2042 Normal except potassium low, low, glucose high, BUN high, creatinine high, calcium low, GFR low  Comprehensive metabolic panel(!) [EW]    Clinical Course User Index [EW] Daleen Bo, MD   MDM Rules/Calculators/A&P     CHA2DS2/VAS Stroke Risk Points      N/A >= 2 Points: High Risk  1 - 1.99 Points: Medium Risk  0 Points: Low Risk    A final score could not be computed because of missing components.: Last  Change: N/A     This score determines the patient's risk of having a stroke if the  patient has atrial fibrillation.      This score is not applicable to this patient. Components are not  calculated.                   Patient Vitals for the past 24 hrs:  BP Temp Temp src Pulse Resp SpO2 Height Weight  04/25/19 1903 Marland Kitchen)  113/57 -- -- 89 18 100 % -- --  04/25/19 1725 127/60 -- -- 66 (!) 62 95 % -- --  04/25/19 1600 110/60 -- -- 70 (!) 22 99 % -- --  04/25/19 1555 112/63 98.4 F (36.9 C) Oral 72 18 95 % -- --  04/25/19 1443 -- -- -- -- -- -- 5\' 3"  (1.6 m) 81.6 kg  04/25/19 1438 96/61 98.3 F (36.8 C) Oral 78 16 90 % -- --    8:43 PM Reevaluation with update and discussion. After initial assessment and treatment, an updated evaluation  reveals she is comfortable and states she feels much better after IV fluid supplementation.  I discussed the findings with her and the importance of hydration to replenish her volume depletion.  She is agreeable.  All questions answered. Daleen Bo   Medical Decision Making: Early satiety with nonspecific abdominal complaints.  Incidental volume depletion likely due to decreased oral intake.  Also associated mild metabolic abnormalities.  Patient has hypokalemia, is currently taking hydrochlorothiazide.  No evidence for abdominal infection, obstruction or mass.  No indication hospitalization at this time.  Patient should be able to normalize her metabolic status and hydration status by intentional oral intake.  CRITICAL CARE-no Performed by: Daleen Bo  Nursing Notes Reviewed/ Care Coordinated Applicable Imaging Reviewed Interpretation of Laboratory Data incorporated into ED treatment  The patient appears reasonably screened and/or stabilized for discharge and I doubt any other medical condition or other [facility] requiring further screening, evaluation, or treatment in the ED at this time prior to discharge.  Plan: Home Medications-continue usual; Home Treatments-increase oral fluid intake to 2 L each day, with dietary potassium to improve clinical status.; return here if the recommended treatment, does not improve the symptoms; Recommended follow up-GI follow-up 1 to 2 weeks for early satiety, and PCP for checkup on labs in about a week.    Final Clinical Impression(s) / ED Diagnoses Final diagnoses:  Early satiety  Dehydration  Renal insufficiency  Hypokalemia    Rx / DC Orders ED Discharge Orders    None       Daleen Bo, MD 04/25/19 2056

## 2019-04-29 DIAGNOSIS — Z1231 Encounter for screening mammogram for malignant neoplasm of breast: Secondary | ICD-10-CM | POA: Diagnosis not present

## 2019-04-29 LAB — HM MAMMOGRAPHY

## 2019-05-03 ENCOUNTER — Encounter: Payer: Self-pay | Admitting: *Deleted

## 2019-05-29 ENCOUNTER — Other Ambulatory Visit: Payer: Self-pay | Admitting: Family Medicine

## 2019-05-29 DIAGNOSIS — E7800 Pure hypercholesterolemia, unspecified: Secondary | ICD-10-CM

## 2019-05-29 DIAGNOSIS — I10 Essential (primary) hypertension: Secondary | ICD-10-CM

## 2019-06-08 ENCOUNTER — Ambulatory Visit (INDEPENDENT_AMBULATORY_CARE_PROVIDER_SITE_OTHER): Payer: Medicare Other | Admitting: Family Medicine

## 2019-06-08 ENCOUNTER — Other Ambulatory Visit: Payer: Self-pay

## 2019-06-08 VITALS — BP 164/94 | HR 64 | Temp 97.1°F | Wt 191.4 lb

## 2019-06-08 DIAGNOSIS — I10 Essential (primary) hypertension: Secondary | ICD-10-CM

## 2019-06-08 DIAGNOSIS — J309 Allergic rhinitis, unspecified: Secondary | ICD-10-CM | POA: Diagnosis not present

## 2019-06-08 NOTE — Patient Instructions (Signed)
Switch to using Rhinocort for your allergies.  Follow-up with Dr. Tomi Bamberger in a month for that and your blood pressure

## 2019-06-08 NOTE — Progress Notes (Signed)
   Subjective:    Patient ID: Sylvia Gutierrez, female    DOB: 01-11-1946, 74 y.o.   MRN: [id number]  HPI She is here for evaluation of difficulty with nosebleed and concern about her blood pressure.  Over the last week she has noted a small amount of blood when she would blow her nose.  She has been on Flonase and stopped it because of that.  She also is on Tenormin and lisinopril/HCTZ.  She does not have a blood pressure cuff at home.  Review of Systems     Objective:   Physical Exam Alert and in no distress.  Exam of the nose shows no evidence of recent bleeding.  Cardiac exam shows regular rhythm without murmurs or gallops.  Lungs are clear to auscultation.       Assessment & Plan:  Essential hypertension, benign  Allergic rhinitis, unspecified seasonality, unspecified trigger I explained that I did not think it appropriate to change her blood pressure medicines based on one reading.  She is to return here in 1 month for follow-up on that. I then explained that the nosebleeds are probably secondary to [facility] and recommend she switch to Rhinocort.  She is to follow-up with Dr. Tomi Bamberger in 1 month concerning both of these.

## 2019-06-14 DIAGNOSIS — J3489 Other specified disorders of nose and nasal sinuses: Secondary | ICD-10-CM | POA: Diagnosis not present

## 2019-07-12 ENCOUNTER — Ambulatory Visit: Payer: Medicare Other | Admitting: Family Medicine

## 2019-08-22 ENCOUNTER — Other Ambulatory Visit: Payer: Self-pay

## 2019-08-22 ENCOUNTER — Encounter (HOSPITAL_COMMUNITY): Payer: Self-pay

## 2019-08-22 ENCOUNTER — Emergency Department (HOSPITAL_COMMUNITY): Payer: Medicare Other

## 2019-08-22 ENCOUNTER — Inpatient Hospital Stay (HOSPITAL_COMMUNITY)
Admission: EM | Admit: 2019-08-22 | Discharge: 2019-08-25 | DRG: 442 | Disposition: A | Discharge status: Home / Self Care | Payer: Medicare Other | Attending: Internal Medicine | Admitting: Internal Medicine

## 2019-08-22 DIAGNOSIS — Z79899 Other long term (current) drug therapy: Secondary | ICD-10-CM | POA: Diagnosis not present

## 2019-08-22 DIAGNOSIS — D72829 Elevated white blood cell count, unspecified: Secondary | ICD-10-CM

## 2019-08-22 DIAGNOSIS — I10 Essential (primary) hypertension: Secondary | ICD-10-CM | POA: Diagnosis present

## 2019-08-22 DIAGNOSIS — R791 Abnormal coagulation profile: Secondary | ICD-10-CM | POA: Diagnosis not present

## 2019-08-22 DIAGNOSIS — N179 Acute kidney failure, unspecified: Secondary | ICD-10-CM | POA: Diagnosis not present

## 2019-08-22 DIAGNOSIS — R509 Fever, unspecified: Secondary | ICD-10-CM | POA: Diagnosis not present

## 2019-08-22 DIAGNOSIS — Z8249 Family history of ischemic heart disease and other diseases of the circulatory system: Secondary | ICD-10-CM | POA: Diagnosis not present

## 2019-08-22 DIAGNOSIS — R300 Dysuria: Secondary | ICD-10-CM | POA: Diagnosis not present

## 2019-08-22 DIAGNOSIS — E876 Hypokalemia: Secondary | ICD-10-CM

## 2019-08-22 DIAGNOSIS — E669 Obesity, unspecified: Secondary | ICD-10-CM | POA: Diagnosis present

## 2019-08-22 DIAGNOSIS — Z20822 Contact with and (suspected) exposure to covid-19: Secondary | ICD-10-CM | POA: Diagnosis not present

## 2019-08-22 DIAGNOSIS — E785 Hyperlipidemia, unspecified: Secondary | ICD-10-CM | POA: Diagnosis not present

## 2019-08-22 DIAGNOSIS — R1110 Vomiting, unspecified: Secondary | ICD-10-CM | POA: Diagnosis not present

## 2019-08-22 DIAGNOSIS — I81 Portal vein thrombosis: Secondary | ICD-10-CM | POA: Diagnosis not present

## 2019-08-22 DIAGNOSIS — Z6832 Body mass index (BMI) 32.0-32.9, adult: Secondary | ICD-10-CM | POA: Diagnosis not present

## 2019-08-22 DIAGNOSIS — Z86010 Personal history of colonic polyps: Secondary | ICD-10-CM

## 2019-08-22 DIAGNOSIS — Z7982 Long term (current) use of aspirin: Secondary | ICD-10-CM | POA: Diagnosis not present

## 2019-08-22 DIAGNOSIS — Z87891 Personal history of nicotine dependence: Secondary | ICD-10-CM | POA: Diagnosis not present

## 2019-08-22 DIAGNOSIS — E869 Volume depletion, unspecified: Secondary | ICD-10-CM | POA: Diagnosis present

## 2019-08-22 DIAGNOSIS — E7800 Pure hypercholesterolemia, unspecified: Secondary | ICD-10-CM | POA: Diagnosis present

## 2019-08-22 DIAGNOSIS — E871 Hypo-osmolality and hyponatremia: Secondary | ICD-10-CM | POA: Diagnosis not present

## 2019-08-22 DIAGNOSIS — Z03818 Encounter for observation for suspected exposure to other biological agents ruled out: Secondary | ICD-10-CM | POA: Diagnosis not present

## 2019-08-22 DIAGNOSIS — K529 Noninfective gastroenteritis and colitis, unspecified: Secondary | ICD-10-CM | POA: Diagnosis present

## 2019-08-22 DIAGNOSIS — Z800 Family history of malignant neoplasm of digestive organs: Secondary | ICD-10-CM

## 2019-08-22 DIAGNOSIS — N390 Urinary tract infection, site not specified: Secondary | ICD-10-CM | POA: Diagnosis not present

## 2019-08-22 LAB — COMPREHENSIVE METABOLIC PANEL
ALT: 18 U/L (ref 0–44)
AST: 17 U/L (ref 15–41)
Albumin: 3.3 g/dL — ABNORMAL LOW (ref 3.5–5.0)
Alkaline Phosphatase: 75 U/L (ref 38–126)
Anion gap: 14 (ref 5–15)
BUN: 19 mg/dL (ref 8–23)
CO2: 26 mmol/L (ref 22–32)
Calcium: 8.6 mg/dL — ABNORMAL LOW (ref 8.9–10.3)
Chloride: 94 mmol/L — ABNORMAL LOW (ref 98–111)
Creatinine, Ser: 1.17 mg/dL — ABNORMAL HIGH (ref 0.44–1.00)
GFR calc Af Amer: 53 mL/min — ABNORMAL LOW (ref >60)
GFR calc non Af Amer: 46 mL/min — ABNORMAL LOW (ref >60)
Glucose, Bld: 118 mg/dL — ABNORMAL HIGH (ref 70–99)
Potassium: 3 mmol/L — ABNORMAL LOW (ref 3.5–5.1)
Sodium: 134 mmol/L — ABNORMAL LOW (ref 135–145)
Total Bilirubin: 1.1 mg/dL (ref 0.3–1.2)
Total Protein: 7.7 g/dL (ref 6.5–8.1)

## 2019-08-22 LAB — LIPASE, BLOOD: Lipase: 32 U/L (ref 11–51)

## 2019-08-22 LAB — PROTIME-INR
INR: 1.3 — ABNORMAL HIGH (ref 0.8–1.2)
Prothrombin Time: 16.3 seconds — ABNORMAL HIGH (ref 11.4–15.2)

## 2019-08-22 LAB — CBC
HCT: 37.7 % (ref 36.0–46.0)
Hemoglobin: 12.8 g/dL (ref 12.0–15.0)
MCH: 28.7 pg (ref 26.0–34.0)
MCHC: 34 g/dL (ref 30.0–36.0)
MCV: 84.5 fL (ref 80.0–100.0)
Platelets: 273 10*3/uL (ref 150–400)
RBC: 4.46 MIL/uL (ref 3.87–5.11)
RDW: 13.5 % (ref 11.5–15.5)
WBC: 18.6 10*3/uL — ABNORMAL HIGH (ref 4.0–10.5)
nRBC: 0 % (ref 0.0–0.2)

## 2019-08-22 LAB — TSH: TSH: 3.267 u[IU]/mL (ref 0.350–4.500)

## 2019-08-22 LAB — APTT: aPTT: 36 seconds (ref 24–36)

## 2019-08-22 MED ORDER — POTASSIUM CHLORIDE IN NACL 20-0.9 MEQ/L-% IV SOLN
INTRAVENOUS | Status: DC
  Filled 2019-08-22 (×6): qty 1000

## 2019-08-22 MED ORDER — HYDRALAZINE HCL 10 MG PO TABS
10.0000 mg | ORAL_TABLET | Freq: Three times a day (TID) | ORAL | Status: DC
  Administered 2019-08-22 – 2019-08-25 (×9): 10 mg via ORAL
  Filled 2019-08-22 (×9): qty 1

## 2019-08-22 MED ORDER — POTASSIUM CHLORIDE 10 MEQ/100ML IV SOLN
10.0000 meq | Freq: Once | INTRAVENOUS | Status: AC
  Administered 2019-08-22: 10 meq via INTRAVENOUS
  Filled 2019-08-22: qty 100

## 2019-08-22 MED ORDER — ONDANSETRON HCL 4 MG/2ML IJ SOLN: 4.0000 mg | Freq: Four times a day (QID) | INTRAMUSCULAR | Status: DC | PRN

## 2019-08-22 MED ORDER — ATENOLOL 25 MG PO TABS
25.0000 mg | ORAL_TABLET | Freq: Every day | ORAL | Status: DC
  Administered 2019-08-22 – 2019-08-25 (×4): 25 mg via ORAL
  Filled 2019-08-22 (×4): qty 1

## 2019-08-22 MED ORDER — POTASSIUM CHLORIDE CRYS ER 20 MEQ PO TBCR
40.0000 meq | EXTENDED_RELEASE_TABLET | Freq: Once | ORAL | Status: AC
  Administered 2019-08-22: 16:00:00 40 meq via ORAL
  Filled 2019-08-22: qty 2

## 2019-08-22 MED ORDER — ONDANSETRON HCL 4 MG/2ML IJ SOLN
4.0000 mg | Freq: Once | INTRAMUSCULAR | Status: AC
  Administered 2019-08-22: 4 mg via INTRAVENOUS
  Filled 2019-08-22: qty 2

## 2019-08-22 MED ORDER — HEPARIN (PORCINE) 25000 UT/250ML-% IV SOLN
1250.0000 [IU]/h | INTRAVENOUS | Status: DC
  Administered 2019-08-22: 16:00:00 1100 [IU]/h via INTRAVENOUS
  Filled 2019-08-22 (×2): qty 250

## 2019-08-22 MED ORDER — HEPARIN BOLUS VIA INFUSION
2000.0000 [IU] | Freq: Once | INTRAVENOUS | Status: AC
  Administered 2019-08-22: 16:00:00 2000 [IU] via INTRAVENOUS
  Filled 2019-08-22: qty 2000

## 2019-08-22 MED ORDER — SODIUM CHLORIDE 0.9 % IV BOLUS
1000.0000 mL | Freq: Once | INTRAVENOUS | Status: AC
  Administered 2019-08-22: 14:00:00 1000 mL via INTRAVENOUS

## 2019-08-22 MED ORDER — IOHEXOL 300 MG/ML  SOLN
100.0000 mL | Freq: Once | INTRAMUSCULAR | Status: AC | PRN
  Administered 2019-08-22: 100 mL via INTRAVENOUS

## 2019-08-22 MED ORDER — SODIUM CHLORIDE (PF) 0.9 % IJ SOLN
INTRAMUSCULAR | Status: AC
  Filled 2019-08-22: qty 50

## 2019-08-22 MED ORDER — ASPIRIN EC 81 MG PO TBEC
81.0000 mg | DELAYED_RELEASE_TABLET | Freq: Every day | ORAL | Status: DC
  Administered 2019-08-22 – 2019-08-23 (×2): 81 mg via ORAL
  Filled 2019-08-22 (×2): qty 1

## 2019-08-22 MED ORDER — SODIUM CHLORIDE 0.9% FLUSH: 3.0000 mL | Freq: Once | INTRAVENOUS | Status: DC

## 2019-08-22 MED ORDER — ONDANSETRON HCL 4 MG PO TABS: 4.0000 mg | ORAL_TABLET | Freq: Four times a day (QID) | ORAL | Status: DC | PRN

## 2019-08-22 NOTE — ED Provider Notes (Signed)
[place] DEPT Provider Note   CSN: [id number] Arrival date & time: 08/22/19  1122     History Chief Complaint  Patient presents with  . Emesis  . Diarrhea    Sylvia Gutierrez is a 74 y.o. female.  Patient complains of vomiting and profuse diarrhea for last 24 hours.  Minimal abdominal discomfort no blood in her vomit or diarrhea  The history is provided by the patient. No language interpreter was used.  Emesis Severity:  Moderate Timing:  Intermittent Quality:  Stomach contents Able to tolerate:  Liquids Progression:  Worsening Chronicity:  New Recent urination:  Decreased Context: not post-tussive   Relieved by:  Nothing Worsened by:  Nothing Ineffective treatments:  None tried Associated symptoms: diarrhea   Associated symptoms: no abdominal pain, no cough and no headaches   Risk factors: no alcohol use        Past Medical History:  Diagnosis Date  . Anemia   . Colon polyp    colonoscopy 06/2009, due again 2016  . Diverticulosis   . Hemorrhoids   . Hyperlipidemia   . Hypertension   . Onychomycosis     Patient Active Problem List   Diagnosis Date Noted  . Allergic rhinitis 06/08/2019  . Tubular adenoma of colon 12/07/2017  . Primary osteoarthritis of left knee 02/25/2014  . Obesity (BMI 30-39.9) 03/05/2013  . Essential hypertension, benign 11/28/2010  . Pure hypercholesterolemia 11/28/2010    Past Surgical History:  Procedure Laterality Date  . ABDOMINAL HYSTERECTOMY     for bleeding/benign  . CARPAL TUNNEL RELEASE Right   . COLONOSCOPY    . POLYPECTOMY       OB History    Gravida  6   Para  4   Term      Preterm      AB  2   Living  4     SAB  1   TAB      Ectopic  1   Multiple      Live Births              Family History  Problem Relation Age of Onset  . Hypertension Mother   . Cancer Father        colon (65's)  . Colon cancer Father   . Cancer Sister        throat (smoker)  .  Esophageal cancer Sister   . Hypertension Brother   . Diabetes Brother   . Hypertension Brother   . Hypertension Brother   . Cancer Brother        prostate  . Hypertension Daughter   . Breast cancer Neg Hx   . Heart disease Neg Hx   . Colon polyps Neg Hx   . Rectal cancer Neg Hx   . Stomach cancer Neg Hx     Social History   Tobacco Use  . Smoking status: Former Smoker    Quit date: 05/13/2005    Years since quitting: 14.2  . Smokeless tobacco: Never Used  Substance Use Topics  . Alcohol use: No  . Drug use: No    Home Medications Prior to Admission medications   Medication Sig Start Date End Date Taking? Authorizing Provider  aspirin 81 MG tablet Take 81 mg by mouth daily.     Yes [provider]  atenolol (TENORMIN) 25 MG tablet TAKE 1 TABLET(25 MG) BY MOUTH DAILY Patient taking differently: Take 25 mg by mouth daily.  05/31/19  Yes Rita Ohara, MD  cholecalciferol (VITAMIN D) 1000 UNITS tablet Take 1,000 Units by mouth daily.   Yes [provider]  fish oil-omega-3 fatty acids 1000 MG capsule Take 2 g by mouth daily.    Yes [provider]  lisinopril-hydrochlorothiazide (ZESTORETIC) 20-12.5 MG tablet TAKE 1 TABLET BY MOUTH DAILY 05/31/19  Yes Rita Ohara, MD  pravastatin (PRAVACHOL) 40 MG tablet TAKE 1 TABLET(40 MG) BY MOUTH DAILY Patient taking differently: Take 40 mg by mouth daily.  05/31/19  Yes Rita Ohara, MD    Allergies    Patient has no known allergies.  Review of Systems   Review of Systems  Constitutional: Negative for appetite change and fatigue.  HENT: Negative for congestion, ear discharge and sinus pressure.   Eyes: Negative for discharge.  Respiratory: Negative for cough.   Cardiovascular: Negative for chest pain.  Gastrointestinal: Positive for diarrhea, nausea and vomiting. Negative for abdominal pain.  Genitourinary: Negative for frequency and hematuria.  Musculoskeletal: Negative for back pain.  Skin: Negative for rash.    Neurological: Negative for seizures and headaches.  Psychiatric/Behavioral: Negative for hallucinations.    Physical Exam Updated Vital Signs BP (!) 149/87   Pulse 90   Temp 100 F (37.8 C) (Oral)   Resp 18   Ht 5\' 3"  (1.6 m)   Wt 83.9 kg   SpO2 98%   BMI 32.77 kg/m   Physical Exam Vitals and nursing note reviewed.  Constitutional:      Appearance: She is well-developed.  HENT:     Head: Normocephalic.     Nose: Nose normal.     Mouth/Throat:     Comments: Dry mucous membrane Eyes:     General: No scleral icterus.    Conjunctiva/sclera: Conjunctivae normal.  Neck:     Thyroid: No thyromegaly.  Cardiovascular:     Rate and Rhythm: Normal rate and regular rhythm.     Heart sounds: No murmur. No friction rub. No gallop.   Pulmonary:     Breath sounds: No stridor. No wheezing or rales.  Chest:     Chest wall: No tenderness.  Abdominal:     General: There is no distension.     Tenderness: There is no abdominal tenderness. There is no rebound.  Musculoskeletal:        General: Normal range of motion.     Cervical back: Neck supple.  Lymphadenopathy:     Cervical: No cervical adenopathy.  Skin:    Findings: No erythema or rash.  Neurological:     Mental Status: She is alert and oriented to person, place, and time.     Motor: No abnormal muscle tone.     Coordination: Coordination normal.  Psychiatric:        Behavior: Behavior normal.     ED Results / Procedures / Treatments   Labs (all labs ordered are listed, but only abnormal results are displayed) Labs Reviewed  COMPREHENSIVE METABOLIC PANEL - Abnormal; Notable for the following components:      Result Value   Sodium 134 (*)    Potassium 3.0 (*)    Chloride 94 (*)    Glucose, Bld 118 (*)    Creatinine, Ser 1.17 (*)    Calcium 8.6 (*)    Albumin 3.3 (*)    GFR calc non Af Amer 46 (*)    GFR calc Af Amer 53 (*)    All other components within normal limits  CBC - Abnormal; Notable for the following  components:   WBC 18.6 (*)    All other components within normal limits  LIPASE, BLOOD  URINALYSIS, ROUTINE W REFLEX MICROSCOPIC    EKG None  Radiology CT ABDOMEN PELVIS W CONTRAST  Result Date: 08/22/2019 CLINICAL DATA:  Abdominal pain with vomiting and diarrhea. EXAM: CT ABDOMEN AND PELVIS WITH CONTRAST TECHNIQUE: Multidetector CT imaging of the abdomen and pelvis was performed using the standard protocol following bolus administration of intravenous contrast. CONTRAST:  141mL OMNIPAQUE IOHEXOL 300 MG/ML  SOLN COMPARISON:  Abdominopelvic CT 04/25/2019. FINDINGS: Lower chest: Interval improved aeration of the lung bases with mild residual linear scarring or atelectasis bilaterally. No pleural or pericardial effusion. Aortic atherosclerosis noted. Hepatobiliary: New complete thrombosis of the left portal vein. The right and main portal veins are patent. Mildly asymmetric hepatic enhancement attributed to the portal vein thrombosis. No focal hepatic lesions identified. No evidence of gallstones, gallbladder wall thickening or biliary dilatation. Pancreas: Unremarkable. No pancreatic ductal dilatation or surrounding inflammatory changes. Spleen: Normal in size without focal abnormality. Adrenals/Urinary Tract: Both adrenal glands appear normal. Stable small right renal cysts. No evidence of urinary tract calculus or hydronephrosis. The bladder appears normal. Stomach/Bowel: The stomach, small bowel and appendix appear normal. There are extensive diverticular changes throughout the colon with mild sigmoid colon wall thickening, similar to previous study. No surrounding inflammatory changes to suggest active diverticulitis. No evidence of bowel obstruction or perforation. Vascular/Lymphatic: There are no enlarged abdominal or pelvic lymph nodes. Small lymph nodes in the porta hepatis are slightly more prominent, although likely reactive. As above, thrombosis of the left portal vein. The right and main  portal veins, superior mesenteric and splenic veins are patent. The IVC and iliac veins appear patent. Aortic and branch vessel atherosclerosis without evidence of large vessel arterial occlusion. Reproductive: Hysterectomy. Stable appearance of the adnexa without suspicious findings. Other: No evidence of abdominal wall mass or hernia. No ascites. Musculoskeletal: No acute or significant osseous findings. Stable lumbar spondylosis. IMPRESSION: 1. 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 No signs of underlying malignancy. 2. Extensive colonic diverticulosis without evidence of active diverticulitis. 3. Aortic Atherosclerosis (ICD10-I70.0). 4. Critical Value/emergent results were called by telephone at the time of interpretation on 08/22/2019 at 3:15 pm to provider Jahad Old , who verbally acknowledged these results. Electronically Signed   By: Richardean Sale M.D.   On: 08/22/2019 15:17    Procedures Procedures (including critical care time)  Medications Ordered in ED Medications  sodium chloride flush (NS) 0.9 % injection 3 mL (0 mLs Intravenous Hold 08/22/19 1414)  sodium chloride (PF) 0.9 % injection (has no administration in time range)  sodium chloride 0.9 % bolus 1,000 mL (1,000 mLs Intravenous Bolus from Bag 08/22/19 1413)  ondansetron (ZOFRAN) injection 4 mg (4 mg Intravenous Given 08/22/19 1414)  iohexol (OMNIPAQUE) 300 MG/ML solution 100 mL (100 mLs Intravenous Contrast Given 08/22/19 1421)    ED Course  I have reviewed the triage vital signs and the nursing notes.  Pertinent labs & imaging results that were available during my care of the patient were reviewed by me and considered in my medical decision making (see chart for details).    MDM Rules/Calculators/A&P                     CT scan shows thrombosis portal vein.  Patient will be admitted.  Dr. Darl Householder arranging the admission     This patient presents to the ED for concern of vomiting and diarrhea,  this involves an  extensive number of treatment options, and is a complaint that carries with it a high risk of complications and morbidity.  The differential diagnosis includes colitis diverticulitis   Lab Tests:   I Ordered, reviewed, and interpreted labs, which included CBC chemistry, she has hyperkalemia and leukocytosis  Medicines ordered:   I ordered medication normal saline for dehydration  Imaging Studies ordered:   I ordered imaging studies which included CT abdomen and  I independently visualized and interpreted imaging which showed portal vein thrombosis  Additional history obtained:   Additional history obtained from direct  Previous records obtained and reviewed   Consultations Obtained: Dr.yao consulted oncology  Reevaluation:  After the interventions stated above, I reevaluated the patient and found patient improved with IV fluids  Critical Interventions:  .   Final Clinical Impression(s) / ED Diagnoses Final diagnoses:  None    Rx / DC Orders ED Discharge Orders    None       Milton Ferguson, MD 08/23/19 1056

## 2019-08-22 NOTE — ED Provider Notes (Signed)
  Physical Exam  BP (!) 146/85   Pulse 84   Temp 100 F (37.8 C) (Oral)   Resp 18   Ht 5\' 3"  (1.6 m)   Wt 83.9 kg   SpO2 94%   BMI 32.77 kg/m   Physical Exam  ED Course/Procedures     Procedures  MDM  Care assumed at signout.  Patient has gastroenteritis symptoms for about 2 days .  Patient has a white count of 18 .  Her CT showed portal vein thrombosis. Signout pending hematology consult as well as admission.  4:27 PM Hematology, Dr. Alen Blew, recommended heparin and bridged to oral anticoagulation.  He did not feel strongly about doing a hypercoagulable work-up in a 74 year old.  Patient does not have a history of A. Fib.  He states that gastroenteritis can cause portal vein thrombosis.  Hospitalist to admit.  CRITICAL CARE Performed by: Wandra Arthurs   Total critical care time: 30 minutes  Critical care time was exclusive of separately billable procedures and treating other patients.  Critical care was necessary to treat or prevent imminent or life-threatening deterioration.  Critical care was time spent personally by me on the following activities: development of treatment plan with patient and/or surrogate as well as nursing, discussions with consultants, evaluation of patient's response to treatment, examination of patient, obtaining history from patient or surrogate, ordering and performing treatments and interventions, ordering and review of laboratory studies, ordering and review of radiographic studies, pulse oximetry and re-evaluation of patient's condition.        Drenda Freeze, MD 08/22/19 [phone]

## 2019-08-22 NOTE — ED Triage Notes (Signed)
Pt presents with c/o vomiting and diarrhea that started yesterday. Pt reports she is unable to keep any food down. Pt c/o soreness in her stomach.

## 2019-08-22 NOTE — H&P (Signed)
[facility] History and Physical  [name] [id number] DOB: 1945-08-24 DOA: 08/22/2019  Referring physician: ED  PCP: Rita Ohara, MD   Patient is coming from: Home  Chief Complaint: Abdominal pain  HPI: Sylvia Gutierrez is a 74 y.o. female with past medical history of hypertension, hyperlipidemia presented to hospital with nausea vomiting and profuse diarrhea for the last 24 hours with abdominal discomfort.  Patient stated that she has been having intermittent abdominal pain for the last 1 month or so.  For the last 24 hours or so she has had 4-5 bowel movements without any blood loose in consistency with nausea and vomiting.  Patient denies any hematochezia or melena.  Denies any fever chills or rigor.  He denies any urinary urgency frequency dysuria.  Denies any shortness of breath, cough, chest pain, palpitation, dizziness or lightheadedness.  Denies any syncope.  Patient denies being vaccinated for Covid.  Denies any history of Covid in the family recently  ED Course: In the ED, she was noted to be volume depleted and hypokalemic.  Patient underwent CT scan of the abdomen which showed portal vein thrombosis.  Hemato-oncology was consulted from the ED who recommended anticoagulation and outpatient follow-up.  In the ED patient received IV potassium 10 mEq, 40 p.o. potassium, and heparin drip and the patient has been patient has been considered for observation in hospital for IV fluids electrolyte replacement and anticoagulation.  Review of Systems:  All systems were reviewed and were negative unless otherwise mentioned in the HPI  Past Medical History:  Diagnosis Date  . Anemia   . Colon polyp    colonoscopy 06/2009, due again 2016  . Diverticulosis   . Hemorrhoids   . Hyperlipidemia   . Hypertension   . Onychomycosis    Past Surgical History:  Procedure Laterality Date  . ABDOMINAL HYSTERECTOMY     for bleeding/benign  . CARPAL TUNNEL RELEASE Right   .  COLONOSCOPY    . POLYPECTOMY      Social History:  reports that she quit smoking about 14 years ago. She has never used smokeless tobacco. She reports that she does not drink alcohol or use drugs.  No Known Allergies  Family History  Problem Relation Age of Onset  . Hypertension Mother   . Cancer Father        colon (5's)  . Colon cancer Father   . Cancer Sister        throat (smoker)  . Esophageal cancer Sister   . Hypertension Brother   . Diabetes Brother   . Hypertension Brother   . Hypertension Brother   . Cancer Brother        prostate  . Hypertension Daughter   . Breast cancer Neg Hx   . Heart disease Neg Hx   . Colon polyps Neg Hx   . Rectal cancer Neg Hx   . Stomach cancer Neg Hx      Prior to Admission medications   Medication Sig Start Date End Date Taking? Authorizing Provider  aspirin 81 MG tablet Take 81 mg by mouth daily.     Yes [provider]  atenolol (TENORMIN) 25 MG tablet TAKE 1 TABLET(25 MG) BY MOUTH DAILY Patient taking differently: Take 25 mg by mouth daily.  05/31/19  Yes Rita Ohara, MD  cholecalciferol (VITAMIN D) 1000 UNITS tablet Take 1,000 Units by mouth daily.   Yes [provider]  fish oil-omega-3 fatty acids 1000 MG capsule Take 2 g by  mouth daily.    Yes [provider]  lisinopril-hydrochlorothiazide (ZESTORETIC) 20-12.5 MG tablet TAKE 1 TABLET BY MOUTH DAILY 05/31/19  Yes Rita Ohara, MD  pravastatin (PRAVACHOL) 40 MG tablet TAKE 1 TABLET(40 MG) BY MOUTH DAILY Patient taking differently: Take 40 mg by mouth daily.  05/31/19  Yes Rita Ohara, MD    Physical Exam: Vitals:   08/22/19 1136 08/22/19 1400 08/22/19 1550 08/22/19 1600  BP: (!) 157/81 (!) 149/87 (!) 151/73 (!) 146/85  Pulse: 95 90 81 84  Resp: 18 18 18 18   Temp: 100 F (37.8 C)     TempSrc: Oral     SpO2: 95% 98% 96% 94%  Weight: 83.9 kg     Height: 5\' 3"  (1.6 m)      Wt Readings from Last 3 Encounters:  08/22/19 83.9 kg  06/08/19 86.8 kg    04/25/19 81.6 kg   Body mass index is 32.77 kg/m.  General:  Average built, not in obvious distress, obese HENT: Normocephalic, pupils equally reacting to light and accommodation.  No scleral pallor or icterus noted. Oral mucosa is moist.  Chest:  Clear breath sounds.  Diminished breath sounds bilaterally. No crackles or wheezes.  CVS: S1 &S2 heard. No murmur.  Regular rate and rhythm. Abdomen: Soft, nontender, nondistended.  Obese abdomen.  Bowel sounds are heard.  Liver is not palpable, no abdominal mass palpated Extremities: No cyanosis, clubbing or edema.  Peripheral pulses are palpable. Psych: Alert, awake and oriented, normal mood CNS:  No cranial nerve deficits.  Power equal in all extremities.   No cerebellar signs.   Skin: Warm and dry.  No rashes noted.  Labs on Admission:   CBC: Recent Labs  Lab 08/22/19 1215  WBC 18.6*  HGB 12.8  HCT 37.7  MCV 84.5  PLT 123456    Basic Metabolic Panel: Recent Labs  Lab 08/22/19 1215  NA 134*  K 3.0*  CL 94*  CO2 26  GLUCOSE 118*  BUN 19  CREATININE 1.17*  CALCIUM 8.6*    Liver Function Tests: Recent Labs  Lab 08/22/19 1215  AST 17  ALT 18  ALKPHOS 75  BILITOT 1.1  PROT 7.7  ALBUMIN 3.3*   Recent Labs  Lab 08/22/19 1215  LIPASE 32   No results for input(s): AMMONIA in the last 168 hours.  Cardiac Enzymes: No results for input(s): CKTOTAL, CKMB, CKMBINDEX, TROPONINI in the last 168 hours.  BNP (last 3 results) No results for input(s): BNP in the last 8760 hours.  ProBNP (last 3 results) No results for input(s): PROBNP in the last 8760 hours.  CBG: No results for input(s): GLUCAP in the last 168 hours.  Lipase     Component Value Date/Time   LIPASE 32 08/22/2019 1215     Urinalysis    Component Value Date/Time   BILIRUBINUR neg 02/14/2014 1008   PROTEINUR neg 02/14/2014 1008   UROBILINOGEN negative 02/14/2014 1008   NITRITE neg 02/14/2014 1008   LEUKOCYTESUR moderate (2+) 02/14/2014 1008      Drugs of Abuse  No results found for: LABOPIA, COCAINSCRNUR, LABBENZ, AMPHETMU, THCU, LABBARB    Radiological Exams on Admission: CT ABDOMEN PELVIS W CONTRAST  Result Date: 08/22/2019 CLINICAL DATA:  Abdominal pain with vomiting and diarrhea. EXAM: CT ABDOMEN AND PELVIS WITH CONTRAST TECHNIQUE: Multidetector CT imaging of the abdomen and pelvis was performed using the standard protocol following bolus administration of intravenous contrast. CONTRAST:  147mL OMNIPAQUE IOHEXOL 300 MG/ML  SOLN COMPARISON:  Abdominopelvic  CT 04/25/2019. FINDINGS: Lower chest: Interval improved aeration of the lung bases with mild residual linear scarring or atelectasis bilaterally. No pleural or pericardial effusion. Aortic atherosclerosis noted. Hepatobiliary: New complete thrombosis of the left portal vein. The right and main portal veins are patent. Mildly asymmetric hepatic enhancement attributed to the portal vein thrombosis. No focal hepatic lesions identified. No evidence of gallstones, gallbladder wall thickening or biliary dilatation. Pancreas: Unremarkable. No pancreatic ductal dilatation or surrounding inflammatory changes. Spleen: Normal in size without focal abnormality. Adrenals/Urinary Tract: Both adrenal glands appear normal. Stable small right renal cysts. No evidence of urinary tract calculus or hydronephrosis. The bladder appears normal. Stomach/Bowel: The stomach, small bowel and appendix appear normal. There are extensive diverticular changes throughout the colon with mild sigmoid colon wall thickening, similar to previous study. No surrounding inflammatory changes to suggest active diverticulitis. No evidence of bowel obstruction or perforation. Vascular/Lymphatic: There are no enlarged abdominal or pelvic lymph nodes. Small lymph nodes in the porta hepatis are slightly more prominent, although likely reactive. As above, thrombosis of the left portal vein. The right and main portal veins, superior  mesenteric and splenic veins are patent. The IVC and iliac veins appear patent. Aortic and branch vessel atherosclerosis without evidence of large vessel arterial occlusion. Reproductive: Hysterectomy. Stable appearance of the adnexa without suspicious findings. Other: No evidence of abdominal wall mass or hernia. No ascites. Musculoskeletal: No acute or significant osseous findings. Stable lumbar spondylosis. IMPRESSION: 1. 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 No signs of underlying malignancy. 2. Extensive colonic diverticulosis without evidence of active diverticulitis. 3. Aortic Atherosclerosis (ICD10-I70.0). 4. Critical Value/emergent results were called by telephone at the time of interpretation on 08/22/2019 at 3:15 pm to provider JOSEPH ZAMMIT , who verbally acknowledged these results. Electronically Signed   By: Richardean Sale M.D.   On: 08/22/2019 15:17    EKG: Not available for review  Assessment/Plan Principal Problem:   Portal vein thrombosis Active Problems:   Essential hypertension, benign   Pure hypercholesterolemia   Obesity (BMI 30-39.9)   Acute portal vein thrombosis Unknown etiology.  Could be secondary to gastroenteritis.  Patient had been having some intermittent abdominal pain.  CT scan does not show any evidence of malignancy.  Dr Alen Blew was notified from the ED.  He recommended anticoagulation and outpatient follow-up.  Acute gastroenteritis nausea vomiting diarrhea.  Will support with fluids and electrolytes.  Volume depletion secondary to nausea vomiting and diarrhea.  We will continue with IV fluid hydration overnight.  Significant hypokalemia.  Patient received p.o. and IV potassium in the ED.  We will continue to replenish as necessary.  Check BMP in am.  Check magnesium levels in a.m. as well.  Mild hyponatremia.  Secondary to volume depletion.  Check CBC in a.m.  Leukocytosis.  Could be reactive/ secondary to volume depletion.  Close monitor.   Check CBC in a.m.  Elevated creatinine level, mild acute kidney injury.  Monitor with BMP.  Continue IV fluid hydration.  Creatinine of 0.9 almost 10 months back.  Mildly elevated INR.  Monitor LFTs in a.m.  Essential hypertension.  Increase atenolol lisinopril- HCTZ at home.  Continue IV fluids for now.  Resume atenolol.  Hold lisinopril.  Add as needed hydralazine.  Hyperlipidemia.  On pravastatin at home.  Will hold for now.  DVT Prophylaxis: Heparin drip  Consultant: Dr Alen Blew oncology was notified from the ED  Code Status: Full code  Microbiology none  Antibiotics: None  Family Communication:  Patients' condition and plan of care including tests being ordered have been discussed with the patient who indicate understanding and agree with the plan.  Disposition Plan: Home likely tomorrow  Severity of Illness: The appropriate patient status for this patient is OBSERVATION. Observation status is judged to be reasonable and necessary in order to provide the required intensity of service to ensure the patient's safety. The patient's presenting symptoms, physical exam findings, and initial radiographic and laboratory data in the context of their medical condition is felt to place them at decreased risk for further clinical deterioration. Furthermore, it is anticipated that the patient will be medically stable for discharge from the hospital within 2 midnights of admission.   Signed, Flora Lipps, MD [facility] 08/22/2019

## 2019-08-22 NOTE — Progress Notes (Signed)
ANTICOAGULATION CONSULT NOTE - Initial Consult  Pharmacy Consult for Heparin Indication: portal vein thrombosis  No Known Allergies  Patient Measurements: Height: 5\' 3"  (160 cm) Weight: 83.9 kg (185 lb) IBW/kg (Calculated) : 52.4 HEPARIN DW (KG): 71   Vital Signs: Temp: 100 F (37.8 C) (04/11 1136) Temp Source: Oral (04/11 1136) BP: 149/87 (04/11 1400) Pulse Rate: 90 (04/11 1400)  Labs: Recent Labs    08/22/19 1215  HGB 12.8  HCT 37.7  PLT 273  CREATININE 1.17*    Estimated Creatinine Clearance: 43.3 mL/min (A) (by C-G formula based on SCr of 1.17 mg/dL (H)).   Medical History: Past Medical History:  Diagnosis Date  . Anemia   . Colon polyp    colonoscopy 06/2009, due again 2016  . Diverticulosis   . Hemorrhoids   . Hyperlipidemia   . Hypertension   . Onychomycosis     Medications:  Infusions:  . sodium chloride    . potassium chloride     No anticoagulants on PTA med list  Assessment: 74 yo F with new portal vein thrombosis on abdominal CT.   Baseline CBC WNL.  Baseline coags ordered.  No bleeding noted.   Goal of Therapy:  Heparin level 0.3-0.7 units/ml Monitor platelets by anticoagulation protocol: Yes   Plan:  Heparin 2000 units IV bolus x1 followed by heparin infusion at 1100 units/hr Check 8h heparin level  Daily CBC & Heparin level  Monitor for s/sx of bleeding  Netta Cedars PharmD, BCPS 08/22/2019,3:35 PM

## 2019-08-23 DIAGNOSIS — R509 Fever, unspecified: Secondary | ICD-10-CM

## 2019-08-23 DIAGNOSIS — E669 Obesity, unspecified: Secondary | ICD-10-CM | POA: Diagnosis not present

## 2019-08-23 DIAGNOSIS — N390 Urinary tract infection, site not specified: Secondary | ICD-10-CM

## 2019-08-23 DIAGNOSIS — E7800 Pure hypercholesterolemia, unspecified: Secondary | ICD-10-CM | POA: Diagnosis not present

## 2019-08-23 DIAGNOSIS — K529 Noninfective gastroenteritis and colitis, unspecified: Secondary | ICD-10-CM

## 2019-08-23 DIAGNOSIS — I10 Essential (primary) hypertension: Secondary | ICD-10-CM | POA: Diagnosis not present

## 2019-08-23 DIAGNOSIS — I81 Portal vein thrombosis: Secondary | ICD-10-CM | POA: Diagnosis not present

## 2019-08-23 LAB — CBC
HCT: 37 % (ref 36.0–46.0)
Hemoglobin: 12 g/dL (ref 12.0–15.0)
MCH: 28.3 pg (ref 26.0–34.0)
MCHC: 32.4 g/dL (ref 30.0–36.0)
MCV: 87.3 fL (ref 80.0–100.0)
Platelets: 269 10*3/uL (ref 150–400)
RBC: 4.24 MIL/uL (ref 3.87–5.11)
RDW: 13.7 % (ref 11.5–15.5)
WBC: 16.5 10*3/uL — ABNORMAL HIGH (ref 4.0–10.5)
nRBC: 0 % (ref 0.0–0.2)

## 2019-08-23 LAB — URINALYSIS, ROUTINE W REFLEX MICROSCOPIC
Bilirubin Urine: NEGATIVE
Glucose, UA: NEGATIVE mg/dL
Ketones, ur: 20 mg/dL — AB
Nitrite: NEGATIVE
Protein, ur: 100 mg/dL — AB
Specific Gravity, Urine: 1.028 (ref 1.005–1.030)
pH: 5 (ref 5.0–8.0)

## 2019-08-23 LAB — COMPREHENSIVE METABOLIC PANEL
ALT: 18 U/L (ref 0–44)
AST: 21 U/L (ref 15–41)
Albumin: 3 g/dL — ABNORMAL LOW (ref 3.5–5.0)
Alkaline Phosphatase: 71 U/L (ref 38–126)
Anion gap: 13 (ref 5–15)
BUN: 15 mg/dL (ref 8–23)
CO2: 22 mmol/L (ref 22–32)
Calcium: 8.1 mg/dL — ABNORMAL LOW (ref 8.9–10.3)
Chloride: 101 mmol/L (ref 98–111)
Creatinine, Ser: 1.08 mg/dL — ABNORMAL HIGH (ref 0.44–1.00)
GFR calc Af Amer: 59 mL/min — ABNORMAL LOW (ref >60)
GFR calc non Af Amer: 51 mL/min — ABNORMAL LOW (ref >60)
Glucose, Bld: 103 mg/dL — ABNORMAL HIGH (ref 70–99)
Potassium: 3.8 mmol/L (ref 3.5–5.1)
Sodium: 136 mmol/L (ref 135–145)
Total Bilirubin: 1 mg/dL (ref 0.3–1.2)
Total Protein: 7.1 g/dL (ref 6.5–8.1)

## 2019-08-23 LAB — MAGNESIUM: Magnesium: 2.1 mg/dL (ref 1.7–2.4)

## 2019-08-23 LAB — HEPARIN LEVEL (UNFRACTIONATED): Heparin Unfractionated: 0.11 IU/mL — ABNORMAL LOW (ref 0.30–0.70)

## 2019-08-23 LAB — SARS CORONAVIRUS 2 (TAT 6-24 HRS): SARS Coronavirus 2: NEGATIVE

## 2019-08-23 LAB — C DIFFICILE QUICK SCREEN W PCR REFLEX
C Diff antigen: NEGATIVE
C Diff interpretation: NOT DETECTED
C Diff toxin: NEGATIVE

## 2019-08-23 MED ORDER — ACETAMINOPHEN 325 MG PO TABS
650.0000 mg | ORAL_TABLET | Freq: Four times a day (QID) | ORAL | Status: DC | PRN
  Administered 2019-08-23 – 2019-08-24 (×3): 650 mg via ORAL
  Filled 2019-08-23 (×3): qty 2

## 2019-08-23 MED ORDER — LOPERAMIDE HCL 2 MG PO CAPS
2.0000 mg | ORAL_CAPSULE | ORAL | Status: DC | PRN
  Administered 2019-08-23: 16:00:00 2 mg via ORAL
  Filled 2019-08-23: qty 1

## 2019-08-23 MED ORDER — APIXABAN 5 MG PO TABS
10.0000 mg | ORAL_TABLET | Freq: Two times a day (BID) | ORAL | Status: DC
  Administered 2019-08-23 – 2019-08-25 (×5): 10 mg via ORAL
  Filled 2019-08-23 (×5): qty 2
  Filled 2019-08-23: qty 4

## 2019-08-23 MED ORDER — APIXABAN 5 MG PO TABS: 5.0000 mg | ORAL_TABLET | Freq: Two times a day (BID) | ORAL | Status: DC

## 2019-08-23 MED ORDER — SODIUM CHLORIDE 0.9 % IV SOLN
1.0000 g | INTRAVENOUS | Status: DC
  Administered 2019-08-23 – 2019-08-24 (×2): 1 g via INTRAVENOUS
  Filled 2019-08-23: qty 10
  Filled 2019-08-23 (×2): qty 1

## 2019-08-23 NOTE — Evaluation (Signed)
Physical Therapy Evaluation Patient Details Name: Sylvia Gutierrez MRN: [id number] DOB: 02/21/1946 Today's Date: 08/23/2019   History of Present Illness  74 yo female admitted with portal vein thrombosis, gastroenteritis.  Clinical Impression  On eval, pt was Min guard assist for mobility. She walked ~75 feet while holding on to IV pole for support. Pt reported some fatigue and general weakness from ongoing diarrhea. Will continue to follow. Pt could potentially progress well and not need any f/u PT at discharge.     Follow Up Recommendations Home health PT;No PT follow up(depending on progress)    Equipment Recommendations  None recommended by PT    Recommendations for Other Services       Precautions / Restrictions Precautions Precautions: Fall Restrictions Weight Bearing Restrictions: No      Mobility  Bed Mobility               General bed mobility comments: pt up with nursing  Transfers Overall transfer level: Needs assistance   Transfers: Sit to/from Stand Sit to Stand: Supervision            Ambulation/Gait Ambulation/Gait assistance: Min guard Gait Distance (Feet): 75 Feet Assistive device: IV Pole Gait Pattern/deviations: Step-through pattern;Decreased stride length     General Gait Details: Min guard for safety. Pt tolerated distance well. She did c/o some fatigue weakness  Stairs            Wheelchair Mobility    Modified Rankin (Stroke Patients Only)       Balance Overall balance assessment: Mild deficits observed, not formally tested                                           Pertinent Vitals/Pain Pain Assessment: No/denies pain    Home Living Family/patient expects to be discharged to:: Private residence Living Arrangements: Alone   Type of Home: House       Home Layout: One level Home Equipment: None      Prior Function Level of Independence: Independent               Hand Dominance       Extremity/Trunk Assessment   Upper Extremity Assessment Upper Extremity Assessment: Defer to OT evaluation    Lower Extremity Assessment Lower Extremity Assessment: Generalized weakness    Cervical / Trunk Assessment Cervical / Trunk Assessment: Normal  Communication   Communication: No difficulties  Cognition Arousal/Alertness: Awake/alert Behavior During Therapy: WFL for tasks assessed/performed Overall Cognitive Status: Within Functional Limits for tasks assessed                                        General Comments      Exercises     Assessment/Plan    PT Assessment Patient needs continued PT services  PT Problem List Decreased strength;Decreased mobility;Decreased activity tolerance       PT Treatment Interventions Gait training;Therapeutic activities;Therapeutic exercise;Patient/family education;Balance training;Functional mobility training;DME instruction    PT Goals (Current goals can be found in the Care Plan section)  Acute Rehab PT Goals Patient Stated Goal: diarrhea resolved PT Goal Formulation: With patient Time For Goal Achievement: 09/06/19 Potential to Achieve Goals: Good    Frequency Min 3X/week   Barriers to discharge        Co-evaluation  AM-PAC PT "6 Clicks" Mobility  Outcome Measure Help needed turning from your back to your side while in a flat bed without using bedrails?: None Help needed moving from lying on your back to sitting on the side of a flat bed without using bedrails?: None Help needed moving to and from a bed to a chair (including a wheelchair)?: A Little Help needed standing up from a chair using your arms (e.g., wheelchair or bedside chair)?: A Little Help needed to walk in hospital room?: A Little Help needed climbing 3-5 steps with a railing? : A Little 6 Click Score: 20    End of Session   Activity Tolerance: Patient tolerated treatment well Patient left: in bed;with call  bell/phone within reach   PT Visit Diagnosis: Muscle weakness (generalized) (M62.81);Unsteadiness on feet (R26.81)    Time: [id number] PT Time Calculation (min) (ACUTE ONLY): 9 min   Charges:   PT Evaluation $PT Eval Low Complexity: 1 Low             Aubrianna Orchard P, PT Acute Rehabilitation

## 2019-08-23 NOTE — TOC Initial Note (Signed)
Transition of Care [facility]) - Initial/Assessment Note    Patient Details  Name: Sylvia Gutierrez MRN: [id number] Date of Birth: [birth year]  Transition of Care Sylvia Gutierrez [facility]) CM/SW Contact:    Sylvia Phi, RN Phone Number: 08/23/2019, 9:54 AM  Clinical Narrative:CM referral for benefit check-await outcome for eliquis for patient's co pay.                   Expected Discharge Plan: Home/Self Care Barriers to Discharge: Continued Medical Work up   Patient Goals and CMS Choice Patient states their goals for this hospitalization and ongoing recovery are:: go home      Expected Discharge Plan and Services Expected Discharge Plan: Home/Self Care   Discharge Planning Services: CM Consult   Living arrangements for the past 2 months: Single Family Home                                      Prior Living Arrangements/Services Living arrangements for the past 2 months: Single Family Home Lives with:: Self Patient language and need for interpreter reviewed:: Yes Do you feel safe going back to the place where you live?: Yes      Need for Family Participation in Patient Care: No (Comment) Care giver support system in place?: Yes (comment)   Criminal Activity/Legal Involvement Pertinent to Current Situation/Hospitalization: No - Comment as needed  Activities of Daily Living Home Assistive Devices/Equipment: Eyeglasses ADL Screening (condition at time of admission) Patient's cognitive ability adequate to safely complete daily activities?: Yes Is the patient deaf or have difficulty hearing?: No Does the patient have difficulty seeing, even when wearing glasses/contacts?: No Does the patient have difficulty concentrating, remembering, or making decisions?: No Patient able to express need for assistance with ADLs?: Yes Does the patient have difficulty dressing or bathing?: No Independently performs ADLs?: Yes (appropriate for developmental age) Does the patient have difficulty walking or  climbing stairs?: No Weakness of Legs: None Weakness of Arms/Hands: None  Permission Sought/Granted Permission sought to share information with : Case Manager Permission granted to share information with : Yes, Verbal Permission Granted  Share Information with NAME: Case Manager     Permission granted to share info w Relationship: Dtr Sylvia Gutierrez [phone]     Emotional Assessment Appearance:: Appears stated age Attitude/Demeanor/Rapport: Gracious Affect (typically observed): Accepting Orientation: : Oriented to Self, Oriented to Place, Oriented to  Time, Oriented to Situation Alcohol / Substance Use: Not Applicable Psych Involvement: No (comment)  Admission diagnosis:  Portal vein thrombosis [I81] Patient Active Problem List   Diagnosis Date Noted  . Portal vein thrombosis 08/22/2019  . Allergic rhinitis 06/08/2019  . Tubular adenoma of colon 12/07/2017  . Primary osteoarthritis of left knee 02/25/2014  . Obesity (BMI 30-39.9) 03/05/2013  . Essential hypertension, benign 11/28/2010  . Pure hypercholesterolemia 11/28/2010   PCP:  Sylvia Ohara, MD Pharmacy:   [facility] [place], [place] - [address] [place] [address] Phone: [phone] Fax: [phone]     Social Determinants of Health (SDOH) Interventions    Readmission Risk Interventions No flowsheet data found.

## 2019-08-23 NOTE — Progress Notes (Signed)
PROGRESS NOTE  Sylvia Gutierrez [id number] DOB: 25-May-1945 DOA: 08/22/2019 PCP: Rita Ohara, MD   LOS: 0 days   Brief narrative: As per HPI,  Sylvia Gutierrez is a 73 y.o. female with past medical history of hypertension, hyperlipidemia presented to hospital with nausea, vomiting and profuse diarrhea for the last 24 hours with abdominal discomfort.  Patient stated that she has been having intermittent abdominal pain for the last 1 month or so.  24 hours prior to presentation, patient  had 4-5 bowel movements without any blood, loose in consistency with nausea and vomiting. ED Course: In the ED, she was noted to be volume depleted and hypokalemic.  Patient underwent CT scan of the abdomen which showed portal vein thrombosis.  Hemato-oncology was consulted from the ED who recommended anticoagulation and outpatient follow-up.  In the ED patient received IV potassium 10 mEq, 40 p.o. potassium, and heparin drip and the patient was considered for observation in hospital.  Assessment/Plan:  Principal Problem:   Portal vein thrombosis Active Problems:   Essential hypertension, benign   Pure hypercholesterolemia   Obesity (BMI 30-39.9) Nausea vomiting diarrhea Fever, possible UTI  Acute portal vein thrombosis Unknown etiology.  CT scan does not show any evidence of malignancy.  Did have a Colitis symptoms which could be contributing. Dr Alen Blew was notified from the ED.  He recommended anticoagulation and outpatient follow-up.  Patient was initially on heparin drip and was changed to Eliquis at that time.  Acute gastroenteritis nausea, vomiting diarrhea.  Improving symptoms except for diarrhea.  Stool for C. difficile and GI pathogen panel. Will support with fluids and electrolytes.  Patient has been spiking fever leukocytosis..  We will get a urine culture blood cultures today.  Leukocytosis trended down to 16.5 from 18.6.  Abnormal urinalysis with spike of fever, leukocytosis nausea vomiting.   Start the patient on Rocephin.  Check a C. difficile.  Urine culture.  Volume depletion secondary to nausea vomiting and diarrhea.  We will continue with IV fluid hydration for now.  Improving  Significant hypokalemia.  Patient received p.o. and IV potassium in the ED. improved potassium level at this time.  Potassium of 3.8 today.  No anemia.  Mild hyponatremia.  Secondary to volume depletion.  Improved.  Check BMP daily.  Leukocytosis.  Could be reactive/ secondary to UTI/gastroenteritis Close monitor.  Check CBC in a.m. IV Rocephin today.  Elevated creatinine level, mild acute kidney injury.    Creatinine of 1.0 today.  Continue IV fluid today.  Creatinine of 0.9 almost 10 months back.  Mildly elevated INR.   Check INR in a.m.  INR 1.3 on presentation.  Essential hypertension.  Increase atenolol lisinopril- HCTZ at home.  Continue IV fluids for now.  Resume atenolol.  Hold lisinopril.  Add as needed hydralazine.  Hyperlipidemia.  On pravastatin at home.  Will hold for now.  VTE Prophylaxis: Lovenox subcu  Code Status: Full code  Family Communication: None  Disposition Plan:   Patient is from home  Likely disposition to home likely in 1 to 2 days  Barriers to discharge: Significant leukocytosis new fever, volume depletion, portal vein thrombosis on anticoagulation.   Consultants:  Hematooncology already consulted on the ED  Procedures:  None  Antibiotics:   Rocephin IV for 08/23/19>  Anti-infectives (From admission, onward)   Start     Dose/Rate Route Frequency Ordered Stop   08/23/19 1500  cefTRIAXone (ROCEPHIN) 1 g in sodium chloride 0.9 % 100 mL IVPB  1 g 200 mL/hr over 30 Minutes Intravenous Every 24 hours 08/23/19 1342       Subjective: Today, patient was seen and examined at bedside.  Complains of loose stools.  Denies abdominal pain.  Denies nausea or vomiting today.  Had a spike of fever.  Objective: Vitals:   08/23/19 0544 08/23/19 1426    BP: (!) 126/58 (!) 155/97  Pulse: 77 80  Resp: 20 20  Temp: 98.7 F (37.1 C) 98.6 F (37 C)  SpO2: 97% 100%    Intake/Output Summary (Last 24 hours) at 08/23/2019 1428 Last data filed at 08/23/2019 1010 Gross per 24 hour  Intake 915.66 ml  Output 0 ml  Net 915.66 ml   Filed Weights   08/22/19 1136 08/22/19 1715  Weight: 83.9 kg 86.6 kg   Body mass index is 33.82 kg/m.   Physical Exam: GENERAL: Patient is alert awake and oriented. Not in obvious distress, obese HENT: No scleral pallor or icterus. Pupils equally reactive to light. Oral mucosa is moist NECK: is supple, no gross swelling noted. CHEST: Clear to auscultation. No crackles or wheezes.  Diminished breath sounds bilaterally. CVS: S1 and S2 heard, no murmur. Regular rate and rhythm.  ABDOMEN: Soft, non-tender, bowel sounds are present.  Obese abdomen EXTREMITIES: No edema. CNS: Cranial nerves are intact. No focal motor deficits. SKIN: warm and dry without rashes.  Data Review: I have personally reviewed the following laboratory data and studies,  CBC: Recent Labs  Lab 08/22/19 1215 08/23/19 0022  WBC 18.6* 16.5*  HGB 12.8 12.0  HCT 37.7 37.0  MCV 84.5 87.3  PLT 273 [id number]   Basic Metabolic Panel: Recent Labs  Lab 08/22/19 1215 08/23/19 0022  NA 134* 136  K 3.0* 3.8  CL 94* 101  CO2 26 22  GLUCOSE 118* 103*  BUN 19 15  CREATININE 1.17* 1.08*  CALCIUM 8.6* 8.1*  MG  --  2.1   Liver Function Tests: Recent Labs  Lab 08/22/19 1215 08/23/19 0022  AST 17 21  ALT 18 18  ALKPHOS 75 71  BILITOT 1.1 1.0  PROT 7.7 7.1  ALBUMIN 3.3* 3.0*   Recent Labs  Lab 08/22/19 1215  LIPASE 32   No results for input(s): AMMONIA in the last 168 hours. Cardiac Enzymes: No results for input(s): CKTOTAL, CKMB, CKMBINDEX, TROPONINI in the last 168 hours. BNP (last 3 results) No results for input(s): BNP in the last 8760 hours.  ProBNP (last 3 results) No results for input(s): PROBNP in the last 8760  hours.  CBG: No results for input(s): GLUCAP in the last 168 hours. Recent Results (from the past 240 hour(s))  SARS CORONAVIRUS 2 (TAT 6-24 HRS) Nasopharyngeal Nasopharyngeal Swab     Status: None   Collection Time: 08/22/19  4:52 PM   Specimen: Nasopharyngeal Swab  Result Value Ref Range Status   SARS Coronavirus 2 NEGATIVE NEGATIVE Final    Comment: (NOTE) SARS-CoV-2 target nucleic acids are NOT DETECTED. The SARS-CoV-2 RNA is generally detectable in upper and lower respiratory specimens during the acute phase of infection. Negative results do not preclude SARS-CoV-2 infection, do not rule out co-infections with other pathogens, and should not be used as the sole basis for treatment or other patient management decisions. Negative results must be combined with clinical observations, patient history, and epidemiological information. The expected result is Negative. Fact Sheet for Patients: SugarRoll.be Fact Sheet for Healthcare Providers: https://www.woods-mathews.com/ This test is not yet approved or cleared by the [place] FDA  and  has been authorized for detection and/or diagnosis of SARS-CoV-2 by FDA under an Emergency Use Authorization (EUA). This EUA will remain  in effect (meaning this test can be used) for the duration of the COVID-19 declaration under Section 56 4(b)(1) of the Act, 21 U.S.C. section 360bbb-3(b)(1), unless the authorization is terminated or revoked sooner. Performed at [facility], [place] [address] [place], [place] [address]   C Difficile Quick Screen w PCR reflex     Status: None   Collection Time: 08/23/19 12:00 PM   Specimen: STOOL  Result Value Ref Range Status   C Diff antigen NEGATIVE NEGATIVE Final   C Diff toxin NEGATIVE NEGATIVE Final   C Diff interpretation No C. difficile detected.  Final    Comment: Performed at [facility], [place] [address] [place], [place] [address]      Studies: CT ABDOMEN PELVIS W CONTRAST  Result Date: 08/22/2019 CLINICAL DATA:  Abdominal pain with vomiting and diarrhea. EXAM: CT ABDOMEN AND PELVIS WITH CONTRAST TECHNIQUE: Multidetector CT imaging of the abdomen and pelvis was performed using the standard protocol following bolus administration of intravenous contrast. CONTRAST:  13mL OMNIPAQUE IOHEXOL 300 MG/ML  SOLN COMPARISON:  Abdominopelvic CT 04/25/2019. FINDINGS: Lower chest: Interval improved aeration of the lung bases with mild residual linear scarring or atelectasis bilaterally. No pleural or pericardial effusion. Aortic atherosclerosis noted. Hepatobiliary: New complete thrombosis of the left portal vein. The right and main portal veins are patent. Mildly asymmetric hepatic enhancement attributed to the portal vein thrombosis. No focal hepatic lesions identified. No evidence of gallstones, gallbladder wall thickening or biliary dilatation. Pancreas: Unremarkable. No pancreatic ductal dilatation or surrounding inflammatory changes. Spleen: Normal in size without focal abnormality. Adrenals/Urinary Tract: Both adrenal glands appear normal. Stable small right renal cysts. No evidence of urinary tract calculus or hydronephrosis. The bladder appears normal. Stomach/Bowel: The stomach, small bowel and appendix appear normal. There are extensive diverticular changes throughout the colon with mild sigmoid colon wall thickening, similar to previous study. No surrounding inflammatory changes to suggest active diverticulitis. No evidence of bowel obstruction or perforation. Vascular/Lymphatic: There are no enlarged abdominal or pelvic lymph nodes. Small lymph nodes in the porta hepatis are slightly more prominent, although likely reactive. As above, thrombosis of the left portal vein. The right and main portal veins, superior mesenteric and splenic veins are patent. The IVC and iliac veins appear patent. Aortic and branch vessel atherosclerosis without  evidence of large vessel arterial occlusion. Reproductive: Hysterectomy. Stable appearance of the adnexa without suspicious findings. Other: No evidence of abdominal wall mass or hernia. No ascites. Musculoskeletal: No acute or significant osseous findings. Stable lumbar spondylosis. IMPRESSION: 1. 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 No signs of underlying malignancy. 2. Extensive colonic diverticulosis without evidence of active diverticulitis. 3. Aortic Atherosclerosis (ICD10-I70.0). 4. Critical Value/emergent results were called by telephone at the time of interpretation on 08/22/2019 at 3:15 pm to provider JOSEPH ZAMMIT , who verbally acknowledged these results. Electronically Signed   By: Richardean Sale M.D.   On: 08/22/2019 15:17      Flora Lipps, MD  [facility] 08/23/2019

## 2019-08-23 NOTE — Discharge Instructions (Addendum)
Information on my medicine - ELIQUIS (apixaban)  Why was Eliquis prescribed for you? Eliquis was prescribed to treat a blood clot that was found in your portal vein and to reduce the risk of blood clots from occurring again.  What do You need to know about Eliquis ? The starting dose is 10 mg (two 5 mg tablets) taken TWICE daily for the FIRST SEVEN (7) DAYS, then on Monday, 08/30/19, the dose is reduced to ONE 5 mg tablet taken TWICE daily.  Eliquis may be taken with or without food.   Try to take the dose about the same time in the morning and in the evening. If you have difficulty swallowing the tablet whole please discuss with your pharmacist how to take the medication safely.  Take Eliquis exactly as prescribed and DO NOT stop taking Eliquis without talking to the doctor who prescribed the medication.  Stopping may increase your risk of developing a new blood clot.  Refill your prescription before you run out.  After discharge, you should have regular check-up appointments with your healthcare provider that is prescribing your Eliquis.    What do you do if you miss a dose? If a dose of ELIQUIS is not taken at the scheduled time, take it as soon as possible on the same day and twice-daily administration should be resumed. The dose should not be doubled to make up for a missed dose.  Important Safety Information A possible side effect of Eliquis is bleeding. You should call your healthcare provider right away if you experience any of the following: ? Bleeding from an injury or your nose that does not stop. ? Unusual colored urine (red or dark brown) or unusual colored stools (red or black). ? Unusual bruising for unknown reasons. ? A serious fall or if you hit your head (even if there is no bleeding).  Some medicines may interact with Eliquis and might increase your risk of bleeding or clotting while on Eliquis. To help avoid this, consult your healthcare provider or pharmacist  prior to using any new prescription or non-prescription medications, including herbals, vitamins, non-steroidal anti-inflammatory drugs (NSAIDs) and supplements.  This website has more information on Eliquis (apixaban): http://www.eliquis.com/eliquis/home

## 2019-08-23 NOTE — Progress Notes (Signed)
ANTICOAGULATION CONSULT NOTE - Follow Up Consult  Pharmacy Consult for Heparin Indication: Portal Vein Thrombosis  No Known Allergies  Patient Measurements: Height: 5\' 3"  (160 cm) Weight: 86.6 kg (190 lb 14.4 oz) IBW/kg (Calculated) : 52.4 Heparin Dosing Weight:   Vital Signs: Temp: 101.2 F (38.4 C) (04/12 0148) Temp Source: Oral (04/12 0148) BP: 129/63 (04/12 0148) Pulse Rate: 77 (04/12 0148)  Labs: Recent Labs    08/22/19 1215 08/22/19 1540 08/23/19 0022  HGB 12.8  --  12.0  HCT 37.7  --  37.0  PLT 273  --  269  APTT  --  36  --   LABPROT  --  16.3*  --   INR  --  1.3*  --   HEPARINUNFRC  --   --  0.11*  CREATININE 1.17*  --  1.08*    Estimated Creatinine Clearance: 47.7 mL/min (A) (by C-G formula based on SCr of 1.08 mg/dL (H)).   Medications:  Infusions:  . 0.9 % NaCl with KCl 20 mEq / L 125 mL/hr at 08/22/19 1900  . heparin 1,100 Units/hr (08/22/19 1900)    Assessment: Patient with low heparin level.  No heparin issues per RN.  Goal of Therapy:  Heparin level 0.3-0.7 units/ml Monitor platelets by anticoagulation protocol: Yes   Plan:  Increase heparin to 1250 units/hr Recheck level at [address] [place] 08/23/2019,1:59 AM

## 2019-08-23 NOTE — Evaluation (Signed)
Occupational Therapy Evaluation Patient Details Name: Sylvia Gutierrez MRN: [id number] DOB: 12-24-1945 Today's Date: 08/23/2019    History of Present Illness 74 yo female admitted with portal vein thrombosis, gastroenteritis.   Clinical Impression   Pt admitted with the above. Pt currently with functional limitations due to the deficits listed below (see OT Problem List).  Pt will benefit from skilled OT to increase their safety and independence with ADL and functional mobility for ADL to facilitate discharge to venue listed below.   Pts lives alone but daughter can A as needed     Follow Up Recommendations  Home health OT;Supervision/Assistance - 24 hour    Equipment Recommendations  3 in 1 bedside commode    Recommendations for Other Services       Precautions / Restrictions Precautions Precautions: Fall Restrictions Weight Bearing Restrictions: No      Mobility Bed Mobility               General bed mobility comments: pt up with nursing  Transfers Overall transfer level: Needs assistance   Transfers: Sit to/from Stand Sit to Stand: Supervision              Balance Overall balance assessment: Mild deficits observed, not formally tested                                         ADL either performed or assessed with clinical judgement   ADL Overall ADL's : Needs assistance/impaired Eating/Feeding: Set up;Sitting Eating/Feeding Details (indicate cue type and reason): sat EOB with OT and had ice cream Grooming: Wash/dry hands;Wash/dry face;Set up   Upper Body Bathing: Minimal assistance;Cueing for safety;Cueing for sequencing;Sitting   Lower Body Bathing: Moderate assistance;Cueing for safety;Cueing for sequencing   Upper Body Dressing : Minimal assistance;Sitting   Lower Body Dressing: Moderate assistance;Cueing for safety;Cueing for sequencing   Toilet Transfer: Minimal assistance;Stand-pivot;Cueing for sequencing;Cueing for  safety   Toileting- Clothing Manipulation and Hygiene: Moderate assistance;Sit to/from stand Toileting - Clothing Manipulation Details (indicate cue type and reason): pt with loose stool this day.  Cdiff negative             Vision         Perception     Praxis      Pertinent Vitals/Pain Pain Assessment: No/denies pain     Hand Dominance     Extremity/Trunk Assessment Upper Extremity Assessment Upper Extremity Assessment: Generalized weakness   Lower Extremity Assessment Lower Extremity Assessment: Generalized weakness   Cervical / Trunk Assessment Cervical / Trunk Assessment: Normal   Communication Communication Communication: No difficulties   Cognition Arousal/Alertness: Awake/alert Behavior During Therapy: WFL for tasks assessed/performed Overall Cognitive Status: Within Functional Limits for tasks assessed                                                Home Living Family/patient expects to be discharged to:: Private residence Living Arrangements: Alone Available Help at Discharge: Family Type of Home: House       Home Layout: One level     Bathroom Shower/Tub: Chief Strategy Officer: None          Prior Functioning/Environment Level of Independence: Independent  OT Problem List: Decreased strength;Decreased activity tolerance;Impaired balance (sitting and/or standing);Decreased safety awareness;Decreased knowledge of use of DME or AE      OT Treatment/Interventions: Self-care/ADL training;DME and/or AE instruction    OT Goals(Current goals can be found in the care plan section) Acute Rehab OT Goals Patient Stated Goal: diarrhea resolved OT Goal Formulation: With patient Time For Goal Achievement: 08/30/19 ADL Goals Pt Will Perform Grooming: Independently;standing Pt Will Perform Lower Body Dressing: with modified independence Pt Will Transfer to Toilet: with modified  independence;regular height toilet Pt Will Perform Toileting - Clothing Manipulation and hygiene: with modified independence;sit to/from stand  OT Frequency: Min 2X/week    AM-PAC OT "6 Clicks" Daily Activity     Outcome Measure Help from another person eating meals?: None Help from another person taking care of personal grooming?: None Help from another person toileting, which includes using toliet, bedpan, or urinal?: A Little Help from another person bathing (including washing, rinsing, drying)?: A Little Help from another person to put on and taking off regular upper body clothing?: None Help from another person to put on and taking off regular lower body clothing?: A Lot 6 Click Score: 20   End of Session Equipment Utilized During Treatment: Rolling walker Nurse Communication: Mobility status  Activity Tolerance: Patient limited by fatigue Patient left: in bed;with bed alarm set  OT Visit Diagnosis: Unsteadiness on feet (R26.81);Muscle weakness (generalized) (M62.81)                Time: [id number] OT Time Calculation (min): 13 min Charges:  OT General Charges $OT Visit: 1 Visit OT Evaluation $OT Eval Moderate Complexity: 1 Mod  Kari Baars, OT Acute Rehabilitation Services [phone] Office- Julian, Edwena Felty D 08/23/2019, 7:10 PM

## 2019-08-23 NOTE — Care Management Obs Status (Signed)
[facility] NOTIFICATION   Patient Details  Name: MODIE WESS MRN: [id number] Date of Birth: Apr 19, 1946   Medicare Observation Status Notification Given:  Yes    MahabirJuliann Pulse, RN 08/23/2019, 11:56 AM

## 2019-08-23 NOTE — TOC Benefit Eligibility Note (Signed)
Transition of Care [facility]) Benefit Eligibility Note    Patient Details  Name: RIGBY SWAMY MRN: [id number] Date of Birth: [birth year]   Medication/Dose: 10 mg po bid x 7days then 21m bid     Tier: 3 Drug  Prescription Coverage Preferred Pharmacy: local pharmacy  Spoke with Person/Company/Phone Number:: Kevin/ [employer] [phone] Co-Pay: $47.00  Prior Approval: No  Deductible: Met       FKerin SalenPhone Number: 08/23/2019, 10:21 AM

## 2019-08-23 NOTE — TOC Progression Note (Signed)
Transition of Care [facility]) - Progression Note    Patient Details  Name: Sylvia Gutierrez MRN: [id number] Date of Birth: 1945/10/18  Transition of Care [facility]) CM/SW Contact  Sulay Brymer, Juliann Pulse, RN Phone Number: 08/23/2019, 10:36 AM  Clinical Narrative:  Benefit check in see prior note.     Expected Discharge Plan: Home/Self Care Barriers to Discharge: Continued Medical Work up  Expected Discharge Plan and Services Expected Discharge Plan: Home/Self Care   Discharge Planning Services: CM Consult   Living arrangements for the past 2 months: Single Family Home                                       Social Determinants of Health (SDOH) Interventions    Readmission Risk Interventions No flowsheet data found.

## 2019-08-24 DIAGNOSIS — N179 Acute kidney failure, unspecified: Secondary | ICD-10-CM | POA: Diagnosis present

## 2019-08-24 DIAGNOSIS — Z86010 Personal history of colonic polyps: Secondary | ICD-10-CM | POA: Diagnosis not present

## 2019-08-24 DIAGNOSIS — E876 Hypokalemia: Secondary | ICD-10-CM | POA: Diagnosis present

## 2019-08-24 DIAGNOSIS — E785 Hyperlipidemia, unspecified: Secondary | ICD-10-CM | POA: Diagnosis present

## 2019-08-24 DIAGNOSIS — R791 Abnormal coagulation profile: Secondary | ICD-10-CM | POA: Diagnosis present

## 2019-08-24 DIAGNOSIS — R300 Dysuria: Secondary | ICD-10-CM | POA: Diagnosis not present

## 2019-08-24 DIAGNOSIS — Z87891 Personal history of nicotine dependence: Secondary | ICD-10-CM | POA: Diagnosis not present

## 2019-08-24 DIAGNOSIS — Z800 Family history of malignant neoplasm of digestive organs: Secondary | ICD-10-CM | POA: Diagnosis not present

## 2019-08-24 DIAGNOSIS — E7800 Pure hypercholesterolemia, unspecified: Secondary | ICD-10-CM | POA: Diagnosis present

## 2019-08-24 DIAGNOSIS — E871 Hypo-osmolality and hyponatremia: Secondary | ICD-10-CM | POA: Diagnosis present

## 2019-08-24 DIAGNOSIS — Z20822 Contact with and (suspected) exposure to covid-19: Secondary | ICD-10-CM | POA: Diagnosis present

## 2019-08-24 DIAGNOSIS — Z7982 Long term (current) use of aspirin: Secondary | ICD-10-CM | POA: Diagnosis not present

## 2019-08-24 DIAGNOSIS — I81 Portal vein thrombosis: Secondary | ICD-10-CM | POA: Diagnosis present

## 2019-08-24 DIAGNOSIS — K529 Noninfective gastroenteritis and colitis, unspecified: Secondary | ICD-10-CM | POA: Diagnosis present

## 2019-08-24 DIAGNOSIS — E869 Volume depletion, unspecified: Secondary | ICD-10-CM | POA: Diagnosis present

## 2019-08-24 DIAGNOSIS — Z79899 Other long term (current) drug therapy: Secondary | ICD-10-CM | POA: Diagnosis not present

## 2019-08-24 DIAGNOSIS — E669 Obesity, unspecified: Secondary | ICD-10-CM | POA: Diagnosis present

## 2019-08-24 DIAGNOSIS — I10 Essential (primary) hypertension: Secondary | ICD-10-CM | POA: Diagnosis present

## 2019-08-24 DIAGNOSIS — Z8249 Family history of ischemic heart disease and other diseases of the circulatory system: Secondary | ICD-10-CM | POA: Diagnosis not present

## 2019-08-24 DIAGNOSIS — Z6832 Body mass index (BMI) 32.0-32.9, adult: Secondary | ICD-10-CM | POA: Diagnosis not present

## 2019-08-24 LAB — PROTIME-INR
INR: 1.7 — ABNORMAL HIGH (ref 0.8–1.2)
Prothrombin Time: 19.7 seconds — ABNORMAL HIGH (ref 11.4–15.2)

## 2019-08-24 LAB — BASIC METABOLIC PANEL
Anion gap: 8 (ref 5–15)
BUN: 13 mg/dL (ref 8–23)
CO2: 23 mmol/L (ref 22–32)
Calcium: 7.9 mg/dL — ABNORMAL LOW (ref 8.9–10.3)
Chloride: 108 mmol/L (ref 98–111)
Creatinine, Ser: 0.91 mg/dL (ref 0.44–1.00)
GFR calc Af Amer: >60 mL/min (ref >60)
GFR calc non Af Amer: >60 mL/min (ref >60)
Glucose, Bld: 94 mg/dL (ref 70–99)
Potassium: 4.5 mmol/L (ref 3.5–5.1)
Sodium: 139 mmol/L (ref 135–145)

## 2019-08-24 LAB — CBC
HCT: 33.7 % — ABNORMAL LOW (ref 36.0–46.0)
Hemoglobin: 10.5 g/dL — ABNORMAL LOW (ref 12.0–15.0)
MCH: 28.1 pg (ref 26.0–34.0)
MCHC: 31.2 g/dL (ref 30.0–36.0)
MCV: 90.1 fL (ref 80.0–100.0)
Platelets: 252 10*3/uL (ref 150–400)
RBC: 3.74 MIL/uL — ABNORMAL LOW (ref 3.87–5.11)
RDW: 14 % (ref 11.5–15.5)
WBC: 11.8 10*3/uL — ABNORMAL HIGH (ref 4.0–10.5)
nRBC: 0 % (ref 0.0–0.2)

## 2019-08-24 LAB — MAGNESIUM: Magnesium: 2.1 mg/dL (ref 1.7–2.4)

## 2019-08-24 NOTE — Progress Notes (Signed)
Pt is alert and oriented x 4. Pt requesting discharge information. Read MD progress note to pt indicating they are hoping to discharge her tomorrow if her fever does not return. Pt verbalized understanding.

## 2019-08-24 NOTE — Progress Notes (Addendum)
PROGRESS NOTE  Sylvia Gutierrez [id number] DOB: [birth year] DOA: 08/22/2019 PCP: Rita Ohara, MD   LOS: 0 days   Brief narrative: As per HPI,  Sylvia Gutierrez is a 74 y.o. female with past medical history of hypertension, hyperlipidemia presented to hospital with nausea, vomiting and profuse diarrhea for the last 24 hours with abdominal discomfort.  Patient stated that she has been having intermittent abdominal pain for the last 1 month or so.  24 hours prior to presentation, patient  had 4-5 bowel movements without any blood, loose in consistency with nausea and vomiting. ED Course: In the ED, she was noted to be volume depleted and hypokalemic.  Patient underwent CT scan of the abdomen which showed portal vein thrombosis.  Hemato-oncology was consulted from the ED who recommended anticoagulation and outpatient follow-up.  In the ED patient received IV potassium 10 mEq, 40 p.o. potassium, and heparin drip and the patient was considered for observation in hospital.  Assessment/Plan:  Principal Problem:   Portal vein thrombosis Active Problems:   Essential hypertension, benign   Pure hypercholesterolemia   Obesity (BMI 30-39.9) Nausea vomiting diarrhea Fever, possible UTI  Acute portal vein thrombosis Unknown etiology.  CT scan does not show any evidence of malignancy.  Did have gastroenteritis symptoms which could be contributing. Dr Alen Blew was notified from the ED.  He recommended anticoagulation and outpatient follow-up.  Patient was initially on heparin drip and has been changed to Eliquis at that time.  Tolerating anticoagulation well so far.  Patient will need to follow-up with oncology as outpatient.  Acute gastroenteritis nausea, vomiting, diarrhea on presentation.  Improving symptoms.  T-max of 101 F yesterday 8 PM.  C. difficile was negative.    Leukocytosis trended down to 11.8 from 18.6.  As needed Imodium.  Abnormal urinalysis with spike of fever, leukocytosis, nausea  vomiting.  Possible UTI.  On IV Rocephin.  C. difficile is negative.  Urine culture pending.  Volume depletion secondary to nausea vomiting and diarrhea.    Improving.    Significant hypokalemia.  Patient received p.o. and IV potassium in the ED. potassium of 4.5 today.  Mild hyponatremia.  Secondary to volume depletion.  Improved.  Check BMP in a.m. encourage oral fluid intake.  Leukocytosis.    Improving.  On IV Rocephin.  Elevated creatinine level, mild acute kidney injury.    Creatinine of 0.9 today.  Will discontinue IV fluids today.  Mildly elevated INR.   INR of 1.7.  Patient has been started on Eliquis.  Essential hypertension.   On atenolol currently.  Lisinopril- HCTZ hold.     Hyperlipidemia.  On pravastatin at home.  Will hold for now.  VTE Prophylaxis: Lovenox subcu  Code Status: Full code  Family Communication: I spoke with the patient's daughter on the phone and updated her about the clinical condition of the patient.  Disposition Plan:  . Patient is from home . Likely disposition to home with home health likely by tomorrow if fever improves/spikes . Barriers to discharge: watch for fever, volume depletion, portal vein thrombosis on anticoagulation.   Consultants:  Hematooncology phone consultation  Procedures:  None  Antibiotics:  . Rocephin IV for 08/23/19>  Anti-infectives (From admission, onward)   Start     Dose/Rate Route Frequency Ordered Stop   08/23/19 1500  cefTRIAXone (ROCEPHIN) 1 g in sodium chloride 0.9 % 100 mL IVPB     1 g 200 mL/hr over 30 Minutes Intravenous Every 24 hours 08/23/19 1342  Subjective: Today, patient was seen and examined at bedside.  Complains of mild dysuria.  Denies any chest pain, palpitation, shortness of breath.  Had 101F temperature yesterday.  Objective: Vitals:   08/24/19 0954 08/24/19 1000  BP: (!) 133/58   Pulse: 91   Resp:  (!) 23  Temp: 98.4 F (36.9 C)   SpO2: 96%     Intake/Output  Summary (Last 24 hours) at 08/24/2019 1302 Last data filed at 08/24/2019 0941 Gross per 24 hour  Intake 2619.2 ml  Output 700 ml  Net 1919.2 ml   Filed Weights   08/22/19 1136 08/22/19 1715 08/24/19 0420  Weight: 83.9 kg 86.6 kg 83 kg   Body mass index is 32.41 kg/m.   Physical Exam: GENERAL: Patient is alert awake and oriented. Not in obvious distress, obese HENT: No scleral pallor or icterus. Pupils equally reactive to light. Oral mucosa is moist NECK: is supple, no gross swelling noted. CHEST: Clear to auscultation. No crackles or wheezes.  Diminished breath sounds bilaterally. CVS: S1 and S2 heard, no murmur. Regular rate and rhythm.  ABDOMEN: Soft, non-tender, bowel sounds are present.  Obese abdomen EXTREMITIES: No edema. CNS: Cranial nerves are intact. No focal motor deficits. SKIN: warm and dry without rashes.  Data Review: I have personally reviewed the following laboratory data and studies,  CBC: Recent Labs  Lab 08/22/19 1215 08/23/19 0022 08/24/19 0520  WBC 18.6* 16.5* 11.8*  HGB 12.8 12.0 10.5*  HCT 37.7 37.0 33.7*  MCV 84.5 87.3 90.1  PLT 273 269 [id number]   Basic Metabolic Panel: Recent Labs  Lab 08/22/19 1215 08/23/19 0022 08/24/19 0520  NA 134* 136 139  K 3.0* 3.8 4.5  CL 94* 101 108  CO2 26 22 23   GLUCOSE 118* 103* 94  BUN 19 15 13   CREATININE 1.17* 1.08* 0.91  CALCIUM 8.6* 8.1* 7.9*  MG  --  2.1 2.1   Liver Function Tests: Recent Labs  Lab 08/22/19 1215 08/23/19 0022  AST 17 21  ALT 18 18  ALKPHOS 75 71  BILITOT 1.1 1.0  PROT 7.7 7.1  ALBUMIN 3.3* 3.0*   Recent Labs  Lab 08/22/19 1215  LIPASE 32   No results for input(s): AMMONIA in the last 168 hours. Cardiac Enzymes: No results for input(s): CKTOTAL, CKMB, CKMBINDEX, TROPONINI in the last 168 hours. BNP (last 3 results) No results for input(s): BNP in the last 8760 hours.  ProBNP (last 3 results) No results for input(s): PROBNP in the last 8760 hours.  CBG: No results for  input(s): GLUCAP in the last 168 hours. Recent Results (from the past 240 hour(s))  SARS CORONAVIRUS 2 (TAT 6-24 HRS) Nasopharyngeal Nasopharyngeal Swab     Status: None   Collection Time: 08/22/19  4:52 PM   Specimen: Nasopharyngeal Swab  Result Value Ref Range Status   SARS Coronavirus 2 NEGATIVE NEGATIVE Final    Comment: (NOTE) SARS-CoV-2 target nucleic acids are NOT DETECTED. The SARS-CoV-2 RNA is generally detectable in upper and lower respiratory specimens during the acute phase of infection. Negative results do not preclude SARS-CoV-2 infection, do not rule out co-infections with other pathogens, and should not be used as the sole basis for treatment or other patient management decisions. Negative results must be combined with clinical observations, patient history, and epidemiological information. The expected result is Negative. Fact Sheet for Patients: SugarRoll.be Fact Sheet for Healthcare Providers: https://www.woods-mathews.com/ This test is not yet approved or cleared by the [place] FDA and  has  been authorized for detection and/or diagnosis of SARS-CoV-2 by FDA under an Emergency Use Authorization (EUA). This EUA will remain  in effect (meaning this test can be used) for the duration of the COVID-19 declaration under Section 56 4(b)(1) of the Act, 21 U.S.C. section 360bbb-3(b)(1), unless the authorization is terminated or revoked sooner. Performed at [facility], [place] [address] [place], [place] [address]   C Difficile Quick Screen w PCR reflex     Status: None   Collection Time: 08/23/19 12:00 PM   Specimen: STOOL  Result Value Ref Range Status   C Diff antigen NEGATIVE NEGATIVE Final   C Diff toxin NEGATIVE NEGATIVE Final   C Diff interpretation No C. difficile detected.  Final    Comment: Performed at [facility], [place] [address], [place], [place] [address]  Culture, blood (Routine X 2) w  Reflex to ID Panel     Status: None (Preliminary result)   Collection Time: 08/23/19  2:18 PM   Specimen: BLOOD  Result Value Ref Range Status   Specimen Description   Final    BLOOD RIGHT HAND Performed at [facility] [address] [place], [place] [address]    Special Requests   Final    BOTTLES DRAWN AEROBIC ONLY Blood Culture adequate volume Performed at [facility] [address], [place], [place] [address]    Culture   Final    NO GROWTH < 12 HOURS Performed at [facility] [address], [place], [place] [address]    Report Status PENDING  Incomplete  Culture, blood (Routine X 2) w Reflex to ID Panel     Status: None (Preliminary result)   Collection Time: 08/23/19  3:09 PM   Specimen: BLOOD RIGHT HAND  Result Value Ref Range Status   Specimen Description   Final    BLOOD RIGHT HAND Performed at [facility] [address], [place], [place] [address]    Special Requests   Final    BOTTLES DRAWN AEROBIC ONLY Blood Culture results may not be optimal due to an inadequate volume of blood received in culture bottles Performed at [facility] [address], [place], [place] [address]    Culture   Final    NO GROWTH < 12 HOURS Performed at [facility] [address], [place], [place] [address]    Report Status PENDING  Incomplete     Studies: CT ABDOMEN PELVIS W CONTRAST  Result Date: 08/22/2019 CLINICAL DATA:  Abdominal pain with vomiting and diarrhea. EXAM: CT ABDOMEN AND PELVIS WITH CONTRAST TECHNIQUE: Multidetector CT imaging of the abdomen and pelvis was performed using the standard protocol following bolus administration of intravenous contrast. CONTRAST:  1100mL OMNIPAQUE IOHEXOL 300 MG/ML  SOLN COMPARISON:  Abdominopelvic CT 04/25/2019. FINDINGS: Lower chest: Interval improved aeration of the lung bases with mild residual linear scarring or atelectasis bilaterally. No  pleural or pericardial effusion. Aortic atherosclerosis noted. Hepatobiliary: New complete thrombosis of the left portal vein. The right and main portal veins are patent. Mildly asymmetric hepatic enhancement attributed to the portal vein thrombosis. No focal hepatic lesions identified. No evidence of gallstones, gallbladder wall thickening or biliary dilatation. Pancreas: Unremarkable. No pancreatic ductal dilatation or surrounding inflammatory changes. Spleen: Normal in size without focal abnormality. Adrenals/Urinary Tract: Both adrenal glands appear normal. Stable small right renal cysts. No evidence of urinary tract calculus or hydronephrosis. The bladder appears normal. Stomach/Bowel: The  stomach, small bowel and appendix appear normal. There are extensive diverticular changes throughout the colon with mild sigmoid colon wall thickening, similar to previous study. No surrounding inflammatory changes to suggest active diverticulitis. No evidence of bowel obstruction or perforation. Vascular/Lymphatic: There are no enlarged abdominal or pelvic lymph nodes. Small lymph nodes in the porta hepatis are slightly more prominent, although likely reactive. As above, thrombosis of the left portal vein. The right and main portal veins, superior mesenteric and splenic veins are patent. The IVC and iliac veins appear patent. Aortic and branch vessel atherosclerosis without evidence of large vessel arterial occlusion. Reproductive: Hysterectomy. Stable appearance of the adnexa without suspicious findings. Other: No evidence of abdominal wall mass or hernia. No ascites. Musculoskeletal: No acute or significant osseous findings. Stable lumbar spondylosis. IMPRESSION: 1. 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 No signs of underlying malignancy. 2. Extensive colonic diverticulosis without evidence of active diverticulitis. 3. Aortic Atherosclerosis (ICD10-I70.0). 4. Critical Value/emergent results were called by  telephone at the time of interpretation on 08/22/2019 at 3:15 pm to provider JOSEPH ZAMMIT , who verbally acknowledged these results. Electronically Signed   By: Richardean Sale M.D.   On: 08/22/2019 15:17      Flora Lipps, MD  [facility] 08/24/2019

## 2019-08-25 LAB — CBC
HCT: 34.1 % — ABNORMAL LOW (ref 36.0–46.0)
Hemoglobin: 10.7 g/dL — ABNORMAL LOW (ref 12.0–15.0)
MCH: 28.1 pg (ref 26.0–34.0)
MCHC: 31.4 g/dL (ref 30.0–36.0)
MCV: 89.5 fL (ref 80.0–100.0)
Platelets: 265 10*3/uL (ref 150–400)
RBC: 3.81 MIL/uL — ABNORMAL LOW (ref 3.87–5.11)
RDW: 14.1 % (ref 11.5–15.5)
WBC: 8.8 10*3/uL (ref 4.0–10.5)
nRBC: 0 % (ref 0.0–0.2)

## 2019-08-25 LAB — URINE CULTURE: Culture: NO GROWTH

## 2019-08-25 LAB — BASIC METABOLIC PANEL
Anion gap: 11 (ref 5–15)
BUN: 9 mg/dL (ref 8–23)
CO2: 19 mmol/L — ABNORMAL LOW (ref 22–32)
Calcium: 7.8 mg/dL — ABNORMAL LOW (ref 8.9–10.3)
Chloride: 107 mmol/L (ref 98–111)
Creatinine, Ser: 0.73 mg/dL (ref 0.44–1.00)
GFR calc Af Amer: >60 mL/min (ref >60)
GFR calc non Af Amer: >60 mL/min (ref >60)
Glucose, Bld: 86 mg/dL (ref 70–99)
Potassium: 3.7 mmol/L (ref 3.5–5.1)
Sodium: 137 mmol/L (ref 135–145)

## 2019-08-25 LAB — MAGNESIUM: Magnesium: 2 mg/dL (ref 1.7–2.4)

## 2019-08-25 MED ORDER — APIXABAN (ELIQUIS) VTE STARTER PACK (10MG AND 5MG): ORAL_TABLET | ORAL | 0 refills | Status: DC

## 2019-08-25 NOTE — Progress Notes (Signed)
Occupational Therapy Treatment Patient Details Name: PHYLECIA KUBILUS MRN: [id number] DOB: 1945-05-15 Today's Date: 08/25/2019    History of present illness 74 yo female admitted with portal vein thrombosis, gastroenteritis.   OT comments  Patient progressing well with self care, close to baseline. Supervision level for safety with mild balance deficits observed however no physical assist needed for sink side bathing/grooming or functional transfers. Patient reports she is supposed to go home today and feels ready.   Follow Up Recommendations  Supervision - Intermittent    Equipment Recommendations  3 in 1 bedside commode       Precautions / Restrictions Precautions Precautions: Fall Restrictions Weight Bearing Restrictions: No       Mobility Bed Mobility Overal bed mobility: Modified Independent             General bed mobility comments: HOB elevated  Transfers Overall transfer level: Needs assistance Equipment used: None Transfers: Sit to/from Stand Sit to Stand: Supervision         General transfer comment: mild balance deficits, no physical assist required    Balance Overall balance assessment: Mild deficits observed, not formally tested                                         ADL either performed or assessed with clinical judgement   ADL Overall ADL's : Needs assistance/impaired     Grooming: Wash/dry face;Wash/dry hands;Applying deodorant;Supervision/safety;Standing   Upper Body Bathing: Supervision/ safety;Standing   Lower Body Bathing: Supervison/ safety;Sit to/from stand Lower Body Bathing Details (indicate cue type and reason): wash upper thighs and peri area         Toilet Transfer: Supervision/safety;Ambulation Toilet Transfer Details (indicate cue type and reason): simulated with functional transfer to toilet, no loss of balance noted         Functional mobility during ADLs: Supervision/safety                  Cognition Arousal/Alertness: Awake/alert Behavior During Therapy: WFL for tasks assessed/performed Overall Cognitive Status: Within Functional Limits for tasks assessed                                                     Pertinent Vitals/ Pain       Pain Assessment: No/denies pain         Frequency  Min 2X/week        Progress Toward Goals  OT Goals(current goals can now be found in the care plan section)  Progress towards OT goals: Progressing toward goals  Acute Rehab OT Goals Patient Stated Goal: diarrhea resolved OT Goal Formulation: With patient Time For Goal Achievement: 08/30/19 ADL Goals Pt Will Perform Grooming: Independently;standing Pt Will Perform Lower Body Dressing: with modified independence Pt Will Transfer to Toilet: with modified independence;regular height toilet Pt Will Perform Toileting - Clothing Manipulation and hygiene: with modified independence;sit to/from stand  Plan Discharge plan needs to be updated       AM-PAC OT "6 Clicks" Daily Activity     Outcome Measure   Help from another person eating meals?: None Help from another person taking care of personal grooming?: A Little Help from another person toileting, which includes using toliet, bedpan, or urinal?: A Little  Help from another person bathing (including washing, rinsing, drying)?: A Little Help from another person to put on and taking off regular upper body clothing?: A Little Help from another person to put on and taking off regular lower body clothing?: A Little 6 Click Score: 19    End of Session  OT Visit Diagnosis: Unsteadiness on feet (R26.81);Muscle weakness (generalized) (M62.81)   Activity Tolerance Patient tolerated treatment well   Patient Left in chair;with call bell/phone within reach;with chair alarm set           Time: [id number] OT Time Calculation (min): 17 min  Charges: OT General Charges $OT Visit: 1 Visit OT Treatments $Self  Care/Home Management : 8-22 mins  Delbert Phenix OT Pager: [facility] 08/25/2019, 9:15 AM

## 2019-08-25 NOTE — Progress Notes (Signed)
Physical Therapy Treatment Patient Details Name: GENIVIEVE CALLIER MRN: [id number] DOB: 02/13/1946 Today's Date: 08/25/2019    History of Present Illness 74 yo female admitted with portal vein thrombosis, gastroenteritis.    PT Comments    Pt reports feeling better on today. She politely declines PT f/u. She plans to d/c home on today.    Follow Up Recommendations  No PT follow up(pt politely declined HHPT f/u on today)     Equipment Recommendations  None recommended by PT    Recommendations for Other Services       Precautions / Restrictions Precautions Precautions: Fall Restrictions Weight Bearing Restrictions: No    Mobility  Bed Mobility Overal bed mobility: Modified Independent             General bed mobility comments: oob in recliner  Transfers Overall transfer level: Modified independent Equipment used: None Transfers: Sit to/from Stand Sit to Stand: Modified independent (Device/Increase time)         General transfer comment: mild balance deficits, no physical assist required  Ambulation/Gait Ambulation/Gait assistance: Supervision Gait Distance (Feet): 100 Feet Assistive device: None Gait Pattern/deviations: Step-through pattern;Decreased stride length     General Gait Details: mildly unsteady but no lob. pt denied dizziness. she tolerate distance well.   Stairs             Wheelchair Mobility    Modified Rankin (Stroke Patients Only)       Balance Overall balance assessment: Mild deficits observed, not formally tested                                          Cognition Arousal/Alertness: Awake/alert Behavior During Therapy: WFL for tasks assessed/performed Overall Cognitive Status: Within Functional Limits for tasks assessed                                        Exercises      General Comments        Pertinent Vitals/Pain Pain Assessment: No/denies pain    Home Living                       Prior Function            PT Goals (current goals can now be found in the care plan section) Acute Rehab PT Goals Patient Stated Goal: diarrhea resolved Progress towards PT goals: Progressing toward goals    Frequency    Min 3X/week      PT Plan Discharge plan needs to be updated    Co-evaluation              AM-PAC PT "6 Clicks" Mobility   Outcome Measure  Help needed turning from your back to your side while in a flat bed without using bedrails?: None Help needed moving from lying on your back to sitting on the side of a flat bed without using bedrails?: None Help needed moving to and from a bed to a chair (including a wheelchair)?: None Help needed standing up from a chair using your arms (e.g., wheelchair or bedside chair)?: None Help needed to walk in hospital room?: A Little Help needed climbing 3-5 steps with a railing? : A Little 6 Click Score: 22    End of Session   Activity  Tolerance: Patient tolerated treatment well Patient left: in chair;with call bell/phone within reach;with chair alarm set   PT Visit Diagnosis: Muscle weakness (generalized) (M62.81);Unsteadiness on feet (R26.81)     Time: [id number] PT Time Calculation (min) (ACUTE ONLY): 8 min  Charges:  $Gait Training: 8-22 mins                         Doreatha Massed, PT Acute Rehabilitation

## 2019-08-25 NOTE — Discharge Summary (Signed)
Physician Discharge Summary  Sylvia Gutierrez [id number] DOB: 22-Mar-1946 DOA: 08/22/2019  PCP: Rita Ohara, MD  Admit date: 08/22/2019 Discharge date: 08/25/2019  Admitted From: Home  Discharge disposition: Home  Recommendations for Outpatient Follow-Up:   . Follow up with your primary care provider in one week.  . Check CBC, BMP, INR in the next visit. . Follow-up with oncology in 1 to 2 months to discuss about portal vein thrombosis.   Discharge Diagnosis:   Principal Problem:   Portal vein thrombosis Active Problems:   Essential hypertension, benign   Pure hypercholesterolemia   Obesity (BMI 30-39.9)   Discharge Condition: Improved.  Diet recommendation: Low sodium, heart healthy.    Wound care: None.  Code status: Full.   History of Present Illness:   [name] a 74 y.o.femalewith past medical history of hypertension, hyperlipidemia presented to hospital with nausea, vomiting and profuse diarrhea for the last 24 hours with abdominal discomfort.Patient stated that she has been having intermittent abdominal pain for the last 1 month or so.  24 hours prior to presentation, patient  had 4-5 bowel movements without any blood, loose in consistency with nausea and vomiting. ED Course:In the ED, she was noted to bevolume depleted andhypokalemic. Patientunderwent CT scan of the abdomen which showed portal vein thrombosis.Hemato-oncology was consulted from the ED who recommended anticoagulationandoutpatient follow-up.In the ED, patient received IV potassium 10 mEq, 40 p.o. potassium, and heparin drip and the patient was considered for observation in hospital.   Hospital Course:   Following conditions were addressed during hospitalization as listed below,  Acute portal vein thrombosis Unknown etiology.  Could be secondary to gastroenteritis.  Patient had been having some intermittent abdominal pain, nausea vomiting.  CT scan does not show any evidence  of malignancy.  Dr Alen Blew was notified from the ED.  He recommended anticoagulation and outpatient follow-up.  Patient was initially put on heparin drip which was subsequently changed to Eliquis on discharge.  Effects of anticoagulation and risk of bleeding was thoroughly explained to the patient.  She did have high INR on presentation of unknown etiology.  This will need to be followed up as outpatient.  Low-grade fever could be from thrombosis as well.  Acute gastroenteritis nausea vomiting diarrhea on presentation..    Patient did have mild fever prior to discharge but significantly improved clinically and wished to go home.  Possible viral gastroenteritis..  Received IV fluids and supportive care with improvement in her symptoms..  Urine culture was negative, blood culture was negative as well.  C. difficile was negative.  TSH within normal limits.  COVID-19 was negative.    Volume depletion secondary to nausea vomiting and diarrhea.    Improved with IV fluids.  Significant hypokalemia.    On presentation.  Replenished and improved.    Mild hyponatremia.  Secondary to volume depletion.  Resolved  Leukocytosis.  Could be reactive/ secondary to volume depletion.    Resolved  Elevated creatinine level, mild acute kidney injury.    Improved and resolved with IV fluids.  Essential hypertension.   Patient was resumed on home atenolol and lisinopril.  Hyperlipidemia.  On pravastatin at home.   Disposition.  At this time, patient is stable for disposition with outpatient follow-up with primary care physician and oncology   Medical Consultants:    None.  Procedures:    None Subjective:   Today, patient feels better.  Wishes to go home.  Denies any nausea vomiting or abdominal pain or diarrhea.  No shortness of breath or chest pain.  Discharge Exam:   Vitals:   08/25/19 0641 08/25/19 1300  BP: 134/65 (!) 155/80  Pulse: 71 77  Resp: 20 18  Temp: 98.2 F (36.8 C) 99.1 F (37.3  C)  SpO2: 96% 96%   Vitals:   08/25/19 0109 08/25/19 0641 08/25/19 0644 08/25/19 1300  BP:  134/65  (!) 155/80  Pulse:  71  77  Resp: 20 20  18   Temp: 97.9 F (36.6 C) 98.2 F (36.8 C)  99.1 F (37.3 C)  TempSrc: Oral Oral  Oral  SpO2:  96%  96%  Weight:   89.3 kg   Height:        General: Alert awake, not in obvious distress, obese HENT: pupils equally reacting to light,  No scleral pallor or icterus noted. Oral mucosa is moist.  Chest:  Clear breath sounds.  Diminished breath sounds bilaterally. No crackles or wheezes.  CVS: S1 &S2 heard. No murmur.  Regular rate and rhythm. Abdomen: Soft, nontender, nondistended.  Bowel sounds are heard.  Obese abdomen Extremities: No cyanosis, clubbing or edema.  Peripheral pulses are palpable. Psych: Alert, awake and oriented, normal mood CNS:  No cranial nerve deficits.  Power equal in all extremities.   Skin: Warm and dry.  No rashes noted.  The results of significant diagnostics from this hospitalization (including imaging, microbiology, ancillary and laboratory) are listed below for reference.     Diagnostic Studies:   CT ABDOMEN PELVIS W CONTRAST  Result Date: 08/22/2019 CLINICAL DATA:  Abdominal pain with vomiting and diarrhea. EXAM: CT ABDOMEN AND PELVIS WITH CONTRAST TECHNIQUE: Multidetector CT imaging of the abdomen and pelvis was performed using the standard protocol following bolus administration of intravenous contrast. CONTRAST:  132mL OMNIPAQUE IOHEXOL 300 MG/ML  SOLN COMPARISON:  Abdominopelvic CT 04/25/2019. FINDINGS: Lower chest: Interval improved aeration of the lung bases with mild residual linear scarring or atelectasis bilaterally. No pleural or pericardial effusion. Aortic atherosclerosis noted. Hepatobiliary: New complete thrombosis of the left portal vein. The right and main portal veins are patent. Mildly asymmetric hepatic enhancement attributed to the portal vein thrombosis. No focal hepatic lesions identified. No  evidence of gallstones, gallbladder wall thickening or biliary dilatation. Pancreas: Unremarkable. No pancreatic ductal dilatation or surrounding inflammatory changes. Spleen: Normal in size without focal abnormality. Adrenals/Urinary Tract: Both adrenal glands appear normal. Stable small right renal cysts. No evidence of urinary tract calculus or hydronephrosis. The bladder appears normal. Stomach/Bowel: The stomach, small bowel and appendix appear normal. There are extensive diverticular changes throughout the colon with mild sigmoid colon wall thickening, similar to previous study. No surrounding inflammatory changes to suggest active diverticulitis. No evidence of bowel obstruction or perforation. Vascular/Lymphatic: There are no enlarged abdominal or pelvic lymph nodes. Small lymph nodes in the porta hepatis are slightly more prominent, although likely reactive. As above, thrombosis of the left portal vein. The right and main portal veins, superior mesenteric and splenic veins are patent. The IVC and iliac veins appear patent. Aortic and branch vessel atherosclerosis without evidence of large vessel arterial occlusion. Reproductive: Hysterectomy. Stable appearance of the adnexa without suspicious findings. Other: No evidence of abdominal wall mass or hernia. No ascites. Musculoskeletal: No acute or significant osseous findings. Stable lumbar spondylosis. IMPRESSION: 1. 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 No signs of underlying malignancy. 2. Extensive colonic diverticulosis without evidence of active diverticulitis. 3. Aortic Atherosclerosis (ICD10-I70.0). 4. Critical Value/emergent results were called by telephone at the time  of interpretation on 08/22/2019 at 3:15 pm to provider JOSEPH ZAMMIT , who verbally acknowledged these results. Electronically Signed   By: Richardean Sale M.D.   On: 08/22/2019 15:17     Labs:   Basic Metabolic Panel: Recent Labs  Lab 08/22/19 1215 08/22/19 1215  08/23/19 0022 08/23/19 0022 08/24/19 0520 08/25/19 0506  NA 134*  --  136  --  139 137  K 3.0*   < > 3.8   < > 4.5 3.7  CL 94*  --  101  --  108 107  CO2 26  --  22  --  23 19*  GLUCOSE 118*  --  103*  --  94 86  BUN 19  --  15  --  13 9  CREATININE 1.17*  --  1.08*  --  0.91 0.73  CALCIUM 8.6*  --  8.1*  --  7.9* 7.8*  MG  --   --  2.1  --  2.1 2.0   < > = values in this interval not displayed.   GFR Estimated Creatinine Clearance: 65.5 mL/min (by C-G formula based on SCr of 0.73 mg/dL). Liver Function Tests: Recent Labs  Lab 08/22/19 1215 08/23/19 0022  AST 17 21  ALT 18 18  ALKPHOS 75 71  BILITOT 1.1 1.0  PROT 7.7 7.1  ALBUMIN 3.3* 3.0*   Recent Labs  Lab 08/22/19 1215  LIPASE 32   No results for input(s): AMMONIA in the last 168 hours. Coagulation profile Recent Labs  Lab 08/22/19 1540 08/24/19 0520  INR 1.3* 1.7*    CBC: Recent Labs  Lab 08/22/19 1215 08/23/19 0022 08/24/19 0520 08/25/19 0506  WBC 18.6* 16.5* 11.8* 8.8  HGB 12.8 12.0 10.5* 10.7*  HCT 37.7 37.0 33.7* 34.1*  MCV 84.5 87.3 90.1 89.5  PLT 273 269 252 265   Cardiac Enzymes: No results for input(s): CKTOTAL, CKMB, CKMBINDEX, TROPONINI in the last 168 hours. BNP: Invalid input(s): POCBNP CBG: No results for input(s): GLUCAP in the last 168 hours. D-Dimer No results for input(s): DDIMER in the last 72 hours. Hgb A1c No results for input(s): HGBA1C in the last 72 hours. Lipid Profile No results for input(s): CHOL, HDL, LDLCALC, TRIG, CHOLHDL, LDLDIRECT in the last 72 hours. Thyroid function studies Recent Labs    08/22/19 1725  TSH 3.267   Anemia work up No results for input(s): VITAMINB12, FOLATE, FERRITIN, TIBC, IRON, RETICCTPCT in the last 72 hours. Microbiology Recent Results (from the past 240 hour(s))  SARS CORONAVIRUS 2 (TAT 6-24 HRS) Nasopharyngeal Nasopharyngeal Swab     Status: None   Collection Time: 08/22/19  4:52 PM   Specimen: Nasopharyngeal Swab  Result  Value Ref Range Status   SARS Coronavirus 2 NEGATIVE NEGATIVE Final    Comment: (NOTE) SARS-CoV-2 target nucleic acids are NOT DETECTED. The SARS-CoV-2 RNA is generally detectable in upper and lower respiratory specimens during the acute phase of infection. Negative results do not preclude SARS-CoV-2 infection, do not rule out co-infections with other pathogens, and should not be used as the sole basis for treatment or other patient management decisions. Negative results must be combined with clinical observations, patient history, and epidemiological information. The expected result is Negative. Fact Sheet for Patients: SugarRoll.be Fact Sheet for Healthcare Providers: https://www.woods-mathews.com/ This test is not yet approved or cleared by the [place] FDA and  has been authorized for detection and/or diagnosis of SARS-CoV-2 by FDA under an Emergency Use Authorization (EUA). This EUA will remain  in effect (meaning this test can be used) for the duration of the COVID-19 declaration under Section 56 4(b)(1) of the Act, 21 U.S.C. section 360bbb-3(b)(1), unless the authorization is terminated or revoked sooner. Performed at [facility], [place] [address] [place], [place] [address]   C Difficile Quick Screen w PCR reflex     Status: None   Collection Time: 08/23/19 12:00 PM   Specimen: STOOL  Result Value Ref Range Status   C Diff antigen NEGATIVE NEGATIVE Final   C Diff toxin NEGATIVE NEGATIVE Final   C Diff interpretation No C. difficile detected.  Final    Comment: Performed at [facility], [place] [address], [place], [place] [address]  Culture, Urine     Status: None   Collection Time: 08/23/19  1:43 PM   Specimen: Urine, Clean Catch  Result Value Ref Range Status   Specimen Description   Final    URINE, CLEAN CATCH Performed at [facility], [place] [address] [place], [place] [address]      Special Requests   Final    NONE Performed at [facility], [place] [address], [place], [place] [address]    Culture   Final    NO GROWTH Performed at [facility], [place] [address], [place], [place] [address]    Report Status 08/25/2019 FINAL  Final  Culture, blood (Routine X 2) w Reflex to ID Panel     Status: None (Preliminary result)   Collection Time: 08/23/19  2:18 PM   Specimen: BLOOD  Result Value Ref Range Status   Specimen Description BLOOD RIGHT HAND  Final   Special Requests   Final    BOTTLES DRAWN AEROBIC ONLY Blood Culture adequate volume Performed at [place] [address], [place], [place] [address]    Culture NO GROWTH 2 DAYS  Final   Report Status PENDING  Incomplete  Culture, blood (Routine X 2) w Reflex to ID Panel     Status: None (Preliminary result)   Collection Time: 08/23/19  3:09 PM   Specimen: BLOOD RIGHT HAND  Result Value Ref Range Status   Specimen Description BLOOD RIGHT HAND  Final   Special Requests   Final    BOTTLES DRAWN AEROBIC ONLY Blood Culture results may not be optimal due to an inadequate volume of blood received in culture bottles Performed at [facility], [place] [address], [place], [place] [address]    Culture NO GROWTH 2 DAYS  Final   Report Status PENDING  Incomplete     Discharge Instructions:   Discharge Instructions    Call MD for:   Complete by: As directed    Worsening symptoms,   Diet - low sodium heart healthy   Complete by: As directed    Discharge instructions   Complete by: As directed    Please follow-up with your primary care physician in 1 week.  Please take precautions on blood thinners.  If you notice any bleeding, do not take blood thinner and please seek medical attention.  You will have to follow-up with oncologist in 1 to 2 months to discuss about your blood clot.  Your aspirin has been discontinued at this time.   Increase activity slowly    Complete by: As directed      Allergies as of 08/25/2019   No Known Allergies     Medication List    STOP taking these medications  aspirin 81 MG tablet     TAKE these medications   Apixaban Starter Pack (10mg  and 5mg ) Commonly known as: ELIQUIS STARTER PACK Take as directed on package: start with two-5mg  tablets twice daily for 7 days. On day 8, switch to one-5mg  tablet twice daily.   atenolol 25 MG tablet Commonly known as: TENORMIN TAKE 1 TABLET(25 MG) BY MOUTH DAILY What changed: See the new instructions.   cholecalciferol 1000 units tablet Commonly known as: VITAMIN D Take 1,000 Units by mouth daily.   fish oil-omega-3 fatty acids 1000 MG capsule Take 2 g by mouth daily.   lisinopril-hydrochlorothiazide 20-12.5 MG tablet Commonly known as: ZESTORETIC TAKE 1 TABLET BY MOUTH DAILY   pravastatin 40 MG tablet Commonly known as: PRAVACHOL TAKE 1 TABLET(40 MG) BY MOUTH DAILY What changed: See the new instructions.      Follow-up Information    Rita Ohara, MD. Schedule an appointment as soon as possible for a visit in 1 week(s).   Specialty: Family Medicine Why: Regular checkup and blood work Contact information: [place] [address] [phone]        Wyatt Portela, MD. Schedule an appointment as soon as possible for a visit in 2 month(s).   Specialty: Oncology Why: To discuss about blood clot. Contact information: [place] [address] [phone]            Time coordinating discharge: 39 minutes  Signed:  Nayelly Laughman  [facility] 08/25/2019, 1:28 PM

## 2019-08-25 NOTE — Progress Notes (Signed)
Patient's IV removed.  Site WNL.  AVS reviewed with patient.  Patient verbalized understanding of discharge instructions of physician follow-up, medications.  Patient transported by NT via wheelchair to main entrance at discharge.  Patient stable at time of discharge.

## 2019-08-26 ENCOUNTER — Telehealth: Payer: Self-pay

## 2019-08-26 NOTE — Telephone Encounter (Signed)
I called the pt. Because she was on my TOC report and was due to f/u her with in 1 week. I went over the pts. Medication changes and her medicines were reconciled. The pt. Has been scheduled for her hospital f/u on 08/30/19 and needs to recheck CBC and BMP per hospital notes.

## 2019-08-28 LAB — CULTURE, BLOOD (ROUTINE X 2)
Culture: NO GROWTH
Culture: NO GROWTH
Special Requests: ADEQUATE

## 2019-08-29 NOTE — Progress Notes (Signed)
Chief Complaint  Patient presents with  . Hospitalization Follow-up    follow up. Does not have a follow up scheduled with hem/oncology. Has not had any COVID vaccines.     Patient presents for hospital follow-up. She was admitted 4/11-4/14.  She went to the hospital with nausea, vomiting, diarrhea, dehydration, and was diagnosed with portal vein thrombosis.  She was hydrated, anticoagulated, and discharged on Eliquis. She presents for recheck and due for follow-up labs. Her aspirin was stopped when put on eliquis, as was the fish oil.  She is taking Eliquis as directed--to take 85m BID for 7 days (which she is currently taking), then decrease dose to 589mBID. She is tolerating without side effects, and denies any bleeding.  Her GI symptoms have resolved (resolved prior to hospital discharge).  Bowels are now normal.  Occasionally gets a sharp, short-lived pain in her abdomen, which she attributes to gas.  She had hypokalemia in the hospital, which was replaced. Due for recheck today. Denies muscle cramps.  Hypertension:  She is back on her usual home anti-hypertensive regimen of atenolol 2580mnd lisinopril HCTZ. Hyperlipidemia: she continues to take pravastatin. They had her stop fish oil (when started on blood thinner).   PMH, PSH, SH reviewed  Outpatient Encounter Medications as of 08/30/2019  Medication Sig Note  . APIXABAN (ELIQUIS) VTE STARTER PACK (10MG AND 5MG) Take as directed on package: start with two-5mg32mblets twice daily for 7 days. On day 8, switch to one-5mg 32mlet twice daily. 08/30/2019: On Day 4 10mg 66m . atenolol (TENORMIN) 25 MG tablet TAKE 1 TABLET(25 MG) BY MOUTH DAILY (Patient taking differently: Take 25 mg by mouth daily. )   . cholecalciferol (VITAMIN D) 1000 UNITS tablet Take 1,000 Units by mouth daily.   . lisiMarland Kitchenopril-hydrochlorothiazide (ZESTORETIC) 20-12.5 MG tablet TAKE 1 TABLET BY MOUTH DAILY   . pravastatin (PRAVACHOL) 40 MG tablet TAKE 1 TABLET(40 MG) BY  MOUTH DAILY (Patient taking differently: Take 40 mg by mouth daily. )   . [DISCONTINUED] fish oil-omega-3 fatty acids 1000 MG capsule Take 2 g by mouth daily.     No facility-administered encounter medications on file as of 08/30/2019.   No Known Allergies   ROS: no fever, chills, URI symptoms, headaches, dizziness, chest pain, shortness of breath. She denies urinary complaints. Denies bleeding, bruising, rash. Vomiting and diarrhea resolved. See HPI.   PHYSICAL EXAM:  BP 116/70   Pulse 68   Temp (!) 96.3 F (35.7 C) (Other (Comment))   Ht _0  (1.549 m)   Wt 196 lb 6.4 oz (89.1 kg)   BMI 37.11 kg/m   Wt Readings from Last 3 Encounters:  08/30/19 196 lb 6.4 oz (89.1 kg)  08/25/19 196 lb 14.4 oz (89.3 kg)  06/08/19 191 lb 6.4 oz (86.8 kg)   Pleasant, well appearing female in no distress. She is in good spirits. HEENT: conjunctiva and sclera are clear, EOMI Neck: no lymphadenopathy, thyromegaly or mass Heart: regular rate and rhythm Lungs: clear bilaterally Back: no CVA or spinal tenderness Abdomen: soft, nontender, no organomegaly or mass Extremities: trace edema at L ankle only Psych: normal mood, affect, hygiene and grooming Neuro: alert and oriented, normal gait.   ASSESSMENT/PLAN:  Portal vein thrombosis - Currently without any symptoms; tolerating blood thinners without problems. Refer to hem/onc for f/u - Plan: Ambulatory referral to Hematology  Current use of anticoagulant therapy - reviewed proper dosing (to decrease to 5mg BI69mn day 8); no evidence of bleeding  or complication. - Plan: CBC with Differential/Platelet  Essential hypertension, benign - well controlled on current regimen - Plan: Basic metabolic panel  Hypokalemia - while in hospital (likely related to vomiting and diarrhea); due fo recheck - Plan: Basic metabolic panel  Vaccine counseling - Counseled in detail about COVID vaccine, and recommended that she get the vaccine.   CBC, b-met  F/u  as scheduled in June for CPE, sooner prn

## 2019-08-30 ENCOUNTER — Other Ambulatory Visit: Payer: Self-pay

## 2019-08-30 ENCOUNTER — Encounter: Payer: Self-pay | Admitting: Family Medicine

## 2019-08-30 ENCOUNTER — Telehealth: Payer: Self-pay | Admitting: Oncology

## 2019-08-30 ENCOUNTER — Ambulatory Visit (INDEPENDENT_AMBULATORY_CARE_PROVIDER_SITE_OTHER): Payer: Medicare Other | Admitting: Family Medicine

## 2019-08-30 VITALS — BP 116/70 | HR 68 | Temp 96.3°F | Ht 61.0 in | Wt 196.4 lb

## 2019-08-30 DIAGNOSIS — E876 Hypokalemia: Secondary | ICD-10-CM | POA: Diagnosis not present

## 2019-08-30 DIAGNOSIS — I10 Essential (primary) hypertension: Secondary | ICD-10-CM

## 2019-08-30 DIAGNOSIS — Z7189 Other specified counseling: Secondary | ICD-10-CM | POA: Diagnosis not present

## 2019-08-30 DIAGNOSIS — I81 Portal vein thrombosis: Secondary | ICD-10-CM | POA: Diagnosis not present

## 2019-08-30 DIAGNOSIS — Z7901 Long term (current) use of anticoagulants: Secondary | ICD-10-CM

## 2019-08-30 DIAGNOSIS — Z7185 Encounter for immunization safety counseling: Secondary | ICD-10-CM

## 2019-08-30 NOTE — Telephone Encounter (Signed)
Received an urgent referral from [place] for portal vein thrombosis. Sylvia Gutierrez has been cld and scheduled to see Dr. Alen Blew on 4/21 at 11am. Pt aware to arrive 15 minutes early.

## 2019-08-30 NOTE — Patient Instructions (Signed)
Continue your current medications. Decrease the Eliquis dose on day 8 to just 1 tablet (5mg ) twice daily. You may use tylenol products if needed for pain or fever, no other over-the-counter pain medications are recommended to be used with you being on blood thinners.  Contact us if you have any bleeding, dizziness, worsening abdominal pain, fever, or any other symptoms.  I encourage you to get the COVID vaccine.

## 2019-08-31 LAB — BASIC METABOLIC PANEL
BUN/Creatinine Ratio: 12 (ref 12–28)
BUN: 10 mg/dL (ref 8–27)
CO2: 29 mmol/L (ref 20–29)
Calcium: 9.4 mg/dL (ref 8.7–10.3)
Chloride: 98 mmol/L (ref 96–106)
Creatinine, Ser: 0.81 mg/dL (ref 0.57–1.00)
GFR calc Af Amer: 83 mL/min/{1.73_m2} (ref >59)
GFR calc non Af Amer: 72 mL/min/{1.73_m2} (ref >59)
Glucose: 113 mg/dL — ABNORMAL HIGH (ref 65–99)
Potassium: 3.9 mmol/L (ref 3.5–5.2)
Sodium: 141 mmol/L (ref 134–144)

## 2019-08-31 LAB — CBC WITH DIFFERENTIAL/PLATELET
Basophils Absolute: 0 10*3/uL (ref 0.0–0.2)
Basos: 1 %
EOS (ABSOLUTE): 0 10*3/uL (ref 0.0–0.4)
Eos: 1 %
Hematocrit: 34.1 % (ref 34.0–46.6)
Hemoglobin: 11.3 g/dL (ref 11.1–15.9)
Immature Grans (Abs): 0.1 10*3/uL (ref 0.0–0.1)
Immature Granulocytes: 1 %
Lymphocytes Absolute: 1.4 10*3/uL (ref 0.7–3.1)
Lymphs: 21 %
MCH: 28.3 pg (ref 26.6–33.0)
MCHC: 33.1 g/dL (ref 31.5–35.7)
MCV: 85 fL (ref 79–97)
Monocytes Absolute: 0.4 10*3/uL (ref 0.1–0.9)
Monocytes: 6 %
Neutrophils Absolute: 4.6 10*3/uL (ref 1.4–7.0)
Neutrophils: 70 %
Platelets: 443 10*3/uL (ref 150–450)
RBC: 4 x10E6/uL (ref 3.77–5.28)
RDW: 12.7 % (ref 11.7–15.4)
WBC: 6.6 10*3/uL (ref 3.4–10.8)

## 2019-09-01 ENCOUNTER — Inpatient Hospital Stay: Payer: Medicare Other | Attending: Oncology | Admitting: Oncology

## 2019-09-01 ENCOUNTER — Other Ambulatory Visit: Payer: Self-pay

## 2019-09-01 ENCOUNTER — Telehealth: Payer: Self-pay | Admitting: Oncology

## 2019-09-01 DIAGNOSIS — Z87891 Personal history of nicotine dependence: Secondary | ICD-10-CM | POA: Diagnosis not present

## 2019-09-01 DIAGNOSIS — Z8042 Family history of malignant neoplasm of prostate: Secondary | ICD-10-CM | POA: Diagnosis not present

## 2019-09-01 DIAGNOSIS — I81 Portal vein thrombosis: Secondary | ICD-10-CM

## 2019-09-01 DIAGNOSIS — E785 Hyperlipidemia, unspecified: Secondary | ICD-10-CM

## 2019-09-01 DIAGNOSIS — Z833 Family history of diabetes mellitus: Secondary | ICD-10-CM | POA: Diagnosis not present

## 2019-09-01 DIAGNOSIS — Z90710 Acquired absence of both cervix and uterus: Secondary | ICD-10-CM | POA: Diagnosis not present

## 2019-09-01 DIAGNOSIS — Z8249 Family history of ischemic heart disease and other diseases of the circulatory system: Secondary | ICD-10-CM | POA: Diagnosis not present

## 2019-09-01 DIAGNOSIS — Z79899 Other long term (current) drug therapy: Secondary | ICD-10-CM | POA: Diagnosis not present

## 2019-09-01 DIAGNOSIS — I10 Essential (primary) hypertension: Secondary | ICD-10-CM

## 2019-09-01 DIAGNOSIS — Z7901 Long term (current) use of anticoagulants: Secondary | ICD-10-CM

## 2019-09-01 DIAGNOSIS — Z800 Family history of malignant neoplasm of digestive organs: Secondary | ICD-10-CM

## 2019-09-01 DIAGNOSIS — Z801 Family history of malignant neoplasm of trachea, bronchus and lung: Secondary | ICD-10-CM

## 2019-09-01 NOTE — Telephone Encounter (Signed)
Scheduled appt per 4/21 los.  Spoke with pt and she is aware of the appt date and time.

## 2019-09-01 NOTE — Progress Notes (Signed)
Reason for the request: Portal vein thrombosis  HPI: I was asked by Dr. Tomi Bamberger   to evaluate Ms. Milles for the evaluation of portal vein thrombosis.  She is a 74 year old woman with history of diverticulosis and hypertension presented with abdominal pain on August 22, 2019.  At that time she had 24-hour episode of nausea, vomiting with abdominal pain as well as 4-5 loose bowel habits.  She was seen in the emergency department and CT scan at that time showed complete thrombosis of the left portal vein without any sign of any malignancy.  She did have extensive colonic diverticulosis without any diverticulitis.  She was hospitalized for few days and was discharged on August 25, 2019 on Eliquis.  She was referred to me for evaluation regarding recent portal vein thrombosis.  Clinically, she feels well at this time without any residual complaints.  She denies any nausea, vomiting or abdominal pain.  She denies any hematochezia melena or issues related to Eliquis.  She denies any personal or family history of thrombosis including DVT, PE or superficial phlebitis.  She denies any pregnancy complications previously.   She does not report any headaches, blurry vision, syncope or seizures. Does not report any fevers, chills or sweats.  Does not report any cough, wheezing or hemoptysis.  Does not report any chest pain, palpitation, orthopnea or leg edema.  Does not report any nausea, vomiting or abdominal pain.  Does not report any constipation or diarrhea.  Does not report any skeletal complaints.    Does not report frequency, urgency or hematuria.  Does not report any skin rashes or lesions. Does not report any heat or cold intolerance.  Does not report any lymphadenopathy or petechiae.  Does not report any anxiety or depression.  Remaining review of systems is negative.    Past Medical History:  Diagnosis Date  . Anemia   . Colon polyp    colonoscopy 06/2009, due again 2016  . Diverticulosis   . Hemorrhoids    . Hyperlipidemia   . Hypertension   . Onychomycosis   :  Past Surgical History:  Procedure Laterality Date  . ABDOMINAL HYSTERECTOMY     for bleeding/benign  . CARPAL TUNNEL RELEASE Right   . COLONOSCOPY    . POLYPECTOMY    :   Current Outpatient Medications:  .  APIXABAN (ELIQUIS) VTE STARTER PACK (10MG  AND 5MG ), Take as directed on package: start with two-5mg  tablets twice daily for 7 days. On day 8, switch to one-5mg  tablet twice daily., Disp: 1 each, Rfl: 0 .  atenolol (TENORMIN) 25 MG tablet, TAKE 1 TABLET(25 MG) BY MOUTH DAILY (Patient taking differently: Take 25 mg by mouth daily. ), Disp: 90 tablet, Rfl: 1 .  cholecalciferol (VITAMIN D) 1000 UNITS tablet, Take 1,000 Units by mouth daily., Disp: , Rfl:  .  lisinopril-hydrochlorothiazide (ZESTORETIC) 20-12.5 MG tablet, TAKE 1 TABLET BY MOUTH DAILY, Disp: 90 tablet, Rfl: 1 .  pravastatin (PRAVACHOL) 40 MG tablet, TAKE 1 TABLET(40 MG) BY MOUTH DAILY (Patient taking differently: Take 40 mg by mouth daily. ), Disp: 90 tablet, Rfl: 1:  No Known Allergies:  Family History  Problem Relation Age of Onset  . Hypertension Mother   . Cancer Father        colon (21's)  . Colon cancer Father   . Cancer Sister        throat (smoker)  . Esophageal cancer Sister   . Hypertension Brother   . Diabetes Brother   . Hypertension  Brother   . Hypertension Brother   . Cancer Brother        prostate  . Hypertension Daughter   . Breast cancer Neg Hx   . Heart disease Neg Hx   . Colon polyps Neg Hx   . Rectal cancer Neg Hx   . Stomach cancer Neg Hx   :  Social History   Socioeconomic History  . Marital status: Married    Spouse name: Not on file  . Number of children: 4  . Years of education: Not on file  . Highest education level: Not on file  Occupational History  . Occupation: MACHINE OPERATOR--Retired     Employer: [employer]  Tobacco Use  . Smoking status: Former Smoker    Quit date: 05/13/2005    Years since  quitting: 14.3  . Smokeless tobacco: Never Used  Substance and Sexual Activity  . Alcohol use: No  . Drug use: No  . Sexual activity: Not Currently  Other Topics Concern  . Not on file  Social History Narrative   Retired 2011, but works part-time as a Lawyer.  Separated from her husband 2011, now divorced. Went back to her maiden name [place], previously [place]) in 2019. Lives alone.  Has 4 children, all in [place].  7 grandchildren, 4 great grandchildren   Social Determinants of Radio broadcast assistant Strain:   . Difficulty of Paying Living Expenses:   Food Insecurity:   . Worried About Charity fundraiser in the Last Year:   . Arboriculturist in the Last Year:   Transportation Needs:   . Film/video editor (Medical):   Marland Kitchen Lack of Transportation (Non-Medical):   Physical Activity:   . Days of Exercise per Week:   . Minutes of Exercise per Session:   Stress:   . Feeling of Stress :   Social Connections:   . Frequency of Communication with Friends and Family:   . Frequency of Social Gatherings with Friends and Family:   . Attends Religious Services:   . Active Member of Clubs or Organizations:   . Attends Archivist Meetings:   Marland Kitchen Marital Status:   Intimate Partner Violence:   . Fear of Current or Ex-Partner:   . Emotionally Abused:   Marland Kitchen Physically Abused:   . Sexually Abused:   :  Pertinent items are noted in HPI.  Exam: Blood pressure (!) 147/78, pulse 85, temperature 98 F (36.7 C), temperature source Temporal, resp. rate 20, height 5\' 1"  (1.549 m), weight 195 lb 14.4 oz (88.9 kg), SpO2 96 %.  ECOG 1  General appearance: alert and cooperative appeared without distress. Head: atraumatic without any abnormalities. Eyes: conjunctivae/corneas clear. PERRL.  Sclera anicteric. Throat: lips, mucosa, and tongue normal; without oral thrush or ulcers. Resp: clear to auscultation bilaterally without rhonchi, wheezes or dullness to  percussion. Cardio: regular rate and rhythm, S1, S2 normal, no murmur, click, rub or gallop GI: soft, non-tender; bowel sounds normal; no masses,  no organomegaly Skin: Skin color, texture, turgor normal. No rashes or lesions Lymph nodes: Cervical, supraclavicular, and axillary nodes normal. Neurologic: Grossly normal without any motor, sensory or deep tendon reflexes. Musculoskeletal: No joint deformity or effusion.  Recent Labs    08/30/19 1319  WBC 6.6  HGB 11.3  HCT 34.1  PLT 443   Recent Labs    08/30/19 1319  NA 141  K 3.9  CL 98  CO2 29  GLUCOSE 113*  BUN 10  CREATININE 0.81  CALCIUM 9.4      CT ABDOMEN PELVIS W CONTRAST  Result Date: 08/22/2019 CLINICAL DATA:  Abdominal pain with vomiting and diarrhea. EXAM: CT ABDOMEN AND PELVIS WITH CONTRAST TECHNIQUE: Multidetector CT imaging of the abdomen and pelvis was performed using the standard protocol following bolus administration of intravenous contrast. CONTRAST:  173mL OMNIPAQUE IOHEXOL 300 MG/ML  SOLN COMPARISON:  Abdominopelvic CT 04/25/2019. FINDINGS: Lower chest: Interval improved aeration of the lung bases with mild residual linear scarring or atelectasis bilaterally. No pleural or pericardial effusion. Aortic atherosclerosis noted. Hepatobiliary: New complete thrombosis of the left portal vein. The right and main portal veins are patent. Mildly asymmetric hepatic enhancement attributed to the portal vein thrombosis. No focal hepatic lesions identified. No evidence of gallstones, gallbladder wall thickening or biliary dilatation. Pancreas: Unremarkable. No pancreatic ductal dilatation or surrounding inflammatory changes. Spleen: Normal in size without focal abnormality. Adrenals/Urinary Tract: Both adrenal glands appear normal. Stable small right renal cysts. No evidence of urinary tract calculus or hydronephrosis. The bladder appears normal. Stomach/Bowel: The stomach, small bowel and appendix appear normal. There are  extensive diverticular changes throughout the colon with mild sigmoid colon wall thickening, similar to previous study. No surrounding inflammatory changes to suggest active diverticulitis. No evidence of bowel obstruction or perforation. Vascular/Lymphatic: There are no enlarged abdominal or pelvic lymph nodes. Small lymph nodes in the porta hepatis are slightly more prominent, although likely reactive. As above, thrombosis of the left portal vein. The right and main portal veins, superior mesenteric and splenic veins are patent. The IVC and iliac veins appear patent. Aortic and branch vessel atherosclerosis without evidence of large vessel arterial occlusion. Reproductive: Hysterectomy. Stable appearance of the adnexa without suspicious findings. Other: No evidence of abdominal wall mass or hernia. No ascites. Musculoskeletal: No acute or significant osseous findings. Stable lumbar spondylosis. IMPRESSION: 1. 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 No signs of underlying malignancy. 2. Extensive colonic diverticulosis without evidence of active diverticulitis. 3. Aortic Atherosclerosis (ICD10-I70.0). 4. Critical Value/emergent results were called by telephone at the time of interpretation on 08/22/2019 at 3:15 pm to provider JOSEPH ZAMMIT , who verbally acknowledged these results. Electronically Signed   By: Richardean Sale M.D.   On: 08/22/2019 15:17    Assessment and Plan:   74 year old woman with:  1.  Acute portal vein thrombosis diagnosed in April 2021.  She presented with GI complaints preceding that event including nausea, vomiting and diarrhea.  CT scan obtained on 08/22/2019 showed acute to portal vein thrombosis that is new without any evidence of malignancy or cirrhosis of the liver.  The natural course of these findings as well as etiology of portal vein thrombosis was reviewed at this time.  Inherited and acquired thrombophilia was discussed also in detail.  Inherited thrombophilia  appears to be less likely at this time given the late manifestation of thrombosis and previous pregnancies without any issues.  Acquired thrombophilia and myeloproliferative disorder are possibilities but are considered less likely.  On August 30, 2019 her CBC showed normal counts which indicates less likely a myeloproliferative disorder.  From a management standpoint, I have recommended at least 3 months of anticoagulation and repeat imaging studies of the abdomen and pelvis.  Her thrombosis has improved and now discontinuation of anticoagulation may be recommended but would favor 6 months of anticoagulation screening for inherited or acquired thrombophilia was discussed at this time but was deferred after discussion based on her preferences.  2.  Follow-up: Will be in 3 months after repeat imaging studies and will determine the duration of anticoagulation based on that scan.  45  minutes were dedicated to this visit. The time was spent on reviewing laboratory data, imaging studies, discussing treatment options, discussing differential diagnosis and answering questions regarding future plan.    A copy of this consult has been forwarded to the requesting physician.

## 2019-09-24 ENCOUNTER — Telehealth: Payer: Self-pay

## 2019-09-24 NOTE — Telephone Encounter (Signed)
The original prescription was from the hospital, who will not refill it.  She should request it from Dr. Alen Blew (the heme-onc doctor she saw in follow-up), as he will ultimately be the one to determine how long she will remain on it.  I believe she has f/u with him scheduled for July, so he should refill it for her if she requests it from their office.  And then in July determine how long she will need to stay on it and refill it from there. If she has trouble reaching them for rx, I'd be happy to refill it, but truly it should come from their office as they will be the ones to help determine when it can be stopped.  Please advise pt, thanks.

## 2019-09-24 NOTE — Telephone Encounter (Signed)
Ok to refill x30d only. Please put message to pharmacy that refill request should be directed to Dr. Hazeline Junker office. I didn't see any messages/requests to their office in the chart.

## 2019-09-24 NOTE — Telephone Encounter (Signed)
I called the pharmacy and called in one refill on her Eliquis for a 30 day supply and had the pharmacists put a note in the system that this should be filled by her Oncologist next time Dr. Alen Blew.

## 2019-09-24 NOTE — Telephone Encounter (Signed)
I called pt. Back and she said that she has been trying to reach Dr. Alen Blew office for the past two days and has had a really hard time getting a hold of him to get it refilled by him. So she wanted to know if you could refill it for her.

## 2019-09-24 NOTE — Telephone Encounter (Signed)
Pt. Called stating she needed you to refill a prescription for her her blood thinner medication but it was given to her about another doctor. She wanted to know if you could start feeling it for her she doesn't know that other doctor and can not get it refilled by him. It is very important she gets it refilled though to the [facility]. Looks like she is on Eliquis.

## 2019-10-06 ENCOUNTER — Other Ambulatory Visit: Payer: Self-pay

## 2019-10-07 ENCOUNTER — Other Ambulatory Visit: Payer: Medicare Other

## 2019-10-07 ENCOUNTER — Other Ambulatory Visit: Payer: Self-pay

## 2019-10-07 DIAGNOSIS — R413 Other amnesia: Secondary | ICD-10-CM | POA: Diagnosis not present

## 2019-10-07 DIAGNOSIS — R7303 Prediabetes: Secondary | ICD-10-CM

## 2019-10-07 DIAGNOSIS — I10 Essential (primary) hypertension: Secondary | ICD-10-CM

## 2019-10-07 DIAGNOSIS — E7800 Pure hypercholesterolemia, unspecified: Secondary | ICD-10-CM | POA: Diagnosis not present

## 2019-10-07 DIAGNOSIS — Z5181 Encounter for therapeutic drug level monitoring: Secondary | ICD-10-CM

## 2019-10-08 LAB — CBC WITH DIFFERENTIAL/PLATELET
Basophils Absolute: 0 10*3/uL (ref 0.0–0.2)
Basos: 0 %
EOS (ABSOLUTE): 0 10*3/uL (ref 0.0–0.4)
Eos: 1 %
Hematocrit: 39.5 % (ref 34.0–46.6)
Hemoglobin: 12.8 g/dL (ref 11.1–15.9)
Immature Grans (Abs): 0 10*3/uL (ref 0.0–0.1)
Immature Granulocytes: 0 %
Lymphocytes Absolute: 1.6 10*3/uL (ref 0.7–3.1)
Lymphs: 35 %
MCH: 27.9 pg (ref 26.6–33.0)
MCHC: 32.4 g/dL (ref 31.5–35.7)
MCV: 86 fL (ref 79–97)
Monocytes Absolute: 0.3 10*3/uL (ref 0.1–0.9)
Monocytes: 7 %
Neutrophils Absolute: 2.7 10*3/uL (ref 1.4–7.0)
Neutrophils: 57 %
Platelets: 223 10*3/uL (ref 150–450)
RBC: 4.58 x10E6/uL (ref 3.77–5.28)
RDW: 14.6 % (ref 11.7–15.4)
WBC: 4.7 10*3/uL (ref 3.4–10.8)

## 2019-10-08 LAB — COMPREHENSIVE METABOLIC PANEL
ALT: 18 IU/L (ref 0–32)
AST: 23 IU/L (ref 0–40)
Albumin/Globulin Ratio: 1.5 (ref 1.2–2.2)
Albumin: 4 g/dL (ref 3.7–4.7)
Alkaline Phosphatase: 74 IU/L (ref 48–121)
BUN/Creatinine Ratio: 15 (ref 12–28)
BUN: 14 mg/dL (ref 8–27)
Bilirubin Total: 0.7 mg/dL (ref 0.0–1.2)
CO2: 28 mmol/L (ref 20–29)
Calcium: 9.3 mg/dL (ref 8.7–10.3)
Chloride: 101 mmol/L (ref 96–106)
Creatinine, Ser: 0.96 mg/dL (ref 0.57–1.00)
GFR calc Af Amer: 67 mL/min/{1.73_m2} (ref >59)
GFR calc non Af Amer: 58 mL/min/{1.73_m2} — ABNORMAL LOW (ref >59)
Globulin, Total: 2.6 g/dL (ref 1.5–4.5)
Glucose: 89 mg/dL (ref 65–99)
Potassium: 3.7 mmol/L (ref 3.5–5.2)
Sodium: 140 mmol/L (ref 134–144)
Total Protein: 6.6 g/dL (ref 6.0–8.5)

## 2019-10-08 LAB — LIPID PANEL
Chol/HDL Ratio: 3.4 ratio (ref 0.0–4.4)
Cholesterol, Total: 182 mg/dL (ref 100–199)
HDL: 54 mg/dL (ref >39)
LDL Chol Calc (NIH): 108 mg/dL — ABNORMAL HIGH (ref 0–99)
Triglycerides: 109 mg/dL (ref 0–149)
VLDL Cholesterol Cal: 20 mg/dL (ref 5–40)

## 2019-10-08 LAB — TSH: TSH: 2.84 u[IU]/mL (ref 0.450–4.500)

## 2019-10-08 LAB — HEMOGLOBIN A1C
Est. average glucose Bld gHb Est-mCnc: 123 mg/dL
Hgb A1c MFr Bld: 5.9 % — ABNORMAL HIGH (ref 4.8–5.6)

## 2019-10-12 NOTE — Patient Instructions (Addendum)
HEALTH MAINTENANCE RECOMMENDATIONS:  It is recommended that you get at least 30 minutes of aerobic exercise at least 5 days/week (for weight loss, you may need as much as 60-90 minutes). This can be any activity that gets your heart rate up. This can be divided in 10-15 minute intervals if needed, but try and build up your endurance at least once a week.  Weight bearing exercise is also recommended twice weekly.  Eat a healthy diet with lots of vegetables, fruits and fiber.  "Colorful" foods have a lot of vitamins (ie green vegetables, tomatoes, red peppers, etc).  Limit sweet tea, regular sodas and alcoholic beverages, all of which has a lot of calories and sugar.  Up to 1 alcoholic drink daily may be beneficial for women (unless trying to lose weight, watch sugars).  Drink a lot of water.  Calcium recommendations are 1200-1500 mg daily (1500 mg for postmenopausal women or women without ovaries), and vitamin D 1000 IU daily.  This should be obtained from diet and/or supplements (vitamins), and calcium should not be taken all at once, but in divided doses.  Monthly self breast exams and yearly mammograms for women over the age of 46 is recommended.  Sunscreen of at least SPF 30 should be used on all sun-exposed parts of the skin when outside between the hours of 10 am and 4 pm (not just when at beach or pool, but even with exercise, golf, tennis, and yard work!)  Use a sunscreen that says "broad spectrum" so it covers both UVA and UVB rays, and make sure to reapply every 1-2 hours.  Remember to change the batteries in your smoke detectors when changing your clock times in the spring and fall. Carbon monoxide detectors are recommended for your home.  Use your seat belt every time you are in a car, and please drive safely and not be distracted with cell phones and texting while driving.   Sylvia Gutierrez , Thank you for taking time to come for your Medicare Wellness Visit. I appreciate your ongoing  commitment to your health goals. Please review the following plan we discussed and let me know if I can assist you in the future.    This is a list of the screening recommended for you and due dates:  Health Maintenance  Topic Date Due  . COVID-19 Vaccine (1) Never done  . Flu Shot  12/12/2019  . Tetanus Vaccine  06/19/2021  . Colon Cancer Screening  02/12/2023  . DEXA scan (bone density measurement)  Completed  .  Hepatitis C: One time screening is recommended by Center for Disease Control  (CDC) for  adults born from [birth year] through 1966.   Completed  . Pneumonia vaccines  Completed   Continue yearly high dose flu shots in the Fall (September/October).  You can schedule a nurse visit at our office when convenient for you.  COVID vaccine is highly recommended.  If you develop any symptoms of COVID (ie cold symptoms, etc), please get evaluated and tested right away--if you do get COVID, it is recommended that you be treated with monoclonal antibodies, which is time sensitive (needs to be started within 10 days of onset of symptoms).  Please fill out the living will and healthcare power of attorney.  We would like a copy once it is notarized to be scanned into your medical chart.  Your blood pressure was a little high (when repeated).  Please follow low sodium diet, exercise daily, work on weight loss. Try  and check your blood pressure elsewhere, and if it is consistently over 140/85-90, please return sooner than 6 months to follow-up on your blood pressure. Goal is <130/80.    DASH Eating Plan DASH stands for "Dietary Approaches to Stop Hypertension." The DASH eating plan is a healthy eating plan that has been shown to reduce high blood pressure (hypertension). It may also reduce your risk for type 2 diabetes, heart disease, and stroke. The DASH eating plan may also help with weight loss. What are tips for following this plan?  General guidelines  Avoid eating more than 2,300 mg  (milligrams) of salt (sodium) a day. If you have hypertension, you may need to reduce your sodium intake to 1,500 mg a day.  Limit alcohol intake to no more than 1 drink a day for nonpregnant women and 2 drinks a day for men. One drink equals 12 oz of beer, 5 oz of wine, or 1 oz of hard liquor.  Work with your health care provider to maintain a healthy body weight or to lose weight. Ask what an ideal weight is for you.  Get at least 30 minutes of exercise that causes your heart to beat faster (aerobic exercise) most days of the week. Activities may include walking, swimming, or biking.  Work with your health care provider or diet and nutrition specialist (dietitian) to adjust your eating plan to your individual calorie needs. Reading food labels   Check food labels for the amount of sodium per serving. Choose foods with less than 5 percent of the Daily Value of sodium. Generally, foods with less than 300 mg of sodium per serving fit into this eating plan.  To find whole grains, look for the word "whole" as the first word in the ingredient list. Shopping  Buy products labeled as "low-sodium" or "no salt added."  Buy fresh foods. Avoid canned foods and premade or frozen meals. Cooking  Avoid adding salt when cooking. Use salt-free seasonings or herbs instead of table salt or sea salt. Check with your health care provider or pharmacist before using salt substitutes.  Do not fry foods. Cook foods using healthy methods such as baking, boiling, grilling, and broiling instead.  Cook with heart-healthy oils, such as olive, canola, soybean, or sunflower oil. Meal planning  Eat a balanced diet that includes: ? 5 or more servings of fruits and vegetables each day. At each meal, try to fill half of your plate with fruits and vegetables. ? Up to 6-8 servings of whole grains each day. ? Less than 6 oz of lean meat, poultry, or fish each day. A 3-oz serving of meat is about the same size as a deck  of cards. One egg equals 1 oz. ? 2 servings of low-fat dairy each day. ? A serving of nuts, seeds, or beans 5 times each week. ? Heart-healthy fats. Healthy fats called Omega-3 fatty acids are found in foods such as flaxseeds and coldwater fish, like sardines, salmon, and mackerel.  Limit how much you eat of the following: ? Canned or prepackaged foods. ? Food that is high in trans fat, such as fried foods. ? Food that is high in saturated fat, such as fatty meat. ? Sweets, desserts, sugary drinks, and other foods with added sugar. ? Full-fat dairy products.  Do not salt foods before eating.  Try to eat at least 2 vegetarian meals each week.  Eat more home-cooked food and less restaurant, buffet, and fast food.  When eating at a restaurant,  ask that your food be prepared with less salt or no salt, if possible. What foods are recommended? The items listed may not be a complete list. Talk with your dietitian about what dietary choices are best for you. Grains Whole-grain or whole-wheat bread. Whole-grain or whole-wheat pasta. Brown rice. Modena Morrow. Bulgur. Whole-grain and low-sodium cereals. Pita bread. Low-fat, low-sodium crackers. Whole-wheat flour tortillas. Vegetables Fresh or frozen vegetables (raw, steamed, roasted, or grilled). Low-sodium or reduced-sodium tomato and vegetable juice. Low-sodium or reduced-sodium tomato sauce and tomato paste. Low-sodium or reduced-sodium canned vegetables. Fruits All fresh, dried, or frozen fruit. Canned fruit in natural juice (without added sugar). Meat and other protein foods Skinless chicken or [place]. Ground chicken or [place]. Pork with fat trimmed off. Fish and seafood. Egg whites. Dried beans, peas, or lentils. Unsalted nuts, nut butters, and seeds. Unsalted canned beans. Lean cuts of beef with fat trimmed off. Low-sodium, lean deli meat. Dairy Low-fat (1%) or fat-free (skim) milk. Fat-free, low-fat, or reduced-fat cheeses. Nonfat,  low-sodium ricotta or cottage cheese. Low-fat or nonfat yogurt. Low-fat, low-sodium cheese. Fats and oils Soft margarine without trans fats. Vegetable oil. Low-fat, reduced-fat, or light mayonnaise and salad dressings (reduced-sodium). Canola, safflower, olive, soybean, and sunflower oils. Avocado. Seasoning and other foods Herbs. Spices. Seasoning mixes without salt. Unsalted popcorn and pretzels. Fat-free sweets. What foods are not recommended? The items listed may not be a complete list. Talk with your dietitian about what dietary choices are best for you. Grains Baked goods made with fat, such as croissants, muffins, or some breads. Dry pasta or rice meal packs. Vegetables Creamed or fried vegetables. Vegetables in a cheese sauce. Regular canned vegetables (not low-sodium or reduced-sodium). Regular canned tomato sauce and paste (not low-sodium or reduced-sodium). Regular tomato and vegetable juice (not low-sodium or reduced-sodium). Angie Fava. Olives. Fruits Canned fruit in a light or heavy syrup. Fried fruit. Fruit in cream or butter sauce. Meat and other protein foods Fatty cuts of meat. Ribs. Fried meat. Berniece Salines. Sausage. Bologna and other processed lunch meats. Salami. Fatback. Hotdogs. Bratwurst. Salted nuts and seeds. Canned beans with added salt. Canned or smoked fish. Whole eggs or egg yolks. Chicken or [place] with skin. Dairy Whole or 2% milk, cream, and half-and-half. Whole or full-fat cream cheese. Whole-fat or sweetened yogurt. Full-fat cheese. Nondairy creamers. Whipped toppings. Processed cheese and cheese spreads. Fats and oils Butter. Stick margarine. Lard. Shortening. Ghee. Bacon fat. Tropical oils, such as coconut, palm kernel, or palm oil. Seasoning and other foods Salted popcorn and pretzels. Onion salt, garlic salt, seasoned salt, table salt, and sea salt. Worcestershire sauce. Tartar sauce. Barbecue sauce. Teriyaki sauce. Soy sauce, including reduced-sodium. Steak sauce.  Canned and packaged gravies. Fish sauce. Oyster sauce. Cocktail sauce. Horseradish that you find on the shelf. Ketchup. Mustard. Meat flavorings and tenderizers. Bouillon cubes. Hot sauce and Tabasco sauce. Premade or packaged marinades. Premade or packaged taco seasonings. Relishes. Regular salad dressings. Where to find more information:  National Heart, Lung, and [place]: https://wilson-eaton.com/  American Heart Association: www.heart.org Summary  The DASH eating plan is a healthy eating plan that has been shown to reduce high blood pressure (hypertension). It may also reduce your risk for type 2 diabetes, heart disease, and stroke.  With the DASH eating plan, you should limit salt (sodium) intake to 2,300 mg a day. If you have hypertension, you may need to reduce your sodium intake to 1,500 mg a day.  When on the DASH eating plan, aim to eat more fresh  fruits and vegetables, whole grains, lean proteins, low-fat dairy, and heart-healthy fats.  Work with your health care provider or diet and nutrition specialist (dietitian) to adjust your eating plan to your individual calorie needs. This information is not intended to replace advice given to you by your health care provider. Make sure you discuss any questions you have with your health care provider. Document Revised: 04/11/2017 Document Reviewed: 04/22/2016 Elsevier Patient Education  2020 [employer].

## 2019-10-12 NOTE — Progress Notes (Signed)
Chief Complaint  Patient presents with  . Medicare Wellness    AWV with pelvic, labs already done. No new concerns. Has not had COVID vaccines.     Sylvia Gutierrez is a 74 y.o. female who presents for annual physical exam, Medicare wellness visit and follow-up on chronic medical conditions.  She had labs done prior to her visit, see below.  She saw Dr. Alen Blew since our last visit, for follow-up on acute portal vein thrombosis, diagnosed in April 2021.  Dr. Alen Blew recommended at least 3 months of anticoagulation, with repeat imaging studies of the abdomen and pelvis in 3 mos. Duration of anticoagulation will be based on scan results.  Previously reported some allergy symptoms with sneezing, itchy eyes, runny nose (especially when she holds her head down). She continues to report the same symptoms, especially the runny nose. She has never tried taking any antihistamines, doesn't feel it is bad enough.  She feels like wearing a mask makes her cough a little.  Hypertension follow-up: BP'sarenot checked elsewhere. Denies dizziness, headaches, chest pain. Denies side effects of medications. She tries to follow low sodium diet, but admits she "is not working hard at it".  Hyperlipidemia follow-up: Patient is reportedly trying to follow a low-fat, low cholesterol diet. Compliant with medications (pravastatin) and denies medication side effects.She had labs done prior to her visit, see below.   She previously mentioned some memory concerns--she might put something in the refrigerator that belongs in the freezer.  She loses her cell phone--it may be in her hand, or very near her. Denies getting lost on her normal routes (just when in unfamiliar areas).  MMSE and Mini-Cog were normal. She denies any changes or worsening of her memory.  H/o adenomatous colon polyps, last colonoscopy was in 02/2018 by Dr. Hilarie Fredrickson. She had 2 tubular adenomas, in addition to diverticulosis and internal hemorrhoids. 5  year follow-up was recommended. She denies any GI complaints.  Obesityand pre-diabetes:When first diagnosed she was drinking grape juice, +sweets/cookies. She no longer drinks much juice, but hasn't been as good with her diet--eating ice cream.  Bilateral knee OA:  She has seen Dr. Marlou Sa in the past, not since 2018, doing well. They flare sometimes, but not as bad as in the past.  Immunization History  Administered Date(s) Administered  . Fluad Quad(high Dose 65+) 01/07/2019  . Influenza, High Dose Seasonal PF 03/04/2013, 02/14/2014, 02/27/2015, 04/25/2016, 06/09/2017  . PPD Test 09/01/2013  . Pneumococcal Conjugate-13 02/14/2014  . Pneumococcal Polysaccharide-23 11/28/2010  . Td 05/13/2005  . Tdap 06/20/2011  . Zoster 01/10/2010  . Zoster Recombinat (Shingrix) 01/07/2019, 03/08/2019   Last Pap smear: 07/04/08, s/p hysterectomy Last mammogram: 04/2019 Last colonoscopy: 02/2018, +polyps, 5 year f/u recommended Last DEXA: 04/2016 normal (lowest was T- 0.7 at L fem neck) Dentist: goes once yearly, had the rest of her teeth removed at her last visit last year. Ophtho:yearly, due. Exercise:She goes to the [facility] 3 days/week (elliptical x 1 hour, walks; no weights). Normal vitamin D level in 2014  Other doctors caring for patient include: GI:Dr. Pyrtle Ophtho: Dr. Katy Fitch Dentist: [facility] [place]: [facility]: Dr. Marlou Sa Heme-onc: Dr. Alen Blew (for portal vein thrombosis)  Depressionscreen negative Fallscreen negative Functional Statusscreennotable for slight memory issue (discussed above), unchanged. Doesn't like walking up steps. Mini-Cog screen normal see Epicfor full screens.   End of Life Discussion: Patient does not havea living will and medical power of attorney. She still has the papers at home.  PMH, PSH, SH reviewed  Outpatient Encounter Medications as of 10/13/2019  Medication Sig Note  . atenolol (TENORMIN) 25 MG  tablet TAKE 1 TABLET(25 MG) BY MOUTH DAILY (Patient taking differently: Take 25 mg by mouth daily. )   . cholecalciferol (VITAMIN D) 1000 UNITS tablet Take 1,000 Units by mouth daily.   Marland Kitchen ELIQUIS 5 MG TABS tablet Take 5 mg by mouth 2 (two) times daily.   Marland Kitchen lisinopril-hydrochlorothiazide (ZESTORETIC) 20-12.5 MG tablet TAKE 1 TABLET BY MOUTH DAILY   . pravastatin (PRAVACHOL) 40 MG tablet TAKE 1 TABLET(40 MG) BY MOUTH DAILY (Patient taking differently: Take 40 mg by mouth daily. )   . [DISCONTINUED] APIXABAN (ELIQUIS) VTE STARTER PACK (10MG  AND 5MG ) Take as directed on package: start with two-5mg  tablets twice daily for 7 days. On day 8, switch to one-5mg  tablet twice daily. 08/30/2019: On Day 4 10mg  BID   No facility-administered encounter medications on file as of 10/13/2019.   No Known Allergies  ROS: The patient denies fever, vision changes, decreased hearing, ear pain, sore throat, breast concerns, chest pain, palpitations, dizziness, syncope, dyspnea on exertion, nausea, vomiting, diarrhea, constipation, abdominal pain, melena, hematochezia, indigestion/heartburn, hematuria, incontinence, dysuria, vaginal bleeding, discharge, odor or itch, genital lesions, weakness, tremor, suspicious skin lesions, depression, anxiety, abnormal bleeding/bruising, or enlarged lymph nodes. Knee pain improved, only mild. Runny nose if she leans forward, along with some sneezing and itchy eyes at times.   PHYSICAL EXAM:  BP (!) 170/80   Pulse 64   Ht 5\' 1"  (1.549 m)   Wt 191 lb 3.2 oz (86.7 kg)   BMI 36.13 kg/m   Patient was late and rushing. Repeat BP 144/80 on repeat by MD  Wt Readings from Last 3 Encounters:  10/13/19 191 lb 3.2 oz (86.7 kg)  09/01/19 195 lb 14.4 oz (88.9 kg)  08/30/19 196 lb 6.4 oz (89.1 kg)    General Appearance:  Alert, cooperative, no distress, appears stated age.  Head:  Normocephalic, without obvious abnormality, atraumatic.  Eyes:  PERRL, conjunctiva/corneas clear,  EOM's intact, fundinot well visualized  Ears:  Normal TM's and external ear canals   Nose:  Not examined (wearing mask due to coronavirus pandemic)  Throat:  Not examined (wearing mask due to coronavirus pandemic)  Neck:  Supple, no lymphadenopathy; thyroid: no enlargement/tenderness/nodules; no carotid bruit or JVD   Back:  Spine nontender, no curvature, ROM normal, no CVA tenderness   Lungs:  Clear to auscultation bilaterally without wheezes, rales or ronchi; respirations unlabored   Chest Wall:  No tenderness or deformity   Heart:  Regular rate and rhythm, S1 and S2 normal, no murmur, rub or gallop   Breast Exam:  No tenderness, masses, or nipple discharge or inversion. No axillary lymphadenopathy   Abdomen:  Soft, non-tender, nondistended, normoactive bowel sounds, no masses, no hepatosplenomegaly   Genitalia:  Normal external genitalia without lesions. BUS and vagina normal; uterus surgically absent. No adnexal masses or tenderness. Pap not performed   Rectal:  Normal tone, no masses or tenderness; guaiac negative stool   Extremities:  No clubbing, cyanosis or edema.   Pulses:  2+ and symmetric all extremities   Skin:  Skin color, texture, turgor normal, no rashes or lesions. Sebaceous cyst noted at left mid-back, raised but nontender  Lymph nodes:  Cervical, supraclavicular, and axillary nodes normal   Neurologic:  CNII-XII intact, normal strength, sensation and gait; reflexes 2+ and symmetric throughout   Psych: Normal mood, affect, hygiene and grooming  Lab Results  Component Value Date   HGBA1C 5.9 (H) 10/07/2019   Fasting glucose 89    Chemistry      Component Value Date/Time   NA 140 10/07/2019 1012   K 3.7 10/07/2019 1012   CL 101 10/07/2019 1012   CO2 28 10/07/2019 1012   BUN 14 10/07/2019 1012   CREATININE 0.96 10/07/2019 1012   CREATININE 0.99 (H) 10/28/2016 1027      Component Value Date/Time   CALCIUM  9.3 10/07/2019 1012   ALKPHOS 74 10/07/2019 1012   AST 23 10/07/2019 1012   ALT 18 10/07/2019 1012   BILITOT 0.7 10/07/2019 1012     Lab Results  Component Value Date   CHOL 182 10/07/2019   HDL 54 10/07/2019   LDLCALC 108 (H) 10/07/2019   TRIG 109 10/07/2019   CHOLHDL 3.4 10/07/2019   Lab Results  Component Value Date   TSH 2.840 10/07/2019   Lab Results  Component Value Date   WBC 4.7 10/07/2019   HGB 12.8 10/07/2019   HCT 39.5 10/07/2019   MCV 86 10/07/2019   PLT 223 10/07/2019    ASSESSMENT/PLAN:  Annual physical exam  Medicare annual wellness visit, subsequent  Pure hypercholesterolemia - controlled; continue low cholesterol diet and pravastatin  Essential hypertension, benign  Portal vein thrombosis - on eliquis, with re-imaging and f/u planned with hematologist  Tubular adenoma of colon - colonoscopy due 02/2023  IFG (impaired fasting glucose) - counseled re: diet, exercise, weight loss  Class 2 obesity due to excess calories without serious comorbidity with body mass index (BMI) of 36.0 to 36.9 in adult - counseled re: healthy diet, portions, exercise, weight loss, risks of obesity  Vaccine counseling - counseled in detail regarding COVID vaccine, encouraged her to get  Discussed monthly self breast exams and yearly mammograms; at least 30 minutes of aerobic activity at least 5 days/week and weight-bearing exercise 2x/week; proper sunscreen use reviewed; healthy diet, including goals of calcium and vitamin D intake and alcohol recommendations (less than or equal to 1 drink/day) reviewed; regular seatbelt use; changing batteries in smoke detectors. Immunization recommendations discussed, UTD. Continue high doseflu shots yearly in the Fall. COVID vaccine recommended. Colonoscopy is UTD, repeat due 02/2023  COVID vaccine counseling given, strongly encouraged, as well as treatments/prevention of COVID.  Full Code, Full Care Discussed paperwork for Living  Will and Healthcare POA (still has at home), discussed reasons for completing and discussing with family, and getting [place] copies.    Medicare Attestation I have personally reviewed: The patient's medical and social history Their use of alcohol, tobacco or illicit drugs Their current medications and supplements The patient's functional ability including ADLs,fall risks, home safety risks, cognitive, and hearing and visual impairment Diet and physical activities Evidence for depression or mood disorders  The patient's weight, height, BMI have been recorded in the chart.  I have made referrals, counseling, and provided education to the patient based on review of the above and I have provided the patient with a written personalized care plan for preventive services.

## 2019-10-13 ENCOUNTER — Encounter: Payer: Self-pay | Admitting: Family Medicine

## 2019-10-13 ENCOUNTER — Ambulatory Visit (INDEPENDENT_AMBULATORY_CARE_PROVIDER_SITE_OTHER): Payer: Medicare Other | Admitting: Family Medicine

## 2019-10-13 ENCOUNTER — Other Ambulatory Visit: Payer: Self-pay

## 2019-10-13 VITALS — BP 170/80 | HR 64 | Ht 61.0 in | Wt 191.2 lb

## 2019-10-13 DIAGNOSIS — E7800 Pure hypercholesterolemia, unspecified: Secondary | ICD-10-CM

## 2019-10-13 DIAGNOSIS — I10 Essential (primary) hypertension: Secondary | ICD-10-CM | POA: Diagnosis not present

## 2019-10-13 DIAGNOSIS — R7301 Impaired fasting glucose: Secondary | ICD-10-CM

## 2019-10-13 DIAGNOSIS — I81 Portal vein thrombosis: Secondary | ICD-10-CM

## 2019-10-13 DIAGNOSIS — Z7189 Other specified counseling: Secondary | ICD-10-CM | POA: Diagnosis not present

## 2019-10-13 DIAGNOSIS — D126 Benign neoplasm of colon, unspecified: Secondary | ICD-10-CM | POA: Diagnosis not present

## 2019-10-13 DIAGNOSIS — Z0000 Encounter for general adult medical examination without abnormal findings: Secondary | ICD-10-CM | POA: Diagnosis not present

## 2019-10-13 DIAGNOSIS — E6609 Other obesity due to excess calories: Secondary | ICD-10-CM

## 2019-10-13 DIAGNOSIS — Z7185 Encounter for immunization safety counseling: Secondary | ICD-10-CM

## 2019-10-13 DIAGNOSIS — Z6836 Body mass index (BMI) 36.0-36.9, adult: Secondary | ICD-10-CM

## 2019-10-19 ENCOUNTER — Other Ambulatory Visit: Payer: Self-pay

## 2019-10-19 ENCOUNTER — Telehealth: Payer: Self-pay

## 2019-10-19 MED ORDER — ELIQUIS 5 MG PO TABS: 5.0000 mg | ORAL_TABLET | Freq: Two times a day (BID) | ORAL | 1 refills | Status: DC

## 2019-10-19 NOTE — Telephone Encounter (Signed)
-----   Message from Wyatt Portela, MD sent at 10/19/2019 11:28 AM EDT ----- Regarding: RE: Refill Yes. Thanks ----- Message ----- From: Tami Lin, RN Sent: 10/19/2019  11:17 AM EDT To: Wyatt Portela, MD Subject: Refill                                         Patient is requesting a refill on Eliquis 5 mg BID. She was prescribed this upon discharge from the hospital in May for portal vein thrombosis and now needs a refill. Her next appointment with you is 11/30/19. Ok to send in a refill? Lanelle Bal

## 2019-10-19 NOTE — Telephone Encounter (Signed)
Refill sent to [facility]. Patient made aware.

## 2019-11-23 ENCOUNTER — Other Ambulatory Visit: Payer: Self-pay | Admitting: Oncology

## 2019-11-23 ENCOUNTER — Other Ambulatory Visit: Payer: Self-pay

## 2019-11-23 ENCOUNTER — Inpatient Hospital Stay: Payer: Medicare Other | Attending: Oncology

## 2019-11-23 ENCOUNTER — Ambulatory Visit (HOSPITAL_COMMUNITY)
Admission: RE | Admit: 2019-11-23 | Discharge: 2019-11-23 | Disposition: A | Discharge status: Home / Self Care | Payer: Medicare Other | Source: Ambulatory Visit | Attending: Oncology | Admitting: Oncology

## 2019-11-23 DIAGNOSIS — I81 Portal vein thrombosis: Secondary | ICD-10-CM

## 2019-11-23 DIAGNOSIS — Z87891 Personal history of nicotine dependence: Secondary | ICD-10-CM | POA: Insufficient documentation

## 2019-11-23 DIAGNOSIS — Z7901 Long term (current) use of anticoagulants: Secondary | ICD-10-CM | POA: Insufficient documentation

## 2019-11-23 DIAGNOSIS — I700 Atherosclerosis of aorta: Secondary | ICD-10-CM | POA: Insufficient documentation

## 2019-11-23 DIAGNOSIS — K5730 Diverticulosis of large intestine without perforation or abscess without bleeding: Secondary | ICD-10-CM | POA: Diagnosis not present

## 2019-11-23 DIAGNOSIS — Z79899 Other long term (current) drug therapy: Secondary | ICD-10-CM | POA: Insufficient documentation

## 2019-11-23 DIAGNOSIS — K7689 Other specified diseases of liver: Secondary | ICD-10-CM | POA: Diagnosis not present

## 2019-11-23 DIAGNOSIS — I10 Essential (primary) hypertension: Secondary | ICD-10-CM | POA: Insufficient documentation

## 2019-11-23 LAB — CMP (CANCER CENTER ONLY)
ALT: 19 U/L (ref 0–44)
AST: 22 U/L (ref 15–41)
Albumin: 3.8 g/dL (ref 3.5–5.0)
Alkaline Phosphatase: 71 U/L (ref 38–126)
Anion gap: 12 (ref 5–15)
BUN: 23 mg/dL (ref 8–23)
CO2: 27 mmol/L (ref 22–32)
Calcium: 9.6 mg/dL (ref 8.9–10.3)
Chloride: 102 mmol/L (ref 98–111)
Creatinine: 1.01 mg/dL — ABNORMAL HIGH (ref 0.44–1.00)
GFR, Est AFR Am: >60 mL/min (ref >60)
GFR, Estimated: 54 mL/min — ABNORMAL LOW (ref >60)
Glucose, Bld: 100 mg/dL — ABNORMAL HIGH (ref 70–99)
Potassium: 3.7 mmol/L (ref 3.5–5.1)
Sodium: 141 mmol/L (ref 135–145)
Total Bilirubin: 0.6 mg/dL (ref 0.3–1.2)
Total Protein: 7.5 g/dL (ref 6.5–8.1)

## 2019-11-23 MED ORDER — SODIUM CHLORIDE (PF) 0.9 % IJ SOLN
INTRAMUSCULAR | Status: AC
  Filled 2019-11-23: qty 50

## 2019-11-23 MED ORDER — IOHEXOL 300 MG/ML  SOLN
100.0000 mL | Freq: Once | INTRAMUSCULAR | Status: AC | PRN
  Administered 2019-11-23: 100 mL via INTRAVENOUS

## 2019-11-24 ENCOUNTER — Other Ambulatory Visit: Payer: Self-pay | Admitting: Oncology

## 2019-11-24 NOTE — Progress Notes (Signed)
Results of the CT scan obtained on November 23, 2019 were personally reviewed and discussed with the patient via phone today.  He is completely asymptomatic at this time and her portal vein thrombosis although persistent has not progressed and no other areas of thrombosis noted.  Risks and benefits of continuing anticoagulation beyond 3 months were reviewed today.  At this time her preference is to discontinue treatment which would be reasonable at this time.  I recommended discontinuation of Eliquis.  This will be resumed if she develops any recurrence of her thrombosis in the future.  Happy to see her in the future as needed.

## 2019-11-25 ENCOUNTER — Other Ambulatory Visit: Payer: Self-pay | Admitting: Family Medicine

## 2019-11-25 DIAGNOSIS — E7800 Pure hypercholesterolemia, unspecified: Secondary | ICD-10-CM

## 2019-11-25 DIAGNOSIS — I10 Essential (primary) hypertension: Secondary | ICD-10-CM

## 2019-11-30 ENCOUNTER — Inpatient Hospital Stay: Payer: Medicare Other | Admitting: Oncology

## 2019-12-01 ENCOUNTER — Telehealth: Payer: Self-pay

## 2019-12-01 NOTE — Telephone Encounter (Signed)
Called patient and let her know that Dr. Alen Blew said it is ok to resume Aspirin 81 mg and fish oil daily. Patient verbalized understanding.

## 2019-12-01 NOTE — Telephone Encounter (Signed)
Error

## 2019-12-01 NOTE — Telephone Encounter (Signed)
-----   Message from Wyatt Portela, MD sent at 12/01/2019 12:06 PM EDT ----- Yes. Thanks ----- Message ----- From: Tami Lin, RN Sent: 12/01/2019  11:56 AM EDT To: Wyatt Portela, MD  Patient called and states she was instructed to stop taking Eliquis on 11/24/19. She wants to know if it is ok to resume Aspirin 81 mg daily and fish oil.  Lanelle Bal

## 2020-01-02 ENCOUNTER — Other Ambulatory Visit: Payer: Self-pay

## 2020-01-02 ENCOUNTER — Emergency Department (HOSPITAL_COMMUNITY)
Admission: EM | Admit: 2020-01-02 | Discharge: 2020-01-02 | Disposition: A | Discharge status: Home / Self Care | Payer: Medicare Other | Attending: Emergency Medicine | Admitting: Emergency Medicine

## 2020-01-02 DIAGNOSIS — R109 Unspecified abdominal pain: Secondary | ICD-10-CM | POA: Diagnosis not present

## 2020-01-02 DIAGNOSIS — Z5321 Procedure and treatment not carried out due to patient leaving prior to being seen by health care provider: Secondary | ICD-10-CM | POA: Insufficient documentation

## 2020-01-02 LAB — COMPREHENSIVE METABOLIC PANEL
ALT: 16 U/L (ref 0–44)
AST: 21 U/L (ref 15–41)
Albumin: 4 g/dL (ref 3.5–5.0)
Alkaline Phosphatase: 65 U/L (ref 38–126)
Anion gap: 10 (ref 5–15)
BUN: 25 mg/dL — ABNORMAL HIGH (ref 8–23)
CO2: 30 mmol/L (ref 22–32)
Calcium: 9.3 mg/dL (ref 8.9–10.3)
Chloride: 103 mmol/L (ref 98–111)
Creatinine, Ser: 1.08 mg/dL — ABNORMAL HIGH (ref 0.44–1.00)
GFR calc Af Amer: 58 mL/min — ABNORMAL LOW (ref >60)
GFR calc non Af Amer: 50 mL/min — ABNORMAL LOW (ref >60)
Glucose, Bld: 104 mg/dL — ABNORMAL HIGH (ref 70–99)
Potassium: 3.7 mmol/L (ref 3.5–5.1)
Sodium: 143 mmol/L (ref 135–145)
Total Bilirubin: 0.6 mg/dL (ref 0.3–1.2)
Total Protein: 7.2 g/dL (ref 6.5–8.1)

## 2020-01-02 LAB — CBC
HCT: 41.6 % (ref 36.0–46.0)
Hemoglobin: 13.3 g/dL (ref 12.0–15.0)
MCH: 28.2 pg (ref 26.0–34.0)
MCHC: 32 g/dL (ref 30.0–36.0)
MCV: 88.1 fL (ref 80.0–100.0)
Platelets: 208 10*3/uL (ref 150–400)
RBC: 4.72 MIL/uL (ref 3.87–5.11)
RDW: 14.6 % (ref 11.5–15.5)
WBC: 5.6 10*3/uL (ref 4.0–10.5)
nRBC: 0 % (ref 0.0–0.2)

## 2020-01-02 LAB — LIPASE, BLOOD: Lipase: 32 U/L (ref 11–51)

## 2020-01-02 NOTE — ED Triage Notes (Signed)
Pt c/o intermittent abd pains for several days. Denies n/v/d or urinary problems.

## 2020-01-03 ENCOUNTER — Ambulatory Visit (INDEPENDENT_AMBULATORY_CARE_PROVIDER_SITE_OTHER): Payer: Medicare Other | Admitting: Family Medicine

## 2020-01-03 ENCOUNTER — Encounter: Payer: Self-pay | Admitting: Family Medicine

## 2020-01-03 VITALS — BP 138/70 | HR 60 | Temp 98.2°F | Ht 61.0 in | Wt 195.0 lb

## 2020-01-03 DIAGNOSIS — I81 Portal vein thrombosis: Secondary | ICD-10-CM

## 2020-01-03 DIAGNOSIS — R1011 Right upper quadrant pain: Secondary | ICD-10-CM | POA: Diagnosis not present

## 2020-01-03 NOTE — Progress Notes (Signed)
Chief Complaint  Patient presents with  . Abdominal Pain    on and off, started about 2 weeks ago.    Patient went to the ER yesterday, but after 5 hours, left without being seen. She was contacted to schedule a visit for her symptoms. She reports that the pain is in the R abdomen, sometimes sharp/stabbing. Pain is short-lived, comes and goes.   Doesn't occur at night, only during the day. Not related to eating. No nausea, vomiting, change in bowels. Concerned it could be related to the portal vein thrombosis.   She completed 3 months of anticoagulation with Eliquis. She had repeat CT scan done in July (3 month follow-up) per Dr. Alen Blew. There was no progression, no other thrombosis, so eliquis was stopped.  She is worried she could have recurrent thrombosis, and wants repeat imaging.  PMH, PSH, SH reviewed  Outpatient Encounter Medications as of 01/03/2020  Medication Sig  . atenolol (TENORMIN) 25 MG tablet TAKE 1 TABLET(25 MG) BY MOUTH DAILY  . cholecalciferol (VITAMIN D) 1000 UNITS tablet Take 1,000 Units by mouth daily.  Marland Kitchen lisinopril-hydrochlorothiazide (ZESTORETIC) 20-12.5 MG tablet TAKE 1 TABLET BY MOUTH DAILY  . pravastatin (PRAVACHOL) 40 MG tablet TAKE 1 TABLET(40 MG) BY MOUTH DAILY  . ELIQUIS 5 MG TABS tablet Take 1 tablet (5 mg total) by mouth 2 (two) times daily. (Patient not taking: Reported on 01/03/2020)   No facility-administered encounter medications on file as of 01/03/2020.   No Known Allergies  ROS: no fever, chills, nausea, vomiting, bowel changes. No urinary complaints, URI symptoms, shortness of breath, chest pain, headaches, dizziness. No bleeding or rash   PHYSICAL EXAM:  BP 138/70   Pulse 60   Temp 98.2 F (36.8 C) (Tympanic)   Ht 5\' 1"  (1.549 m)   Wt 195 lb (88.5 kg)   BMI 36.84 kg/m   Pleasant, well-appearing female in no distress HEENT: conjunctiva and sclera are clear, EOMI.  Wearing mask Neck: no lymphadenopathy or mass Heart: regular rate and  rhythm Lungs: clear bilaterally Back: no spinal or CVA tenderness Abdomen: soft, nontender ,normal bowel sounds.  No epigastric tenderness, negative Murphy sign. No organomegaly or mass.  She is nontender on exam today, though area of discomfort, when occurs, is right upper abdomen. Extremities; Trace pitting edema bilaterally, normal pulses Skin: normal turgor, no rashes Psych: normal mood, affect, hygiene and grooming Neuro: alert and oriented, normal gait  Labs done in ER yesterday (pt hadn't gotten results): Lab Results  Component Value Date   WBC 5.6 01/02/2020   HGB 13.3 01/02/2020   HCT 41.6 01/02/2020   MCV 88.1 01/02/2020   PLT 208 01/02/2020   Lab Results  Component Value Date   LIPASE 32 01/02/2020     Chemistry      Component Value Date/Time   NA 143 01/02/2020 1113   NA 140 10/07/2019 1012   K 3.7 01/02/2020 1113   CL 103 01/02/2020 1113   CO2 30 01/02/2020 1113   BUN 25 (H) 01/02/2020 1113   BUN 14 10/07/2019 1012   CREATININE 1.08 (H) 01/02/2020 1113   CREATININE 1.01 (H) 11/23/2019 0818   CREATININE 0.99 (H) 10/28/2016 1027      Component Value Date/Time   CALCIUM 9.3 01/02/2020 1113   ALKPHOS 65 01/02/2020 1113   AST 21 01/02/2020 1113   AST 22 11/23/2019 0818   ALT 16 01/02/2020 1113   ALT 19 11/23/2019 0818   BILITOT 0.6 01/02/2020 1113   BILITOT 0.6  11/23/2019 0818     Glucose 104   ASSESSMENT/PLAN:  RUQ abdominal pain - short-lived, sharp, intermittent. Normal exam today. Normal labs yesterday.  ?if could be related to portal vein thrombosis and need to restart anticoag  Portal vein thrombosis - persistent per CT scan 11/2019, but no progression.  ?need for repeat imaging, vs restarting anticoagulant.  Will reach out to Dr. Alen Blew for his recommendation

## 2020-01-04 NOTE — Progress Notes (Signed)
Pt was advised and she wanted me to advise you she has stopped the fish oil supplement and now has no pain in her stomach. [place]

## 2020-01-04 NOTE — Progress Notes (Signed)
Please advise patient that I got a response today from Dr. Alen Blew.  He recommends that she extend the anticoagulation for 3 more months.  I believe she has another 1 month supply at her pharmacy (from him).  She should request refills from his office when needed, and set a follow-up appointment with him in 3 months.  (This was response I got:  "Given her persistent symptoms at this time, it would be reasonable to extend her anticoagulation for 3 more months.  The risks of bleeding remains low and will reevaluate after 3 months and discussed risks and benefits of extending anticoagulation beyond that.  There is really no clear recommendations regarding the duration of anticoagulation in these cases.")

## 2020-04-11 DIAGNOSIS — M79672 Pain in left foot: Secondary | ICD-10-CM | POA: Diagnosis not present

## 2020-04-11 DIAGNOSIS — M25572 Pain in left ankle and joints of left foot: Secondary | ICD-10-CM | POA: Diagnosis not present

## 2020-04-11 DIAGNOSIS — M79671 Pain in right foot: Secondary | ICD-10-CM | POA: Diagnosis not present

## 2020-04-11 DIAGNOSIS — M7672 Peroneal tendinitis, left leg: Secondary | ICD-10-CM | POA: Diagnosis not present

## 2020-04-12 NOTE — Progress Notes (Signed)
Chief Complaint  Patient presents with  . Hypertension    fasting med check, no new concerns.    Patient last seen 8/23 with R sided abdominal pain. She has h/o portal vein thrombosis in 08/2019, persistent per CT scan 11/2019, but no progression. Dr. Alen Blew had recommended additional 3 months of anticoagulation given her pain. She contacted [place] and reported that her pain resolved after she stopped takin fish oil. She finished out the medicine she had (not any extra).  She has had no further abdominal pain.  She was having some pain in her L leg, hurt getting into her car.  She saw the podiatrist, got an injection into the left ankle, and it is feeling much better.  Hypertension follow-up: BP'sarenot checked elsewhere. She is compliant with atenolol and lisinopril HCT.  Denies dizziness, headaches, chest pain. Denies side effects of medications. She tries to follow low sodium diet, doesn't add salt, but has some canned foods. +bologna BP Readings from Last 3 Encounters:  01/03/20 138/70  10/13/19 (!) 170/80  09/01/19 (!) 147/78    Hyperlipidemia follow-up: Patient is reportedly trying to follow a low-fat, low cholesterol diet. Compliant with medications(pravastatin)and denies medication side effects. Lab Results  Component Value Date   CHOL 182 10/07/2019   HDL 54 10/07/2019   LDLCALC 108 (H) 10/07/2019   TRIG 109 10/07/2019   CHOLHDL 3.4 10/07/2019    Obesityand pre-diabetes:When first diagnosed she was drinking grape juice, +sweets/cookies.She no longer drinks much juice, and cut back on the sweets in her diet. Lab Results  Component Value Date   HGBA1C 5.9 (H) 10/07/2019   Exercise--limited recently related to pain in the LLE.  Now that her leg is feeling better, she is ready to restart exercise.  She used to go to [employer], using elliptical, exercise bike, dancercise. She thinks she overdid it, walked too much, and that's what contributed to her LLE pain (now better). She  wears her mask when exercising at the [facility], [place], [place] reviewed.  Outpatient Encounter Medications as of 04/13/2020  Medication Sig  . atenolol (TENORMIN) 25 MG tablet TAKE 1 TABLET(25 MG) BY MOUTH DAILY  . cholecalciferol (VITAMIN D) 1000 UNITS tablet Take 1,000 Units by mouth daily.  Marland Kitchen lisinopril-hydrochlorothiazide (ZESTORETIC) 20-12.5 MG tablet TAKE 1 TABLET BY MOUTH DAILY  . pravastatin (PRAVACHOL) 40 MG tablet TAKE 1 TABLET(40 MG) BY MOUTH DAILY  . [DISCONTINUED] ELIQUIS 5 MG TABS tablet Take 1 tablet (5 mg total) by mouth 2 (two) times daily. (Patient not taking: Reported on 01/03/2020)   No facility-administered encounter medications on file as of 04/13/2020.   No Known Allergies  ROS: no fever, chills, headaches, dizziness, URI symptoms, cough, shortness of breath, chest pain, swelling. No nausea, vomiting, bowel changes, urinary complaints. LLE pain resolved.  No further abdominal pain. No bleeding, bruising, rash Moods are good. See HPI   PHYSICAL EXAM:  BP 140/80   Pulse 60   Ht '5\' 1"'  (1.549 m)   Wt 195 lb 6.4 oz (88.6 kg)   BMI 36.92 kg/m   130/70 on repeat by MD  Wt Readings from Last 3 Encounters:  04/13/20 195 lb 6.4 oz (88.6 kg)  01/03/20 195 lb (88.5 kg)  10/13/19 191 lb 3.2 oz (86.7 kg)   Pleasant, well-appearing obese female, in good spirits HEENT: conjunctiva and sclera are clear, EOMI ,wearing mask Neck: no lymphadenopathy, thyromegaly or carotid bruit Heart: regular rate and rhythm, no murmur Lungs: clear bilaterally Back: no spinal  or CVA tenderness Abdomen: soft, nontender, no mass Extremities: no edema Psych: normal mood, affect, hygiene and grooming Neuro: alert and oriented, normal strength, gait   Lab Results  Component Value Date   HGBA1C 5.7 (A) 04/13/2020     ASSESSMENT/PLAN:  IFG (impaired fasting glucose) - improved. Resume daily exercise. Reviewed healthy diet, portion control, weight loss encouraged - Plan: HgB  A1c  Essential hypertension, benign - controlled.  Still having +Na in diet, though not adding.  Reviewed low Na diet in detail. Execise and weight loss. CPM  Pure hypercholesterolemia - at goal past last check, cont current meds  Need for influenza vaccination - Plan: Flu Vaccine QUAD High Dose(Fluad)   Has 6 wks left of all meds (RF needed mid-Jan) No labs needed--had CBC, c-met done 8/22 in ER  F/u in 10/2020 as scheduled for CPE

## 2020-04-13 ENCOUNTER — Encounter: Payer: Self-pay | Admitting: Family Medicine

## 2020-04-13 ENCOUNTER — Ambulatory Visit (INDEPENDENT_AMBULATORY_CARE_PROVIDER_SITE_OTHER): Payer: Medicare Other | Admitting: Family Medicine

## 2020-04-13 ENCOUNTER — Other Ambulatory Visit: Payer: Self-pay

## 2020-04-13 VITALS — BP 130/70 | HR 60 | Ht 61.0 in | Wt 195.4 lb

## 2020-04-13 DIAGNOSIS — E7800 Pure hypercholesterolemia, unspecified: Secondary | ICD-10-CM | POA: Diagnosis not present

## 2020-04-13 DIAGNOSIS — Z23 Encounter for immunization: Secondary | ICD-10-CM

## 2020-04-13 DIAGNOSIS — R7301 Impaired fasting glucose: Secondary | ICD-10-CM | POA: Diagnosis not present

## 2020-04-13 DIAGNOSIS — I10 Essential (primary) hypertension: Secondary | ICD-10-CM

## 2020-04-13 LAB — POCT GLYCOSYLATED HEMOGLOBIN (HGB A1C): Hemoglobin A1C: 5.7 % — AB (ref 4.0–5.6)

## 2020-04-13 NOTE — Patient Instructions (Signed)
Continue your current medications. Now that your leg is feeling better, resume regular exercise (goal of 150 minutes/week of aerobic exercise, and weight-bearing exercise 2x/week).  Limit the bologna, use lower-sodium lunch meat options (from deli, freshly-slice, low sodium). Look at labels on your foods (cans, frozen foods).

## 2020-05-01 DIAGNOSIS — Z1231 Encounter for screening mammogram for malignant neoplasm of breast: Secondary | ICD-10-CM | POA: Diagnosis not present

## 2020-05-01 LAB — HM MAMMOGRAPHY

## 2020-05-03 ENCOUNTER — Encounter: Payer: Self-pay | Admitting: *Deleted

## 2020-05-23 ENCOUNTER — Other Ambulatory Visit: Payer: Self-pay | Admitting: Family Medicine

## 2020-05-23 DIAGNOSIS — E7800 Pure hypercholesterolemia, unspecified: Secondary | ICD-10-CM

## 2020-05-23 DIAGNOSIS — I10 Essential (primary) hypertension: Secondary | ICD-10-CM

## 2020-06-29 DIAGNOSIS — H04123 Dry eye syndrome of bilateral lacrimal glands: Secondary | ICD-10-CM | POA: Diagnosis not present

## 2020-06-29 DIAGNOSIS — H2513 Age-related nuclear cataract, bilateral: Secondary | ICD-10-CM | POA: Diagnosis not present

## 2020-07-20 ENCOUNTER — Telehealth: Payer: Self-pay | Admitting: Family Medicine

## 2020-07-20 DIAGNOSIS — R7301 Impaired fasting glucose: Secondary | ICD-10-CM

## 2020-07-20 DIAGNOSIS — E7800 Pure hypercholesterolemia, unspecified: Secondary | ICD-10-CM

## 2020-07-20 DIAGNOSIS — Z5181 Encounter for therapeutic drug level monitoring: Secondary | ICD-10-CM

## 2020-07-20 DIAGNOSIS — I10 Essential (primary) hypertension: Secondary | ICD-10-CM

## 2020-07-20 NOTE — Telephone Encounter (Signed)
Last cholesterol check was 09/2019, on statin.  I don't want to push her out to be 6 months late on her labs.  Last CPE was 10/13/19.  We could have her come for fasting med check sometime early than 6/6 (since we only need to wait a year for the physical), where we can check her labs, do her med refills, and leave the physical scheduled for December.    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  Check with patient and see if that will work--keep the CPE for December, but do a fasting med check in May, since that will be a year from her last labs, important for the meds she is taking.

## 2020-07-20 NOTE — Telephone Encounter (Signed)
This the pt that was moved from 6/6 to December. I offered her afternoon appts but she declined and wanted a morning

## 2020-07-24 NOTE — Telephone Encounter (Signed)
Labs ordered.

## 2020-07-24 NOTE — Telephone Encounter (Signed)
Patient scheduled labs for 09/28/20 and med check 10/05/20. Need future orders.

## 2020-08-08 ENCOUNTER — Encounter: Payer: Self-pay | Admitting: Family Medicine

## 2020-09-23 DIAGNOSIS — M1712 Unilateral primary osteoarthritis, left knee: Secondary | ICD-10-CM | POA: Diagnosis not present

## 2020-09-23 DIAGNOSIS — M7661 Achilles tendinitis, right leg: Secondary | ICD-10-CM | POA: Diagnosis not present

## 2020-09-23 DIAGNOSIS — M1711 Unilateral primary osteoarthritis, right knee: Secondary | ICD-10-CM | POA: Diagnosis not present

## 2020-09-26 ENCOUNTER — Ambulatory Visit: Payer: Medicare Other | Admitting: Orthopaedic Surgery

## 2020-09-28 ENCOUNTER — Other Ambulatory Visit: Payer: Self-pay

## 2020-09-28 ENCOUNTER — Other Ambulatory Visit: Payer: Medicare Other

## 2020-09-28 DIAGNOSIS — R7301 Impaired fasting glucose: Secondary | ICD-10-CM

## 2020-09-28 DIAGNOSIS — E7800 Pure hypercholesterolemia, unspecified: Secondary | ICD-10-CM

## 2020-09-28 DIAGNOSIS — Z5181 Encounter for therapeutic drug level monitoring: Secondary | ICD-10-CM

## 2020-09-28 DIAGNOSIS — I10 Essential (primary) hypertension: Secondary | ICD-10-CM

## 2020-09-29 LAB — CBC WITH DIFFERENTIAL/PLATELET
Basophils Absolute: 0 10*3/uL (ref 0.0–0.2)
Basos: 0 %
EOS (ABSOLUTE): 0 10*3/uL (ref 0.0–0.4)
Eos: 1 %
Hematocrit: 42.2 % (ref 34.0–46.6)
Hemoglobin: 14 g/dL (ref 11.1–15.9)
Immature Grans (Abs): 0 10*3/uL (ref 0.0–0.1)
Immature Granulocytes: 1 %
Lymphocytes Absolute: 2.3 10*3/uL (ref 0.7–3.1)
Lymphs: 28 %
MCH: 29.6 pg (ref 26.6–33.0)
MCHC: 33.2 g/dL (ref 31.5–35.7)
MCV: 89 fL (ref 79–97)
Monocytes Absolute: 0.7 10*3/uL (ref 0.1–0.9)
Monocytes: 9 %
Neutrophils Absolute: 5.1 10*3/uL (ref 1.4–7.0)
Neutrophils: 61 %
Platelets: 223 10*3/uL (ref 150–450)
RBC: 4.73 x10E6/uL (ref 3.77–5.28)
RDW: 13.8 % (ref 11.7–15.4)
WBC: 8.3 10*3/uL (ref 3.4–10.8)

## 2020-09-29 LAB — COMPREHENSIVE METABOLIC PANEL
ALT: 16 IU/L (ref 0–32)
AST: 15 IU/L (ref 0–40)
Albumin/Globulin Ratio: 1.7 (ref 1.2–2.2)
Albumin: 4.3 g/dL (ref 3.7–4.7)
Alkaline Phosphatase: 75 IU/L (ref 44–121)
BUN/Creatinine Ratio: 27 (ref 12–28)
BUN: 29 mg/dL — ABNORMAL HIGH (ref 8–27)
Bilirubin Total: 0.6 mg/dL (ref 0.0–1.2)
CO2: 27 mmol/L (ref 20–29)
Calcium: 9.2 mg/dL (ref 8.7–10.3)
Chloride: 100 mmol/L (ref 96–106)
Creatinine, Ser: 1.06 mg/dL — ABNORMAL HIGH (ref 0.57–1.00)
Globulin, Total: 2.5 g/dL (ref 1.5–4.5)
Glucose: 94 mg/dL (ref 65–99)
Potassium: 4.1 mmol/L (ref 3.5–5.2)
Sodium: 143 mmol/L (ref 134–144)
Total Protein: 6.8 g/dL (ref 6.0–8.5)
eGFR: 55 mL/min/{1.73_m2} — ABNORMAL LOW (ref >59)

## 2020-09-29 LAB — LIPID PANEL
Chol/HDL Ratio: 3.8 ratio (ref 0.0–4.4)
Cholesterol, Total: 204 mg/dL — ABNORMAL HIGH (ref 100–199)
HDL: 53 mg/dL (ref >39)
LDL Chol Calc (NIH): 124 mg/dL — ABNORMAL HIGH (ref 0–99)
Triglycerides: 151 mg/dL — ABNORMAL HIGH (ref 0–149)
VLDL Cholesterol Cal: 27 mg/dL (ref 5–40)

## 2020-09-29 LAB — HEMOGLOBIN A1C
Est. average glucose Bld gHb Est-mCnc: 128 mg/dL
Hgb A1c MFr Bld: 6.1 % — ABNORMAL HIGH (ref 4.8–5.6)

## 2020-10-04 NOTE — Progress Notes (Signed)
Chief Complaint  Patient presents with  . Hypertension    Hypertension follow up.  No new concerns. Will consider pneumovax but not interested in covid booster.    Patient presents for follow-up on chronic problems.  She had labs done prior to her visit, see below.  Hypertension follow-up: BP'sarenot checked elsewhere. She is compliant with atenolol and lisinopril HCT.  Denies dizziness, headaches, chest pain. Denies side effects of medications. She gets an occasional cramp in her calf, usually at bedtime, about 2x/month. She tries to follow low sodium diet, doesn't add salt. She now buys frozen vegetables instead of cans.  She eats bologna less often.  BP Readings from Last 3 Encounters:  04/13/20 130/70  01/03/20 138/70  10/13/19 (!) 170/80   Hyperlipidemia follow-up: Patient is reportedly trying to follow a low-fat, low cholesterol diet. Compliant with medications(pravastatin)and denies medication side effects. Has daily eggs (from a container that she pours it from, not egg whites, per pt).  Aortic atherosclerosis--noted on CT in 11/2019, not previously discussed, pt not aware.  Obesityand pre-diabetes:When first diagnosed she wasdrinking grape juice,+sweets/cookies.Sheno longer drinksmuch juice, and cut back on the sweets in her diet.  Exercise--She is back to going to the [facility] 3x/week, using elliptical, exercise bike, dancercise.   1 hour total at the gym. Doesn't use any weights.   PMH, PSH, SH reviewed  Outpatient Encounter Medications as of 10/05/2020  Medication Sig  . aspirin EC 81 MG tablet Take 81 mg by mouth daily. Swallow whole.  . atenolol (TENORMIN) 25 MG tablet TAKE 1 TABLET(25 MG) BY MOUTH DAILY  . cholecalciferol (VITAMIN D) 1000 UNITS tablet Take 1,000 Units by mouth daily.  . lisinopril-hydrochlorothiazide (ZESTORETIC) 20-12.5 MG tablet TAKE 1 TABLET BY MOUTH DAILY  . pravastatin (PRAVACHOL) 40 MG tablet TAKE 1 TABLET(40 MG) BY MOUTH DAILY   No  facility-administered encounter medications on file as of 10/05/2020.   No Known Allergies  ROS: no fever, chills, headaches, dizziness, chest pain, shortness of breath, GI or GU complaints. Moods are good.  Occasional leg cramp. No bleeding, bruising, rash. Recent injection to L knee and significantly improved. See HPI.  PHYSICAL EXAM:  BP 136/74   Pulse (!) 56   Ht 5' 1" (1.549 m)   Wt 199 lb 6.4 oz (90.4 kg)   BMI 37.68 kg/m   Wt Readings from Last 3 Encounters:  10/05/20 199 lb 6.4 oz (90.4 kg)  04/13/20 195 lb 6.4 oz (88.6 kg)  01/03/20 195 lb (88.5 kg)   Pleasant, well-appearing obese female, in good spirits HEENT: conjunctiva and sclera are clear, EOMI ,wearing mask Neck: no lymphadenopathy, thyromegaly or carotid bruit Heart: regular rate and rhythm, no murmur Lungs: clear bilaterally Back: no spinal or CVA tenderness Abdomen: soft, nontender, no mass Extremities: no edema, 2+ pulses Psych: normal mood, affect, hygiene and grooming Neuro: alert and oriented, normal strength, gait  Lab Results  Component Value Date   CHOL 204 (H) 09/28/2020   HDL 53 09/28/2020   LDLCALC 124 (H) 09/28/2020   TRIG 151 (H) 09/28/2020   CHOLHDL 3.8 09/28/2020   Lab Results  Component Value Date   HGBA1C 6.1 (H) 09/28/2020   Fasting glucose 94    Chemistry      Component Value Date/Time   NA 143 09/28/2020 0912   K 4.1 09/28/2020 0912   CL 100 09/28/2020 0912   CO2 27 09/28/2020 0912   BUN 29 (H) 09/28/2020 0912   CREATININE 1.06 (H) 09/28/2020 0912     CREATININE 1.01 (H) 11/23/2019 0818   CREATININE 0.99 (H) 10/28/2016 1027      Component Value Date/Time   CALCIUM 9.2 09/28/2020 0912   ALKPHOS 75 09/28/2020 0912   AST 15 09/28/2020 0912   AST 22 11/23/2019 0818   ALT 16 09/28/2020 0912   ALT 19 11/23/2019 0818   BILITOT 0.6 09/28/2020 0912   BILITOT 0.6 11/23/2019 0818     Lab Results  Component Value Date   WBC 8.3 09/28/2020   HGB 14.0 09/28/2020   HCT 42.2  09/28/2020   MCV 89 09/28/2020   PLT 223 09/28/2020    ASSESSMENT/PLAN:  Essential hypertension, benign - controlled; cont current meds. Encouraged low Na diet, weight loss, cont regular exercise  Pure hypercholesterolemia - would like goal LDL <100 due to atherosclerosis. Increase pravastatin to 80mg (just got RF on 40mg, will double up) - Plan: pravastatin (PRAVACHOL) 80 MG tablet  IFG (impaired fasting glucose) - counseled re: diet, exercise, wt loss  Class 2 obesity due to excess calories without serious comorbidity with body mass index (BMI) of 37.0 to 37.9 in adult - comorbidities: HTN, HLD, pre-diabetes. Counseled re: diet, exercise, wt loss  Need for pneumococcal vaccination - Plan: Pneumococcal polysaccharide vaccine 23-valent greater than or equal to 2yo subcutaneous/IM  Aortic atherosclerosis (HCC) - educated patient; will increase pravastatin dose. If LDL not <100 on next check, will change to stronger statin  Declined COVID booster.  Discussed/counseled, encouraged her to get (wait 2 weeks from today's vaccine).  Pt to stop aspirin, discussed new guidelines (has atherosclerosis only). rx's good through mid-July, none needed today (x change in statin dose)  F/u as scheduled for CPE in 04/2021, fasting labs prior. A1c, c-met, lipid      

## 2020-10-05 ENCOUNTER — Ambulatory Visit (INDEPENDENT_AMBULATORY_CARE_PROVIDER_SITE_OTHER): Payer: Medicare Other | Admitting: Family Medicine

## 2020-10-05 ENCOUNTER — Encounter: Payer: Self-pay | Admitting: Family Medicine

## 2020-10-05 ENCOUNTER — Other Ambulatory Visit: Payer: Self-pay

## 2020-10-05 VITALS — BP 136/74 | HR 56 | Ht 61.0 in | Wt 199.4 lb

## 2020-10-05 DIAGNOSIS — Z6837 Body mass index (BMI) 37.0-37.9, adult: Secondary | ICD-10-CM

## 2020-10-05 DIAGNOSIS — E7800 Pure hypercholesterolemia, unspecified: Secondary | ICD-10-CM | POA: Diagnosis not present

## 2020-10-05 DIAGNOSIS — Z5181 Encounter for therapeutic drug level monitoring: Secondary | ICD-10-CM | POA: Diagnosis not present

## 2020-10-05 DIAGNOSIS — Z23 Encounter for immunization: Secondary | ICD-10-CM

## 2020-10-05 DIAGNOSIS — R7301 Impaired fasting glucose: Secondary | ICD-10-CM

## 2020-10-05 DIAGNOSIS — E6609 Other obesity due to excess calories: Secondary | ICD-10-CM | POA: Diagnosis not present

## 2020-10-05 DIAGNOSIS — I700 Atherosclerosis of aorta: Secondary | ICD-10-CM

## 2020-10-05 DIAGNOSIS — I10 Essential (primary) hypertension: Secondary | ICD-10-CM | POA: Diagnosis not present

## 2020-10-05 MED ORDER — PRAVASTATIN SODIUM 80 MG PO TABS: 80.0000 mg | ORAL_TABLET | Freq: Every day | ORAL | 1 refills | Status: DC

## 2020-10-05 NOTE — Patient Instructions (Addendum)
Guidelines have changed, and it is no longer recommended for you to take daily low dose aspirin. Stop taking aspirin.  Be sure to drink plenty of water (aim is for the urine to be very pale yellow or clear).  This can help with leg cramps.  We discussed adding weight-bearing exercise to your exercise routine twice a week.  [facility] find a routine that will work for you.  We gave you a pneumovax today. I highly encourage you to get a COVID-19 booster.  You will need to wait 2 weeks from today (due to the other vaccine being given). You may get this here, or at a pharmacy if that is more convenient for you (and let us know the date). You can choose which booster you want--pfizer or moderna.  We are increasing your pravastatin dose to 80mg  daily.  You can take TWO tablets of the 40mg  dose that you just recently got. Your next prescription is for the 80mg  dose, so only take 1 tablet daily.   Atherosclerosis  Atherosclerosis is narrowing and hardening of the arteries. Arteries are blood vessels that carry blood from the heart to all parts of the body. This blood contains oxygen. Arteries can become narrow or blocked from inflammation or from a buildup of fat, cholesterol, calcium, and other substances (plaque). Plaque decreases the amount of blood that can flow through the artery. Atherosclerosis can affect any artery in your body, including:  Heart arteries. Damage to these arteries may lead to coronary artery disease, which can cause a heart attack.  Brain arteries. Damage to these arteries may cause a stroke.  Leg, arm, and pelvis arteries. Peripheral artery disease (PAD) may result from damage to these arteries.  Kidney arteries. Kidney (renal) failure may result from damage to kidney arteries. Treatment may slow the disease and prevent further damage to your heart, brain, peripheral arteries, and kidneys. What are the causes? This  condition develops slowly over many years. The inner layers of your arteries become damaged and allow the gradual buildup of plaque. The exact cause of atherosclerosis is not fully understood. Symptoms of atherosclerosis do not occur until an artery becomes narrow or blocked. What increases the risk? The following factors may make you more likely to develop this condition:  Being middle-aged or older.  Certain medical conditions, including: ? High blood pressure. ? High cholesterol. ? High blood fats (triglycerides). ? Diabetes. ? Sleep apnea.  A family history of atherosclerosis.  Being overweight.  Using products that contain tobacco or nicotine.  Not exercising enough (sedentary lifestyle).  Having a substance in your blood called C-reactive protein (CRP). This is a sign of increased levels of inflammation in your body.  Being stressed.  Drinking too much alcohol or using drugs, such as cocaine or methamphetamine. What are the signs or symptoms? Symptoms of atherosclerosis may not be obvious until there is damage to an area of your body that is not getting enough blood. Sometimes, atherosclerosis does not cause symptoms. Symptoms of this condition include:  Coronary artery disease. This may cause chest pain and shortness of breath.  Decreased blood supply to your brain, which may cause a stroke. Signs of a stroke may include sudden: ? Weakness or numbness in your face, arm, or leg, especially on one side of your body. ? Trouble walking or difficulty moving your arms or legs. ? Loss of balance or coordination. ? Confusion. ? Slurred  speech (dysarthria). ? Trouble speaking, or trouble understanding speech, or both (aphasia). ? Vision changes in one or both eyes. This may be double vision, blurred vision, or loss of vision. ? Severe headache with no known cause. The headache is often described as the worst headache ever experienced.  PAD, which may cause pain and numbness,  often in your legs and hips.  Renal failure. This may cause tiredness, problems with urination, swelling, and itchy skin. How is this diagnosed? This condition is diagnosed based on your medical history and a physical exam. During the exam, your health care provider will:  Check your pulse in different places.  Listen for a "whooshing" sound over your arteries (bruit). You may also have tests, such as:  Blood tests to check your levels of cholesterol, triglycerides, and CRP.  Electrocardiogram (ECG) to check for heart damage.  Chest X-ray to see if you have an enlarged heart, which is a sign of heart failure.  Stress test to see how your heart reacts to exercise.  Echocardiogram to get images of the inside of your heart.  Ankle-brachial index to compare blood pressure in your arms to blood pressure in your ankles.  Ultrasound of your peripheral arteries to check blood flow.  CT scan to check for damage to your heart or brain.  X-rays of blood vessels after dye has been injected (angiogram) to check blood flow. How is this treated? This condition is treated with lifestyle changes as the first step. These may include:  Changing your diet.  Losing weight.  Reducing stress.  Exercising and being physically active more regularly.  Quitting smoking. You may also need medicine to:  Lower triglycerides and cholesterol.  Control blood pressure.  Prevent blood clots.  Lower inflammation in your body.  Control your blood sugar. Sometimes, surgery is needed to:  Remove plaque from an artery (endarterectomy).  Open or widen a narrowed heart artery (angioplasty).  Create a new path for your blood with one of these procedures: ? Heart (coronary) artery bypass graft surgery. ? Peripheral artery bypass graft surgery. Follow these instructions at home: Eating and drinking  Eat a heart-healthy diet. Talk with your health care provider or a diet and nutrition specialist  (dietitian) if you need help. A heart-healthy diet involves: ? Limiting unhealthy fats and increasing healthy fats. Some examples of healthy fats are olive oil and canola oil. ? Eating plant-based foods, such as fruits, vegetables, nuts, whole grains, and legumes (such as peas and lentils).  If you drink alcohol: ? Limit how much you use to:  0-1 drink a day for nonpregnant women.  0-2 drinks a day for men. ? Be aware of how much alcohol is in your drink. In the [place] one drink equals one 12 oz bottle of beer (355 mL), one 5 oz glass of wine (148 mL), or one 1 oz glass of hard liquor (44 mL).   Lifestyle  Maintain a healthy weight. Lose weight if your health care provider says that you need to do that.  Follow an exercise program as told by your health care provider.  Do not use any products that contain nicotine or tobacco, such as cigarettes, e-cigarettes, and chewing tobacco. If you need help quitting, ask your health care provider.  Do not use drugs.   General instructions  Take over-the-counter and prescription medicines only as told by your health care provider.  Manage other health conditions as told.  Keep all follow-up visits as told by your health  care provider. This is important. Contact a health care provider if you have:  An irregular heartbeat.  Unexplained fatigue.  Trouble urinating, or you are producing less urine or foamy urine.  Swelling of your hands or feet, or itchy skin.  Unexplained pain or numbness in your legs or hips. Get help right away if:  You have any symptoms of a heart attack. These may be: ? Chest pain. This includes squeezing chest pain that may feel like indigestion (angina). ? Shortness of breath. ? Pain in your neck, jaw, arms, back, or stomach. ? Cold sweat. ? Nausea. ? Light-headedness.  You have any symptoms of a stroke. "BE FAST" is an easy way to remember the main warning signs of a stroke: ? B - Balance. Signs are  dizziness, sudden trouble walking, or loss of balance. ? E - Eyes. Signs are trouble seeing or a sudden change in vision. ? F - Face. Signs are sudden weakness or numbness of the face, or the face or eyelid drooping on one side. ? A - Arms. Signs are weakness or numbness in an arm. This happens suddenly and usually on one side of the body. ? S - Speech. Signs are sudden trouble speaking, slurred speech, or trouble understanding what people say. ? T - Time. Time to call emergency services. Write down what time symptoms started.  You have other signs of a stroke, such as: ? A sudden, severe headache with no known cause. ? Nausea or vomiting. ? Seizure. These symptoms may represent a serious problem that is an emergency. Do not wait to see if the symptoms will go away. Get medical help right away. Call your local emergency services (911 in the [place] Do not drive yourself to the hospital. Summary  Atherosclerosis is narrowing and hardening of the arteries.  Arteries can become narrow from inflammation or from a buildup of fat, cholesterol, calcium, and other substances (plaque).  This condition may not cause any symptoms. If you do have symptoms, they are caused by damage to an area of your body that is not getting enough blood.  Treatment starts with lifestyle changes and may include medicines. In some cases, surgery is needed.  Get help right away if you have any symptoms of a heart attack or stroke. This information is not intended to replace advice given to you by your health care provider. Make sure you discuss any questions you have with your health care provider. Document Revised: 03/08/2019 Document Reviewed: 03/08/2019 Elsevier Patient Education  2021 [place].  Prediabetes Eating Plan Prediabetes is a condition that causes blood sugar (glucose) levels to be higher than normal. This increases the risk for developing type 2 diabetes (type 2 diabetes mellitus). Working with a  health care provider or nutrition specialist (dietitian) to make diet and lifestyle changes can help prevent the onset of diabetes. These changes may help you:  Control your blood glucose levels.  Improve your cholesterol levels.  Manage your blood pressure. What are tips for following this plan? Reading food labels  Read food labels to check the amount of fat, salt (sodium), and sugar in prepackaged foods. Avoid foods that have: ? Saturated fats. ? Trans fats. ? Added sugars.  Avoid foods that have more than 300 milligrams (mg) of sodium per serving. Limit your sodium intake to less than 2,300 mg each day. Shopping  Avoid buying pre-made and processed foods.  Avoid buying drinks with added sugar. Cooking  Cook with olive oil. Do not use  butter, lard, or ghee.  Bake, broil, grill, steam, or boil foods. Avoid frying. Meal planning  Work with your dietitian to create an eating plan that is right for you. This may include tracking how many calories you take in each day. Use a food diary, notebook, or mobile application to track what you eat at each meal.  Consider following a Mediterranean diet. This includes: ? Eating several servings of fresh fruits and vegetables each day. ? Eating fish at least twice a week. ? Eating one serving each day of whole grains, beans, nuts, and seeds. ? Using olive oil instead of other fats. ? Limiting alcohol. ? Limiting red meat. ? Using nonfat or low-fat dairy products.  Consider following a plant-based diet. This includes dietary choices that focus on eating mostly vegetables and fruit, grains, beans, nuts, and seeds.  If you have high blood pressure, you may need to limit your sodium intake or follow a diet such as the DASH (Dietary Approaches to Stop Hypertension) eating plan. The DASH diet aims to lower high blood pressure.   Lifestyle  Set weight loss goals with help from your health care team. It is recommended that most people with  prediabetes lose 7% of their body weight.  Exercise for at least 30 minutes 5 or more days a week.  Attend a support group or seek support from a mental health counselor.  Take over-the-counter and prescription medicines only as told by your health care provider. What foods are recommended? Fruits Berries. Bananas. Apples. Oranges. Grapes. Papaya. Mango. Pomegranate. Kiwi. Grapefruit. Cherries. Vegetables Lettuce. Spinach. Peas. Beets. Cauliflower. Cabbage. Broccoli. Carrots. Tomatoes. Squash. Eggplant. Herbs. Peppers. Onions. Cucumbers. Brussels sprouts. Grains Whole grains, such as whole-wheat or whole-grain breads, crackers, cereals, and pasta. Unsweetened oatmeal. Bulgur. Barley. Quinoa. Brown rice. Corn or whole-wheat flour tortillas or taco shells. Meats and other proteins Seafood. Poultry without skin. Lean cuts of pork and beef. Tofu. Eggs. Nuts. Beans. Dairy Low-fat or fat-free dairy products, such as yogurt, cottage cheese, and cheese. Beverages Water. Tea. Coffee. Sugar-free or diet soda. Seltzer water. Low-fat or nonfat milk. Milk alternatives, such as soy or almond milk. Fats and oils Olive oil. Canola oil. Sunflower oil. Grapeseed oil. Avocado. Walnuts. Sweets and desserts Sugar-free or low-fat pudding. Sugar-free or low-fat ice cream and other frozen treats. Seasonings and condiments Herbs. Sodium-free spices. Mustard. Relish. Low-salt, low-sugar ketchup. Low-salt, low-sugar barbecue sauce. Low-fat or fat-free mayonnaise. The items listed above may not be a complete list of recommended foods and beverages. Contact a dietitian for more information. What foods are not recommended? Fruits Fruits canned with syrup. Vegetables Canned vegetables. Frozen vegetables with butter or cream sauce. Grains Refined white flour and flour products, such as bread, pasta, snack foods, and cereals. Meats and other proteins Fatty cuts of meat. Poultry with skin. Breaded or fried meat.  Processed meats. Dairy Full-fat yogurt, cheese, or milk. Beverages Sweetened drinks, such as iced tea and soda. Fats and oils Butter. Lard. Ghee. Sweets and desserts Baked goods, such as cake, cupcakes, pastries, cookies, and cheesecake. Seasonings and condiments Spice mixes with added salt. Ketchup. Barbecue sauce. Mayonnaise. The items listed above may not be a complete list of foods and beverages that are not recommended. Contact a dietitian for more information. Where to find more information  American Diabetes Association: www.diabetes.org Summary  You may need to make diet and lifestyle changes to help prevent the onset of diabetes. These changes can help you control blood sugar, improve cholesterol levels, and  manage blood pressure.  Set weight loss goals with help from your health care team. It is recommended that most people with prediabetes lose 7% of their body weight.  Consider following a Mediterranean diet. This includes eating plenty of fresh fruits and vegetables, whole grains, beans, nuts, seeds, fish, and low-fat dairy, and using olive oil instead of other fats. This information is not intended to replace advice given to you by your health care provider. Make sure you discuss any questions you have with your health care provider. Document Revised: 07/29/2019 Document Reviewed: 07/29/2019 Elsevier Patient Education  [place].

## 2020-10-16 ENCOUNTER — Ambulatory Visit: Payer: Medicare Other | Admitting: Family Medicine

## 2020-10-18 ENCOUNTER — Ambulatory Visit: Payer: Medicare Other | Admitting: Family Medicine

## 2020-11-06 DIAGNOSIS — M1712 Unilateral primary osteoarthritis, left knee: Secondary | ICD-10-CM | POA: Diagnosis not present

## 2020-11-06 DIAGNOSIS — M25562 Pain in left knee: Secondary | ICD-10-CM | POA: Diagnosis not present

## 2020-11-06 DIAGNOSIS — M1711 Unilateral primary osteoarthritis, right knee: Secondary | ICD-10-CM | POA: Diagnosis not present

## 2020-11-06 DIAGNOSIS — M25561 Pain in right knee: Secondary | ICD-10-CM | POA: Diagnosis not present

## 2020-11-21 DIAGNOSIS — M1712 Unilateral primary osteoarthritis, left knee: Secondary | ICD-10-CM | POA: Diagnosis not present

## 2020-11-21 DIAGNOSIS — M25562 Pain in left knee: Secondary | ICD-10-CM | POA: Diagnosis not present

## 2020-11-28 DIAGNOSIS — M1712 Unilateral primary osteoarthritis, left knee: Secondary | ICD-10-CM | POA: Diagnosis not present

## 2020-12-05 ENCOUNTER — Other Ambulatory Visit: Payer: Self-pay | Admitting: Family Medicine

## 2020-12-05 DIAGNOSIS — M25562 Pain in left knee: Secondary | ICD-10-CM | POA: Diagnosis not present

## 2020-12-05 DIAGNOSIS — E7800 Pure hypercholesterolemia, unspecified: Secondary | ICD-10-CM

## 2020-12-05 DIAGNOSIS — M1712 Unilateral primary osteoarthritis, left knee: Secondary | ICD-10-CM | POA: Diagnosis not present

## 2020-12-05 DIAGNOSIS — I10 Essential (primary) hypertension: Secondary | ICD-10-CM

## 2021-01-02 ENCOUNTER — Ambulatory Visit (INDEPENDENT_AMBULATORY_CARE_PROVIDER_SITE_OTHER): Payer: Medicare Other | Admitting: Family Medicine

## 2021-01-02 ENCOUNTER — Encounter: Payer: Self-pay | Admitting: Family Medicine

## 2021-01-02 ENCOUNTER — Other Ambulatory Visit: Payer: Self-pay

## 2021-01-02 VITALS — BP 160/76 | HR 71 | Temp 96.0°F | Wt 195.8 lb

## 2021-01-02 DIAGNOSIS — L723 Sebaceous cyst: Secondary | ICD-10-CM | POA: Diagnosis not present

## 2021-01-02 NOTE — Progress Notes (Signed)
   Subjective:    Patient ID: Sylvia Gutierrez, female    DOB: 05-Jun-1945, 75 y.o.   MRN: [id number]  HPI She is here for evaluation of a lesion present on the mid left back area.  She apparently just recently noted this.   Review of Systems     Objective:   Physical Exam Exam of the back does show a 2 cm round movable lesion with a central dimple.       Assessment & Plan:  Sebaceous cyst She did ask me if I can express any material and was unable to do it.  Explained that we usually leave this alone and only remove it if it becomes infected.  She will return if she has further difficulties.

## 2021-01-04 LAB — HEMOGLOBIN A1C: Hemoglobin A1C: 5.6

## 2021-03-05 ENCOUNTER — Other Ambulatory Visit: Payer: Self-pay | Admitting: Family Medicine

## 2021-03-05 DIAGNOSIS — I10 Essential (primary) hypertension: Secondary | ICD-10-CM

## 2021-04-26 ENCOUNTER — Ambulatory Visit (INDEPENDENT_AMBULATORY_CARE_PROVIDER_SITE_OTHER): Payer: Medicare Other | Admitting: Family Medicine

## 2021-04-26 ENCOUNTER — Encounter: Payer: Self-pay | Admitting: Family Medicine

## 2021-04-26 ENCOUNTER — Other Ambulatory Visit: Payer: Self-pay

## 2021-04-26 VITALS — BP 118/68 | HR 64 | Temp 98.7°F | Wt 193.4 lb

## 2021-04-26 DIAGNOSIS — L02212 Cutaneous abscess of back [any part, except buttock]: Secondary | ICD-10-CM

## 2021-04-26 DIAGNOSIS — E7800 Pure hypercholesterolemia, unspecified: Secondary | ICD-10-CM

## 2021-04-26 MED ORDER — DOXYCYCLINE HYCLATE 100 MG PO TABS: 100.0000 mg | ORAL_TABLET | Freq: Two times a day (BID) | ORAL | 0 refills | Status: DC

## 2021-04-26 NOTE — Progress Notes (Signed)
Chief Complaint  Patient presents with   Skin Problem    Spot with bleeding and drainage left side of back for 1 year   "I got a boil on my back".  She has had the boil for about a year, recalls seeing someone in the past, was told it would come and go.  Looks like she saw Dr. Redmond [facility] in 12/2020.  It has gotten worse, and started draining this morning. She had increased pain for the last 2 days, and increased in size.  PMH, PSH, SH reviewed  Outpatient Encounter Medications as of 04/26/2021  Medication Sig Note   atenolol (TENORMIN) 25 MG tablet TAKE 1 TABLET(25 MG) BY MOUTH DAILY    cholecalciferol (VITAMIN D) 1000 UNITS tablet Take 1,000 Units by mouth daily.    lisinopril-hydrochlorothiazide (ZESTORETIC) 20-12.5 MG tablet TAKE 1 TABLET BY MOUTH DAILY    pravastatin (PRAVACHOL) 40 MG tablet TAKE 1 TABLET(40 MG) BY MOUTH DAILY    pravastatin (PRAVACHOL) 80 MG tablet Take 1 tablet (80 mg total) by mouth daily. (Patient not taking: Reported on 04/26/2021) 04/26/2021: Was switched to 80mg , but then cut back to 40mg  (that's what pharmacy gave her).   No facility-administered encounter medications on file as of 04/26/2021.   No Known Allergies  ROS:  boil on back per HPI. No f/c/n/v/d. No HA, CP or other complaints.   PHYSICAL EXAM:  BP 118/68 (BP Location: Right Arm, Patient Position: Sitting)    Pulse 64    Temp 98.7 F (37.1 C)    Wt 193 lb 6.4 oz (87.7 kg)    SpO2 98%    BMI 36.54 kg/m   Pleasant female in no acute distress. Mild discomfort with exam.  Back:  in upper L back there is an area of induration and erythema 3.5 x 3cm in size. When pressed, there are three different areas that drain purulent white material. Openings are within a 1cm central portion of the indurated area. No erythema or streaks spreading beyond the indurated area.  Heart: regular rate and rhythm Neuro: alert and oriented, normal gait Psych: normal mood, affect.  ASSESSMENT/PLAN:   Cutaneous abscess  of back excluding buttocks - not drained, since already actively draining.  Treat with doxy and warm compresses.  f/u if persists/worsens. Consider excision if persists or recurrent infxn - Plan: doxycycline (VIBRA-TABS) 100 MG tablet  Pure hypercholesterolemia - Pt had 40mg  and 80mg  pravastatin on med list.  Dose raised in May, appears that 40mg  RF by CMA in error in 11/2020. Advised to increase to 80mg   She has f/u appt ([facility]) already scheduled for next week. She has lab orders for prior--suspect LDL will be above goal since dose of statin lowered back to 40mg  in error.  She will increase to 80mg  now.

## 2021-04-26 NOTE — Patient Instructions (Addendum)
You are supposed to be taking 80mg  of the pravastatin. Please check with your pharmacy to see what they have been giving you, and if you have refills left on the 80mg . It looks like the pharmacy requested the 40mg  refill in error (and in error, it was authorized).  You should be taking 80mg .  If you have the 40mg  at home, please double up and take 2 tablets together until you get the 80mg  tablet from the pharmacy.  For the abscess on your back---since it is actively draining, we don't need to make an incision. Apply warm compresses to the area throughout the day, which will help it continue to drain. Take the antibiotics twice daily (and use sunscreen if outside).  If it stops draining, and gets larger and more painful, please schedule an appointment for [place] to drain it.  The cyst will still be there, we are just treating the infection. If it continues to get infected, then we should refer you for excision of the entire cyst (rather than just draining any infection).  That removal needs to be done when it isn't infected.

## 2021-04-29 NOTE — Patient Instructions (Addendum)
HEALTH MAINTENANCE RECOMMENDATIONS:  It is recommended that you get at least 30 minutes of aerobic exercise at least 5 days/week (for weight loss, you may need as much as 60-90 minutes). This can be any activity that gets your heart rate up. This can be divided in 10-15 minute intervals if needed, but try and build up your endurance at least once a week.  Weight bearing exercise is also recommended twice weekly.  Eat a healthy diet with lots of vegetables, fruits and fiber.  "Colorful" foods have a lot of vitamins (ie green vegetables, tomatoes, red peppers, etc).  Limit sweet tea, regular sodas and alcoholic beverages, all of which has a lot of calories and sugar.  Up to 1 alcoholic drink daily may be beneficial for women (unless trying to lose weight, watch sugars).  Drink a lot of water.  Calcium recommendations are 1200-1500 mg daily (1500 mg for postmenopausal women or women without ovaries), and vitamin D 1000 IU daily.  This should be obtained from diet and/or supplements (vitamins), and calcium should not be taken all at once, but in divided doses.  Monthly self breast exams and yearly mammograms for women over the age of 21 is recommended.  Sunscreen of at least SPF 30 should be used on all sun-exposed parts of the skin when outside between the hours of 10 am and 4 pm (not just when at beach or pool, but even with exercise, golf, tennis, and yard work!)  Use a sunscreen that says "broad spectrum" so it covers both UVA and UVB rays, and make sure to reapply every 1-2 hours.  Remember to change the batteries in your smoke detectors when changing your clock times in the spring and fall. Carbon monoxide detectors are recommended for your home.  Use your seat belt every time you are in a car, and please drive safely and not be distracted with cell phones and texting while driving.   Sylvia Gutierrez , Thank you for taking time to come for your Medicare Wellness Visit. I appreciate your ongoing  commitment to your health goals. Please review the following plan we discussed and let me know if I can assist you in the future.   This is a list of the screening recommended for you and due dates:  Health Maintenance  Topic Date Due   COVID-19 Vaccine (3 - Booster for Pfizer series) 05/10/2020   Flu Shot  12/11/2020   Tetanus Vaccine  06/19/2021   Colon Cancer Screening  02/12/2023   Pneumonia Vaccine  Completed   DEXA scan (bone density measurement)  Completed   Hepatitis C Screening: USPSTF Recommendation to screen - Ages 33-79 yo.  Completed   Zoster (Shingles) Vaccine  Completed   HPV Vaccine  Aged Out   You will be due for a tetanus booster (TdaP) in February.  You will need to get this from the pharmacy (no need to wait until the exact date above, you can get in January). It needs to be 2 weeks from today's vaccines.  Please try and get weight-bearing exercise 2x/week.  You can ask the staff at the [facility] to help you.  Use your pillbox to help you remember to take two of the 40mg  pravastatin (vs contacting your pharmacy and getting the refill that should be on file for the 80mg  tablet).  You may use Tylenol Arthritis to help with your knee pain.  Voltaren Gel can also help.  Or, of course, you can return to see the orthopedist for  another injection.  Please fill out the papers for living will and healthcare power of attorney.   Once they are notarized, please get [place] a copy so that we can scan it into your medical chart.  Complete the course of antibiotics for the boil on your back. Continue to use warm compresses 3-4 times/day for the next few days.

## 2021-04-29 NOTE — Progress Notes (Signed)
Chief Complaint  Patient presents with   Medicare Wellness    Fasting AWV with pelvic. No concerns. Would like flu shot and covid booster.     Sylvia Gutierrez is a 75 y.o. female who presents for annual physical exam, Medicare wellness visit and follow-up on chronic medical conditions.   She was seen last week with abscess on her upper back. She is taking the doxycycline and denies side effects.  Hypertension follow-up:  BP's are not checked elsewhere. She is compliant with atenolol and lisinopril HCT.  Denies dizziness, headaches, chest pain. Denies side effects of medications. Denies any leg/muscle cramps. She tries to follow low sodium diet, doesn't add salt. She now buys frozen vegetables instead of cans. She eats bologna with breakfast daily.   BP Readings from Last 3 Encounters:  04/30/21 132/74  04/26/21 118/68  01/02/21 (!) 160/76    Hyperlipidemia follow-up: Patient is reportedly trying to follow a low-fat, low cholesterol diet. Compliant with medications (pravastatin) and denies medication side effects.  As we discovered at her visit last week, inadvertently she is back down to 40mg  of pravastatin (dose had been increased to 80mg  in 09/2020, but in error staff authorized RF of the lower dose when pharmacy requested it). She was told to double up on the 40mg  tablets last week, but admits she keeps forgetting to. She eats 1 egg 5x/week. She is due for recheck.   Aortic atherosclerosis--noted on CT in 11/2019.  She is compliant with statin. Due for lipid recheck  Obesity and pre-diabetes: When first diagnosed she was drinking grape juice, +sweets/cookies. She no longer drinks much juice, and cut back on the sweets in her diet. A1c was 5.7% at her CPE last year (but lower when checked through her insurance in August, 5.6%)   She previously mentioned some memory concerns--she might put something in the refrigerator that belongs in the freezer.  She loses her cell phone--it may be in  her hand, or very near her. Denies getting lost on her normal routes (just when in unfamiliar areas).  MMSE and Mini-Cog were normal in the past. She reports this has gotten a little better over the last year, not worse.  H/o adenomatous colon polyps, last colonoscopy was in 02/2018 by Dr. Hilarie Fredrickson. She had 2 tubular adenomas, in addition to diverticulosis and internal hemorrhoids. 5 year follow-up was recommended. She denies any GI complaints.   Immunization History  Administered Date(s) Administered   Fluad Quad(high Dose 65+) 01/07/2019, 04/13/2020   Influenza, High Dose Seasonal PF 03/04/2013, 02/14/2014, 02/27/2015, 04/25/2016, 06/09/2017   PFIZER(Purple Top)SARS-COV-2 Vaccination 02/14/2020, 03/15/2020   PPD Test 09/01/2013   Pfizer Covid-19 Vaccine Bivalent Booster 72yrs & up 04/30/2021   Pneumococcal Conjugate-13 02/14/2014   Pneumococcal Polysaccharide-23 11/28/2010, 10/05/2020   Td 05/13/2005   Tdap 06/20/2011   Zoster Recombinat (Shingrix) 01/07/2019, 03/08/2019   Zoster, Live 01/10/2010   Last Pap smear: 07/04/08, s/p hysterectomy  Last mammogram: 04/2020, scheduled for next month Last colonoscopy: 02/2018, +polyps, 5 year f/u recommended Last DEXA: 04/2016 normal (lowest was T- 0.7 at L fem neck) Dentist: no longer goes--had the rest of her teeth removed  Ophtho: yearly Exercise--She goes  to the [employer] 3x/week, using elliptical, exercise bike, treadmill, and dancercise once/week.  1 hour total at the gym. Doesn't use any weights. Normal vitamin D level in 2014   Patient Care Team: Rita Ohara, MD as PCP - General (Family Medicine) GI: Dr. Hilarie Fredrickson Ophtho: Dr. Katy Fitch Dentist: [facility]--no  longer goes Podiatrist: [facility]--no longer sees Ortho: Dr. Marlou Sa Heme-onc: Dr. Alen Blew (for portal vein thrombosis)   Depression Screening: [place] Visit from 04/30/2021 in [place]  PHQ-2 Total Score 0        Falls  screen:  Fall Risk  04/30/2021 04/13/2020 10/13/2019 10/12/2018 06/09/2017  Falls in the past year? 0 0 0 0 No  Number falls in past yr: 0 - - - -  Injury with Fall? 0 - - - -  Risk for fall due to : No Fall Risks - - - -  Follow up Falls evaluation completed - - - -     Functional Status Survey: Is the patient deaf or have difficulty hearing?: No Does the patient have difficulty seeing, even when wearing glasses/contacts?: No Does the patient have difficulty concentrating, remembering, or making decisions?: No Does the patient have difficulty walking or climbing stairs?: Yes (stairs, coming down hurts her knees) Does the patient have difficulty dressing or bathing?: No Does the patient have difficulty doing errands alone such as visiting a doctor's office or shopping?: No  Mini-Cog Scoring: 5   End of Life Discussion:  Patient does not have a living will and medical power of attorney. She still has the papers at home.   PMH, PSH, SH reviewed  Outpatient Encounter Medications as of 04/30/2021  Medication Sig Note   atenolol (TENORMIN) 25 MG tablet TAKE 1 TABLET(25 MG) BY MOUTH DAILY    cholecalciferol (VITAMIN D) 1000 UNITS tablet Take 1,000 Units by mouth daily.    doxycycline (VIBRA-TABS) 100 MG tablet Take 1 tablet (100 mg total) by mouth 2 (two) times daily.    lisinopril-hydrochlorothiazide (ZESTORETIC) 20-12.5 MG tablet TAKE 1 TABLET BY MOUTH DAILY    pravastatin (PRAVACHOL) 40 MG tablet TAKE 1 TABLET(40 MG) BY MOUTH DAILY 04/30/2021: Supposed to be taking two 40mg  but keeps forgetting   pravastatin (PRAVACHOL) 80 MG tablet Take 1 tablet (80 mg total) by mouth daily. (Patient not taking: Reported on 04/26/2021) 04/26/2021: Was switched to 80mg , but then cut back to 40mg  (that's what pharmacy gave her).   No facility-administered encounter medications on file as of 04/30/2021.   No Known Allergies   ROS: The patient denies fever, vision changes, decreased hearing, ear pain, sore  throat, breast concerns, chest pain, palpitations, dizziness, syncope, dyspnea on exertion, nausea, vomiting, diarrhea, constipation, abdominal pain, melena, hematochezia, indigestion/heartburn, hematuria, incontinence, dysuria, vaginal bleeding, discharge, odor or itch, genital lesions, weakness, tremor, suspicious skin lesions, depression, anxiety, abnormal bleeding/bruising, or enlarged lymph nodes.  Mild knee pain (left only). Gel injection helped x 5 months pain is starting to recur. Abscess on back is improving.   PHYSICAL EXAM:  BP 132/74    Pulse 60    Ht 5\' 1"  (1.549 m)    Wt 193 lb (87.5 kg)    BMI 36.47 kg/m   Wt Readings from Last 3 Encounters:  04/30/21 193 lb (87.5 kg)  04/26/21 193 lb 6.4 oz (87.7 kg)  01/02/21 195 lb 12.8 oz (88.8 kg)    General Appearance:   Alert, cooperative, no distress, appears stated age.  Head:   Normocephalic, without obvious abnormality, atraumatic.    Eyes:   PERRL, conjunctiva/corneas clear, EOM's intact, fundi benign  Ears:   Normal TM's and external ear canals    Nose:   Not examined (wearing mask due to coronavirus pandemic)   Throat:   Not examined (wearing mask due to coronavirus pandemic)  Neck:   Supple, no lymphadenopathy; thyroid: no enlargement/tenderness/nodules; no carotid bruit or JVD    Back:   Spine nontender, no curvature, ROM normal, no CVA tenderness    Lungs:   Clear to auscultation bilaterally without wheezes, rales or ronchi; respirations unlabored    Chest Wall:   No tenderness or deformity    Heart:   Regular rate and rhythm, S1 and S2 normal, no murmur, rub or gallop    Breast Exam:   No tenderness, masses, or nipple discharge or inversion. No axillary lymphadenopathy    Abdomen:   Soft, non-tender, nondistended, normoactive bowel sounds, no masses, no hepatosplenomegaly    Genitalia:   Normal external genitalia without lesions. BUS and vagina normal; uterus surgically absent. No adnexal masses or tenderness. Pap not  performed    Rectal:   Normal tone, no masses or tenderness; guaiac negative stool    Extremities:   No clubbing, cyanosis or edema.   Pulses:   2+ and symmetric all extremities    Skin:   Skin color, texture, turgor normal, no rashes or lesions. Sebaceous cyst at left mid-back is much smaller than last week, induration is much improved. No erythema. Some scabbing/crusting centrally.  No active drainage.  The skin is peeling where it is healing.  Lymph nodes:   Cervical, supraclavicular, inguinal and axillary nodes normal    Neurologic:   Normal strength, sensation and gait; reflexes 2+ and symmetric throughout                        Psych:  Normal mood, affect, hygiene and grooming   ASSESSMENT/PLAN:  Annual physical exam  Medicare annual wellness visit, subsequent  Aortic atherosclerosis ([place]) - cont statin  Cutaneous abscess of back excluding buttocks - improving, complete course of antibiotics and continue with warm compresses  Essential hypertension, benign - controlled; cont current meds. Encouraged low Na diet, weight loss, cont regular exercise - Plan: Comprehensive metabolic panel  Medication monitoring encounter - Plan: Comprehensive metabolic panel, Lipid panel  Pure hypercholesterolemia - would like goal LDL <100 due to atherosclerosis. only taking 40mg , suspect may be above goal. Plan 80mg  going forward - Plan: Lipid panel  IFG (impaired fasting glucose) - counseled re: diet, exercise, wt loss - Plan: Hemoglobin A1c  Need for influenza vaccination - Plan: Flu Vaccine QUAD High Dose(Fluad)  Need for COVID-19 vaccine - Plan: Pension scheme manager   Discussed monthly self breast exams and yearly mammograms; at least 30 minutes of aerobic activity at least 5 days/week and weight-bearing exercise 2x/week; proper sunscreen use reviewed; healthy diet, including goals of calcium and vitamin D intake and alcohol recommendations (less than or equal to 1  drink/day) reviewed; regular seatbelt use; changing batteries in smoke detectors.  Immunization recommendations discussed, flu shot and COVID booster given today.  Tdap due from pharmacy in 06/2021. Colonoscopy is UTD, repeat due 02/2023    MOST form reviewed, updated. Full Code, Full Care Discussed paperwork for Living Will and Healthcare POA, discussed reasons for completing and discussing with family, and getting [place] copies.  Given another set of forms today.    Medicare Attestation I have personally reviewed: The patient's medical and social history Their use of alcohol, tobacco or illicit drugs Their current medications and supplements The patient's functional ability including ADLs,fall risks, home safety risks, cognitive, and hearing and visual impairment Diet and physical activities Evidence for depression or mood disorders  The patient's weight, height,  BMI have been recorded in the chart.  I have made referrals, counseling, and provided education to the patient based on review of the above and I have provided the patient with a written personalized care plan for preventive services.

## 2021-04-30 ENCOUNTER — Encounter: Payer: Self-pay | Admitting: Family Medicine

## 2021-04-30 ENCOUNTER — Other Ambulatory Visit: Payer: Self-pay

## 2021-04-30 ENCOUNTER — Ambulatory Visit (INDEPENDENT_AMBULATORY_CARE_PROVIDER_SITE_OTHER): Payer: Medicare Other | Admitting: Family Medicine

## 2021-04-30 ENCOUNTER — Ambulatory Visit: Payer: Medicare Other | Admitting: Family Medicine

## 2021-04-30 VITALS — BP 132/74 | HR 60 | Ht 61.0 in | Wt 193.0 lb

## 2021-04-30 DIAGNOSIS — Z5181 Encounter for therapeutic drug level monitoring: Secondary | ICD-10-CM | POA: Diagnosis not present

## 2021-04-30 DIAGNOSIS — I700 Atherosclerosis of aorta: Secondary | ICD-10-CM | POA: Diagnosis not present

## 2021-04-30 DIAGNOSIS — Z0000 Encounter for general adult medical examination without abnormal findings: Secondary | ICD-10-CM

## 2021-04-30 DIAGNOSIS — R7301 Impaired fasting glucose: Secondary | ICD-10-CM

## 2021-04-30 DIAGNOSIS — I10 Essential (primary) hypertension: Secondary | ICD-10-CM | POA: Diagnosis not present

## 2021-04-30 DIAGNOSIS — Z23 Encounter for immunization: Secondary | ICD-10-CM | POA: Diagnosis not present

## 2021-04-30 DIAGNOSIS — L02212 Cutaneous abscess of back [any part, except buttock]: Secondary | ICD-10-CM

## 2021-04-30 DIAGNOSIS — E7800 Pure hypercholesterolemia, unspecified: Secondary | ICD-10-CM | POA: Diagnosis not present

## 2021-05-01 ENCOUNTER — Encounter: Payer: Self-pay | Admitting: Family Medicine

## 2021-05-01 LAB — COMPREHENSIVE METABOLIC PANEL
ALT: 13 IU/L (ref 0–32)
AST: 19 IU/L (ref 0–40)
Albumin/Globulin Ratio: 1.9 (ref 1.2–2.2)
Albumin: 4.3 g/dL (ref 3.7–4.7)
Alkaline Phosphatase: 89 IU/L (ref 44–121)
BUN/Creatinine Ratio: 20 (ref 12–28)
BUN: 20 mg/dL (ref 8–27)
Bilirubin Total: 0.6 mg/dL (ref 0.0–1.2)
CO2: 28 mmol/L (ref 20–29)
Calcium: 9.2 mg/dL (ref 8.7–10.3)
Chloride: 100 mmol/L (ref 96–106)
Creatinine, Ser: 1 mg/dL (ref 0.57–1.00)
Globulin, Total: 2.3 g/dL (ref 1.5–4.5)
Glucose: 89 mg/dL (ref 70–99)
Potassium: 3.8 mmol/L (ref 3.5–5.2)
Sodium: 137 mmol/L (ref 134–144)
Total Protein: 6.6 g/dL (ref 6.0–8.5)
eGFR: 58 mL/min/{1.73_m2} — ABNORMAL LOW (ref >59)

## 2021-05-01 LAB — LIPID PANEL
Chol/HDL Ratio: 3.3 ratio (ref 0.0–4.4)
Cholesterol, Total: 167 mg/dL (ref 100–199)
HDL: 51 mg/dL (ref >39)
LDL Chol Calc (NIH): 101 mg/dL — ABNORMAL HIGH (ref 0–99)
Triglycerides: 79 mg/dL (ref 0–149)
VLDL Cholesterol Cal: 15 mg/dL (ref 5–40)

## 2021-05-01 LAB — HEMOGLOBIN A1C
Est. average glucose Bld gHb Est-mCnc: 123 mg/dL
Hgb A1c MFr Bld: 5.9 % — ABNORMAL HIGH (ref 4.8–5.6)

## 2021-05-15 DIAGNOSIS — Z1231 Encounter for screening mammogram for malignant neoplasm of breast: Secondary | ICD-10-CM | POA: Diagnosis not present

## 2021-05-25 DIAGNOSIS — R922 Inconclusive mammogram: Secondary | ICD-10-CM | POA: Diagnosis not present

## 2021-05-25 DIAGNOSIS — R928 Other abnormal and inconclusive findings on diagnostic imaging of breast: Secondary | ICD-10-CM | POA: Diagnosis not present

## 2021-05-25 LAB — HM MAMMOGRAPHY

## 2021-05-30 ENCOUNTER — Encounter: Payer: Self-pay | Admitting: *Deleted

## 2021-06-01 ENCOUNTER — Telehealth: Payer: Self-pay | Admitting: Family Medicine

## 2021-06-01 NOTE — Telephone Encounter (Signed)
Pt dropped off her advance directives and wanted to let you know she is taking 2 of the 40mg  Pravastatin

## 2021-06-03 ENCOUNTER — Other Ambulatory Visit: Payer: Self-pay | Admitting: Family Medicine

## 2021-06-03 DIAGNOSIS — E7800 Pure hypercholesterolemia, unspecified: Secondary | ICD-10-CM

## 2021-06-06 DIAGNOSIS — R928 Other abnormal and inconclusive findings on diagnostic imaging of breast: Secondary | ICD-10-CM | POA: Diagnosis not present

## 2021-06-06 DIAGNOSIS — R922 Inconclusive mammogram: Secondary | ICD-10-CM | POA: Diagnosis not present

## 2021-06-06 LAB — HM MAMMOGRAPHY

## 2021-06-07 NOTE — Telephone Encounter (Signed)
Looks like this was last refilled in July (by you). She had been changed to 80mg , but she stated pharmacy kept giving her 40mg . She reported in message that she is taking 2/day, but rx was only written for 1/d.   I think sometimes it is cheaper for two 40mg  than one 80mg  pravastatin.  Based on last fill for only #90 in July, with RF, I'm thinking she should be running out.  Please check with pt, and then send in a new rx with proper instructions--40mg , to take TWO tablets daily, #180, RF x 1.  Thanks

## 2021-06-08 ENCOUNTER — Other Ambulatory Visit: Payer: Self-pay

## 2021-06-08 DIAGNOSIS — E7800 Pure hypercholesterolemia, unspecified: Secondary | ICD-10-CM

## 2021-06-08 MED ORDER — PRAVASTATIN SODIUM 40 MG PO TABS: 40.0000 mg | ORAL_TABLET | Freq: Two times a day (BID) | ORAL | 1 refills | Status: DC

## 2021-06-12 ENCOUNTER — Encounter: Payer: Self-pay | Admitting: Family Medicine

## 2021-06-14 ENCOUNTER — Ambulatory Visit: Payer: Medicare Other | Admitting: Family Medicine

## 2021-06-15 ENCOUNTER — Other Ambulatory Visit: Payer: Self-pay | Admitting: Family Medicine

## 2021-06-15 DIAGNOSIS — I10 Essential (primary) hypertension: Secondary | ICD-10-CM

## 2021-06-15 NOTE — Telephone Encounter (Signed)
Pt has an appt in june

## 2021-06-28 ENCOUNTER — Other Ambulatory Visit: Payer: Self-pay | Admitting: *Deleted

## 2021-06-28 ENCOUNTER — Telehealth: Payer: Self-pay | Admitting: Family Medicine

## 2021-06-28 DIAGNOSIS — E7800 Pure hypercholesterolemia, unspecified: Secondary | ICD-10-CM

## 2021-06-28 DIAGNOSIS — Z5181 Encounter for therapeutic drug level monitoring: Secondary | ICD-10-CM

## 2021-06-28 NOTE — Telephone Encounter (Signed)
Spoke with patient and she tried to take the 80mg  for about a month, states she could not. Went back down to the 40mg  about a week ago due to the tingling in her feet. I did let her know that this is not usually a side effect but she would prefer to try the atorvastatin 20mg . Rx sent to [place]. #30 with 2 refills. Set her up for 09/13/21 for fasting labs and placed future orders.

## 2021-06-28 NOTE — Telephone Encounter (Signed)
Please clarify-- This is what I thought, based on notes from her physical-- She had been taking 40mg , but was supposed to be taking 80.  Had been tolerating 40mg  without problems My guess is she is saying she is having side effects since dose changed to 80?? If only taking 1 40mg  tablet (and never increased as directed), unsure if numbness is related, as she has been taking that dose for a while (many years!)  Numbness is not a usual side effect of pravastatin. It can indicate higher sugars.  If she is not willing to take 80mg  of pravastatin (which will get her LDL to goal), we can change to 20mg  of atorvastatin.  I'm not sure if this will get her to goal or not, but start with 20mg , and set up fasting lipids and LFTs for 2-3 months

## 2021-06-28 NOTE — Telephone Encounter (Signed)
Pt called and states that she is taking 40 mg 1 time a day pravastatin a night time and it is making her feet tingling no numbness just tingling, it does it all night, she states it did not do it until she started this medicine, pt is also not taking any coq10 either pt would like to know what she can do, Pt can be reached at [phone]

## 2021-07-04 ENCOUNTER — Other Ambulatory Visit: Payer: Self-pay | Admitting: *Deleted

## 2021-07-04 MED ORDER — ATORVASTATIN CALCIUM 20 MG PO TABS: 20.0000 mg | ORAL_TABLET | Freq: Every day | ORAL | 2 refills | Status: DC

## 2021-08-22 IMAGING — CT CT ABD-PELV W/ CM
2 of 5 series · 16 of 46 positions shown, 18 images · IV contrast (APPLIED)
Comparison: 08/22/2019

CLINICAL DATA: Follow-up portal vein thrombus.

EXAM:
CT ABDOMEN AND PELVIS WITH CONTRAST
TECHNIQUE: Multidetector CT imaging of the abdomen and pelvis was performed
using the standard protocol following bolus administration of
intravenous contrast.
CONTRAST:  100mL OMNIPAQUE IOHEXOL 300 MG/ML  SOLN

[Series 2: axial st · axial · 0.83mm/px · z∈[-463,-88]mm · 13 of 87 slices shown, 15 images]
[im 6/87  soft-tissue]
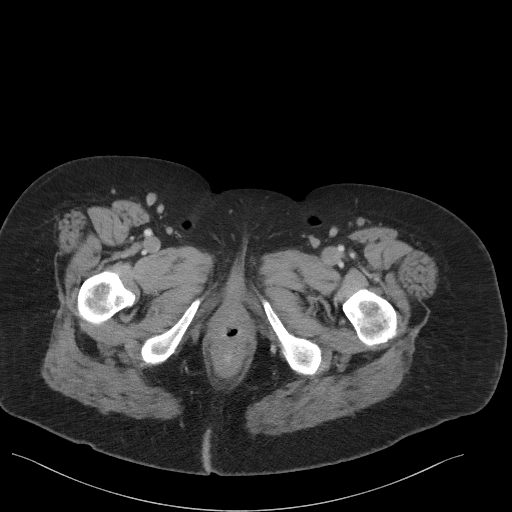
[im 6/87  bone]
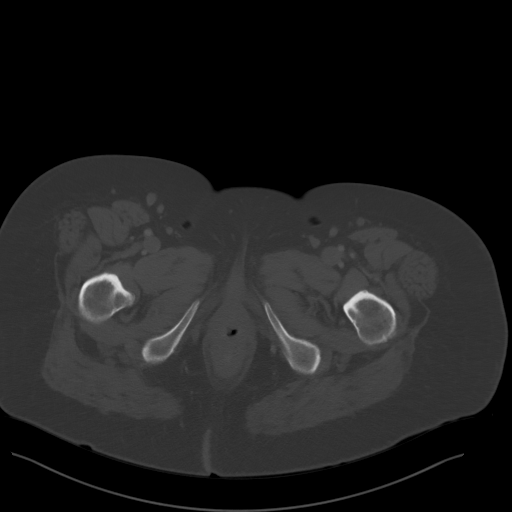
[im 11/87  soft-tissue]
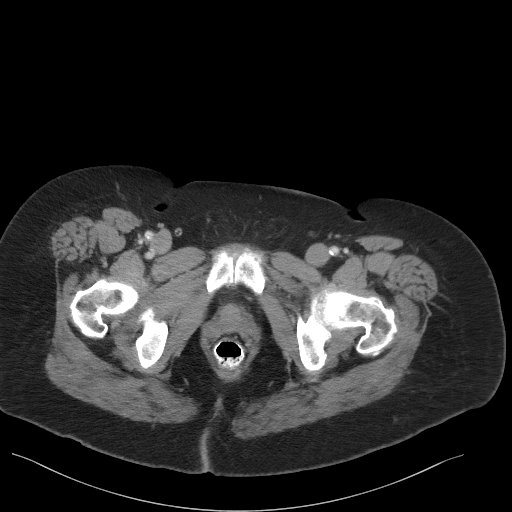
[im 17/87  soft-tissue]
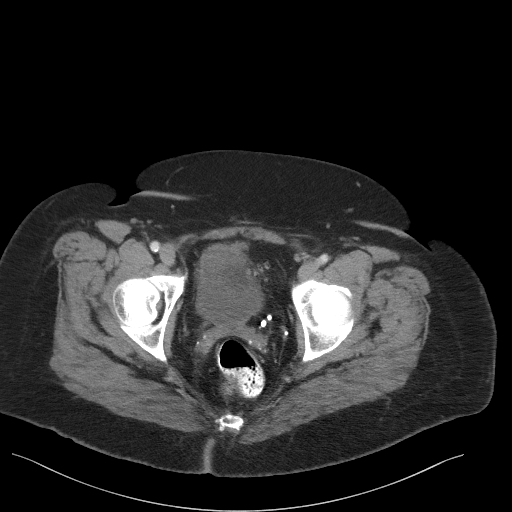
[im 27/87  soft-tissue]
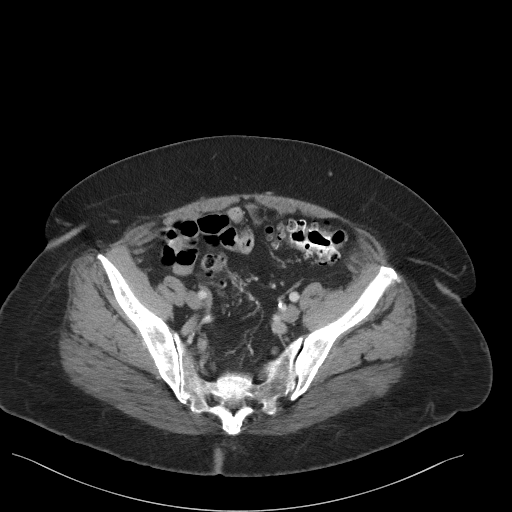
[im 33/87  soft-tissue]
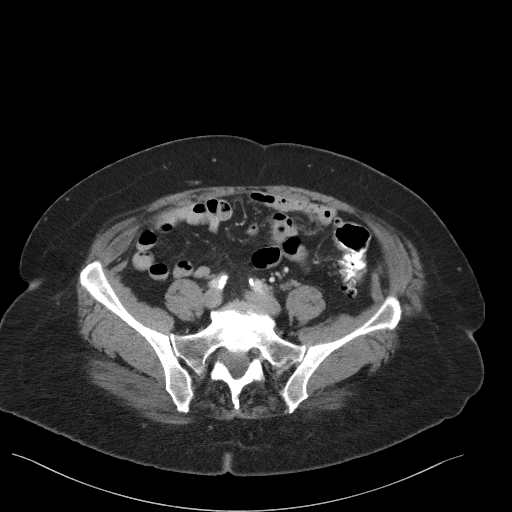
[im 38/87  soft-tissue]
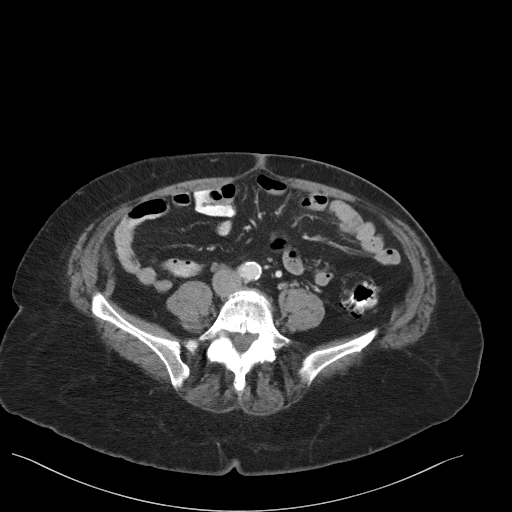
[im 44/87  soft-tissue]
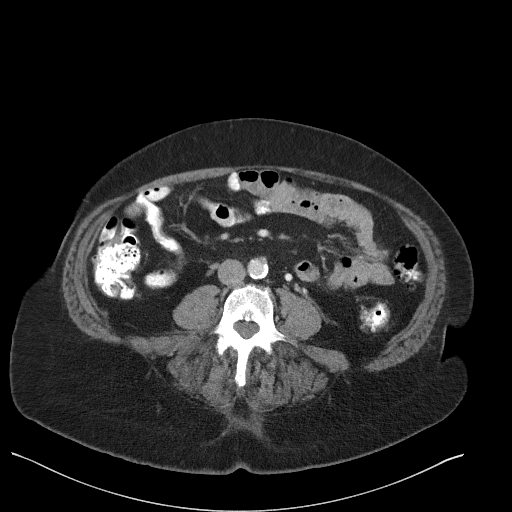
[im 49/87  soft-tissue]
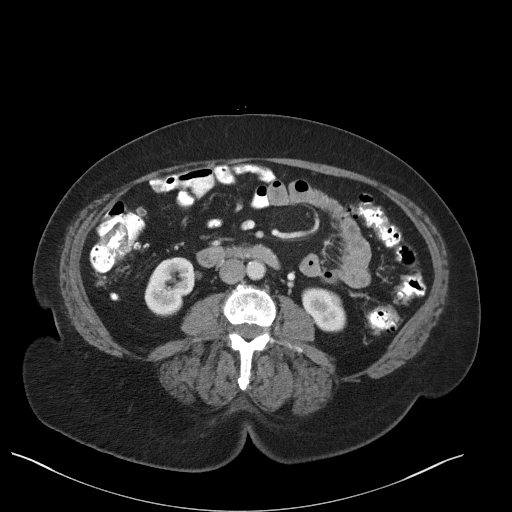
[im 54/87  soft-tissue]
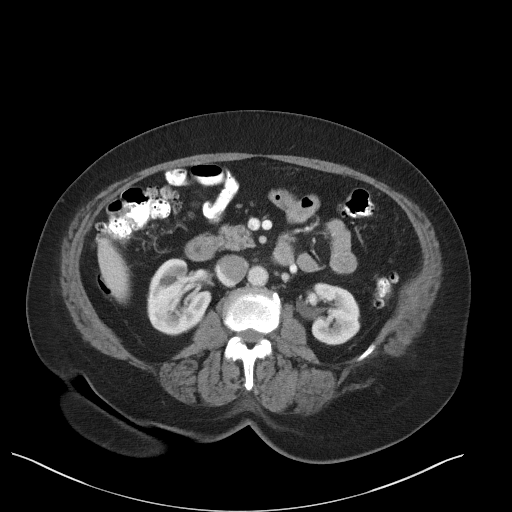
[im 54/87  bone]
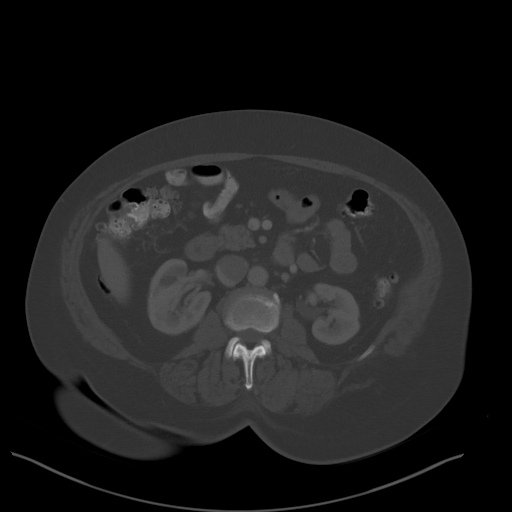
[im 60/87  soft-tissue]
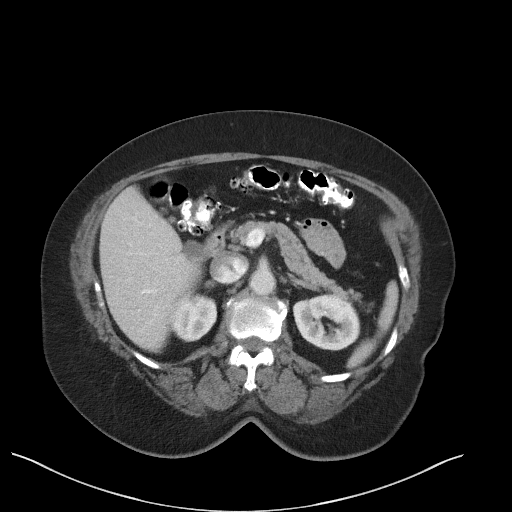
[im 70/87  soft-tissue]
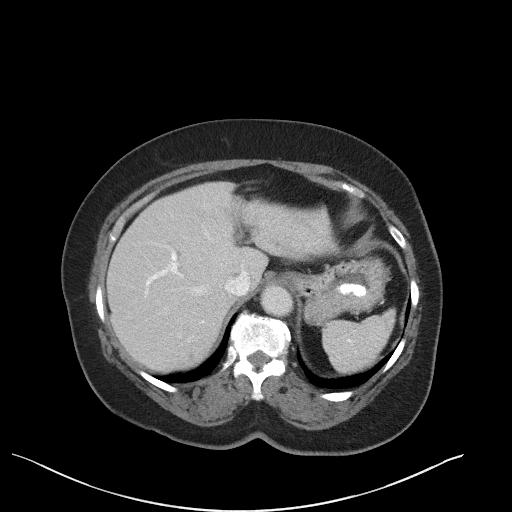
[im 76/87  soft-tissue]
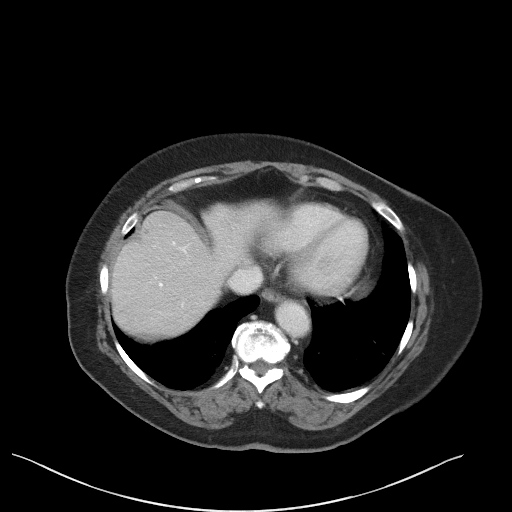
[im 81/87  soft-tissue]
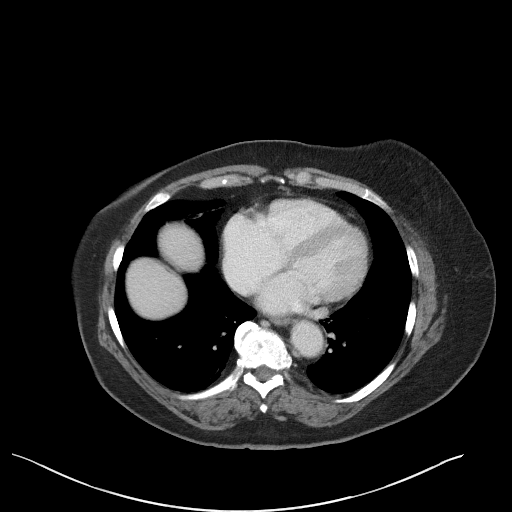

[Series 5: coronal st · coronal · 0.68mm/px · 3 of 98 slices shown]
[im 33/98  soft-tissue]
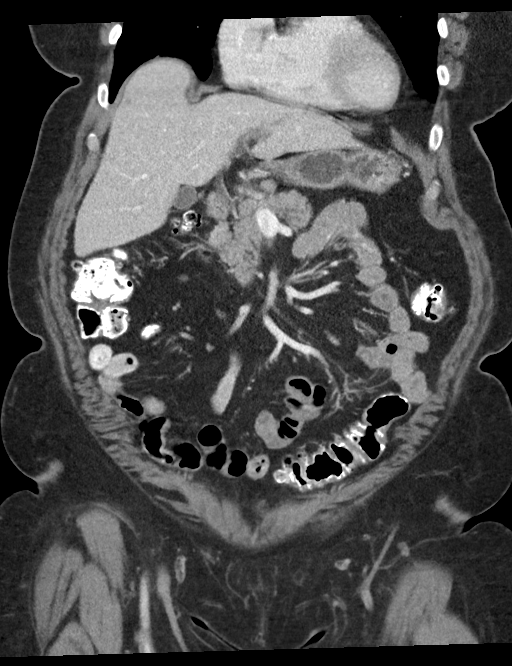
[im 44/98  soft-tissue]
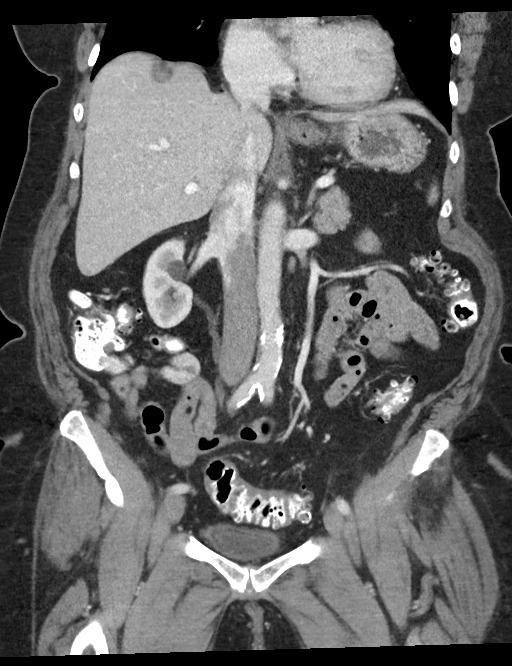
[im 54/98  soft-tissue]
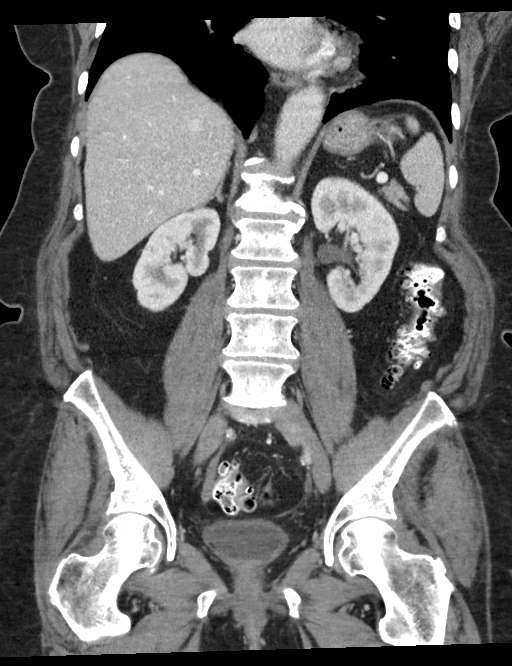

[16 of 46 positions shown; findings below may reference images not displayed]

FINDINGS: Lower chest: No acute abnormality.

Hepatobiliary: No suspicious liver abnormality identified. Again
seen is complete thrombosis of the left portal vein. No underlying
mass identified. Expected volume loss from the left hepatic lobe is
noted when compared with 08/22/2019. The right portal vein, main
portal vein and portal venous confluence remain patent.

Gallbladder unremarkable.  No bile duct dilatation.

Pancreas: Unremarkable. No pancreatic ductal dilatation or
surrounding inflammatory changes.

Spleen: Normal in size without focal abnormality.

Adrenals/Urinary Tract: Normal appearance of the adrenal glands.
Multiple small right kidney cysts are identified. No mass or
hydronephrosis identified bilaterally. The urinary bladder is
unremarkable.

Stomach/Bowel: Stomach appears normal. The small bowel loops have a
normal course and caliber. The appendix is visualized and appears
normal. Scattered colonic diverticula are identified without acute
inflammation.

Vascular/Lymphatic: Aortic atherosclerosis. No aneurysm. No
abdominopelvic adenopathy.

Reproductive: Status post hysterectomy. No adnexal masses.

Other: No free fluid or fluid collections

Musculoskeletal: Spondylosis noted within the thoracolumbar spine.
No suspicious or acute osseous abnormality.
IMPRESSION: 1. Stable appearance of complete thrombosis of the left portal vein.
Expected volume loss from the left hepatic lobe is noted when
compared with the previous exam.
2. Colonic diverticulosis without acute inflammation.
3. Aortic atherosclerosis.

Aortic Atherosclerosis ([id number]).

## 2021-09-13 ENCOUNTER — Other Ambulatory Visit: Payer: Medicare Other

## 2021-09-16 ENCOUNTER — Other Ambulatory Visit: Payer: Self-pay | Admitting: Family Medicine

## 2021-09-16 DIAGNOSIS — I10 Essential (primary) hypertension: Secondary | ICD-10-CM

## 2021-09-17 ENCOUNTER — Other Ambulatory Visit: Payer: Medicare (Managed Care)

## 2021-09-17 DIAGNOSIS — E7800 Pure hypercholesterolemia, unspecified: Secondary | ICD-10-CM | POA: Diagnosis not present

## 2021-09-17 DIAGNOSIS — Z5181 Encounter for therapeutic drug level monitoring: Secondary | ICD-10-CM | POA: Diagnosis not present

## 2021-09-18 LAB — LIPID PANEL
Chol/HDL Ratio: 3.4 ratio (ref 0.0–4.4)
Cholesterol, Total: 191 mg/dL (ref 100–199)
HDL: 56 mg/dL (ref >39)
LDL Chol Calc (NIH): 121 mg/dL — ABNORMAL HIGH (ref 0–99)
Triglycerides: 74 mg/dL (ref 0–149)
VLDL Cholesterol Cal: 14 mg/dL (ref 5–40)

## 2021-09-18 LAB — HEPATIC FUNCTION PANEL
ALT: 15 IU/L (ref 0–32)
AST: 23 IU/L (ref 0–40)
Albumin: 4.3 g/dL (ref 3.7–4.7)
Alkaline Phosphatase: 100 IU/L (ref 44–121)
Bilirubin Total: 0.8 mg/dL (ref 0.0–1.2)
Bilirubin, Direct: 0.17 mg/dL (ref 0.00–0.40)
Total Protein: 6.8 g/dL (ref 6.0–8.5)

## 2021-09-20 ENCOUNTER — Other Ambulatory Visit: Payer: Self-pay | Admitting: *Deleted

## 2021-09-20 MED ORDER — ATORVASTATIN CALCIUM 40 MG PO TABS: 40.0000 mg | ORAL_TABLET | Freq: Every day | ORAL | 0 refills | Status: DC

## 2021-10-30 NOTE — Progress Notes (Unsigned)
  No chief complaint on file.   Patient presents for 6 month follow-up on chronic problems.  Hypertension follow-up:  BP's are not checked elsewhere. She is compliant with atenolol and lisinopril HCT.  Denies dizziness, headaches, chest pain. Denies side effects of medications. Denies any leg/muscle cramps. She tries to follow low sodium diet, doesn't add salt. She buys frozen vegetables instead of cans.  At her physical, she reported eating bologna with breakfast daily. UPDATE   BP Readings from Last 3 Encounters:  04/30/21 132/74  04/26/21 118/68  01/02/21 (!) 160/76     Hyperlipidemia follow-up: Patient is reportedly trying to follow a low-fat, low cholesterol diet. Compliant with medications and denies medication side effects.  She wasn't able to tolerate 12m of pravastatin (reported tingling in feet as SE, which didn't occur at 457mdose).  She was switched to 2074mtorvastatin in 06/2021.  She didn't reach goal, and dose was increased to 55m9mter her labs in May 2023.    She denies changes to diet--eats 1 egg 5x/week. She is due for recheck.  Lab Results  Component Value Date   CHOL 191 09/17/2021   HDL 56 09/17/2021   LDLCALC 121 (H) 09/17/2021   TRIG 74 09/17/2021   CHOLHDL 3.4 09/17/2021    Aortic atherosclerosis--noted on CT in 11/2019.  She is compliant with statin. Due for lipid recheck   Obesity and pre-diabetes: When first diagnosed she was drinking grape juice, +sweets/cookies. She no longer drinks much juice, and cut back on the sweets in her diet. Last A1c was 5.9% at her CPE in 04/2021.   PMH, PSH, SH reviewed    ROS: no fever, chills, URI symptoms, headaches, dizziness, chest pain, shortness of breath, GI or GU complaints. Moods are good.   Occasional leg cramp. No numbness or tingling. No bleeding, bruising, rash.  Joint pains? Knee?    PHYSICAL EXAM:  There were no vitals taken for this visit.  Wt Readings from Last 3 Encounters:  04/30/21 193  lb (87.5 kg)  04/26/21 193 lb 6.4 oz (87.7 kg)  01/02/21 195 lb 12.8 oz (88.8 kg)   Pleasant, well-appearing obese female, in good spirits HEENT: conjunctiva and sclera are clear, EOMI Neck: no lymphadenopathy, thyromegaly or carotid bruit Heart: regular rate and rhythm, no murmur Lungs: clear bilaterally Back: no spinal or CVA tenderness Abdomen: soft, nontender, no mass Extremities: no edema, 2+ pulses Psych: normal mood, affect, hygiene and grooming Neuro: alert and oriented, normal strength, gait   ASSESSMENT/PLAN:  Did she get Tdap from pharmacy?? If not, please remind her to!  A1c  Lipids, c-met

## 2021-10-31 ENCOUNTER — Ambulatory Visit (INDEPENDENT_AMBULATORY_CARE_PROVIDER_SITE_OTHER): Payer: Medicare (Managed Care) | Admitting: Family Medicine

## 2021-10-31 ENCOUNTER — Encounter: Payer: Self-pay | Admitting: Family Medicine

## 2021-10-31 VITALS — BP 140/70 | HR 60 | Ht 61.0 in | Wt 187.6 lb

## 2021-10-31 DIAGNOSIS — I700 Atherosclerosis of aorta: Secondary | ICD-10-CM | POA: Diagnosis not present

## 2021-10-31 DIAGNOSIS — R7301 Impaired fasting glucose: Secondary | ICD-10-CM | POA: Diagnosis not present

## 2021-10-31 DIAGNOSIS — Z5181 Encounter for therapeutic drug level monitoring: Secondary | ICD-10-CM | POA: Diagnosis not present

## 2021-10-31 DIAGNOSIS — I10 Essential (primary) hypertension: Secondary | ICD-10-CM

## 2021-10-31 DIAGNOSIS — E7800 Pure hypercholesterolemia, unspecified: Secondary | ICD-10-CM

## 2021-10-31 DIAGNOSIS — Z6835 Body mass index (BMI) 35.0-35.9, adult: Secondary | ICD-10-CM

## 2021-10-31 DIAGNOSIS — E6609 Other obesity due to excess calories: Secondary | ICD-10-CM | POA: Diagnosis not present

## 2021-10-31 LAB — POCT GLYCOSYLATED HEMOGLOBIN (HGB A1C): Hemoglobin A1C: 6.1 % — AB (ref 4.0–5.6)

## 2021-10-31 NOTE — Patient Instructions (Addendum)
Please get your tetanus booster (TdaP) from the pharmacy.  Try and eat [place] food less often, or avoid soy sauce.  Occasional low sodium soy sauce is okay (still high in sodium).  Please limit your portions of bread, rice and pasta. Use whole grain bread, pasta and brown rice instead of all "white foods". Try and eat more vegetables and lean proteins, and cut back on the carbs. This should help with weight loss and in lowering sugars.  You are due for a follow-up mammogram at [facility] next month.   Call them to schedule if you don't hear from them soon.

## 2021-11-01 LAB — COMPREHENSIVE METABOLIC PANEL
ALT: 15 IU/L (ref 0–32)
AST: 15 IU/L (ref 0–40)
Albumin/Globulin Ratio: 1.7 (ref 1.2–2.2)
Albumin: 4.3 g/dL (ref 3.7–4.7)
Alkaline Phosphatase: 112 IU/L (ref 44–121)
BUN/Creatinine Ratio: 22 (ref 12–28)
BUN: 22 mg/dL (ref 8–27)
Bilirubin Total: 0.6 mg/dL (ref 0.0–1.2)
CO2: 27 mmol/L (ref 20–29)
Calcium: 9.3 mg/dL (ref 8.7–10.3)
Chloride: 101 mmol/L (ref 96–106)
Creatinine, Ser: 1.01 mg/dL — ABNORMAL HIGH (ref 0.57–1.00)
Globulin, Total: 2.5 g/dL (ref 1.5–4.5)
Glucose: 101 mg/dL — ABNORMAL HIGH (ref 70–99)
Potassium: 4 mmol/L (ref 3.5–5.2)
Sodium: 143 mmol/L (ref 134–144)
Total Protein: 6.8 g/dL (ref 6.0–8.5)
eGFR: 57 mL/min/{1.73_m2} — ABNORMAL LOW (ref >59)

## 2021-11-01 LAB — LIPID PANEL
Chol/HDL Ratio: 3.5 ratio (ref 0.0–4.4)
Cholesterol, Total: 175 mg/dL (ref 100–199)
HDL: 50 mg/dL (ref >39)
LDL Chol Calc (NIH): 113 mg/dL — ABNORMAL HIGH (ref 0–99)
Triglycerides: 65 mg/dL (ref 0–149)
VLDL Cholesterol Cal: 12 mg/dL (ref 5–40)

## 2021-12-06 LAB — HM MAMMOGRAPHY

## 2021-12-10 ENCOUNTER — Other Ambulatory Visit: Payer: Self-pay | Admitting: Family Medicine

## 2021-12-12 ENCOUNTER — Encounter: Payer: Self-pay | Admitting: *Deleted

## 2021-12-15 ENCOUNTER — Other Ambulatory Visit: Payer: Self-pay | Admitting: Family Medicine

## 2021-12-15 DIAGNOSIS — I10 Essential (primary) hypertension: Secondary | ICD-10-CM

## 2022-02-18 ENCOUNTER — Encounter: Payer: Self-pay | Admitting: Family Medicine

## 2022-02-18 ENCOUNTER — Ambulatory Visit (INDEPENDENT_AMBULATORY_CARE_PROVIDER_SITE_OTHER): Payer: Medicare Other | Admitting: Family Medicine

## 2022-02-18 VITALS — BP 128/68 | HR 60 | Ht 61.0 in | Wt 188.4 lb

## 2022-02-18 DIAGNOSIS — Z23 Encounter for immunization: Secondary | ICD-10-CM

## 2022-02-18 DIAGNOSIS — L729 Follicular cyst of the skin and subcutaneous tissue, unspecified: Secondary | ICD-10-CM

## 2022-02-18 DIAGNOSIS — L089 Local infection of the skin and subcutaneous tissue, unspecified: Secondary | ICD-10-CM | POA: Diagnosis not present

## 2022-02-18 MED ORDER — DOXYCYCLINE HYCLATE 100 MG PO TABS: 100.0000 mg | ORAL_TABLET | Freq: Two times a day (BID) | ORAL | 0 refills | Status: DC

## 2022-02-18 NOTE — Progress Notes (Signed)
Chief Complaint  Patient presents with   Back Pain    Left sided back pain where she has had a boil in the past. Itching and painful, sore. She states that her left breast had a spot on her mammogram and she wonders if this could be related.    She has a spot on left back that has been itchy for 2 months.  Has been scratching at it a lot.  It is a little tender, sore when she scratches it. Hasn't drained anything. This was an area where she previously had a cyst, that was infected--seen for this in  December 2022.  She was wondering if this could have anything to do with her recall from mammogram, mentioned that something was "very deep". Mammo reviewed--concerning spot was not present at f/u, only stable, unchanged area x years.   PMH, PSH, SH reviewed  Outpatient Encounter Medications as of 02/18/2022  Medication Sig   atenolol (TENORMIN) 25 MG tablet TAKE 1 TABLET(25 MG) BY MOUTH DAILY   atorvastatin (LIPITOR) 40 MG tablet TAKE 1 TABLET(40 MG) BY MOUTH DAILY   cholecalciferol (VITAMIN D) 1000 UNITS tablet Take 1,000 Units by mouth daily.   doxycycline (VIBRA-TABS) 100 MG tablet Take 1 tablet (100 mg total) by mouth 2 (two) times daily.   lisinopril-hydrochlorothiazide (ZESTORETIC) 20-12.5 MG tablet TAKE 1 TABLET BY MOUTH DAILY   No facility-administered encounter medications on file as of 02/18/2022.   No Known Allergies  ROS: no f/c, n/v/d. Denies URI symptoms, back pain/muscle pain, chest pain, shortness of breath.  Moods are good. Denies breast pain or lump.  Itchy spot on her back per HPI   PHYSICAL EXAM:  BP 128/68   Pulse 60   Ht '5\' 1"'$  (1.549 m)   Wt 188 lb 6.4 oz (85.5 kg)   BMI 35.60 kg/m   Well-appearing, pleasant female, in no distress, in good spirits HEENT: conjunctiva and sclera are clear, EOMI Neck: no lymphadenopathy Back: skin-- 2 spots that are next to each other at the left mid back. One is a non-inflamed EIC with dark/blue central pore.  Just  superomedial to this is a <14m papular area that feels somewhat firm (like a scar), not indurated, not tender, no fluctuance.  It is slightly pink. There is some mild erythema/hyperpigmentation surrounding this entire area, but she has been rubbing/scratching it. No axillary lymphadenopathy  ASSESSMENT/PLAN:  Infected cyst of skin - seems mildly inflamed, poss early infection (of the more medial lesion). Advised to stop scratching, HC BID-TID prn - Plan: doxycycline (VIBRA-TABS) 100 MG tablet  Need for influenza vaccination - Plan: Flu Vaccine QUAD High Dose(Fluad)   Please stop scratching--this may increase the risk for infection and bleeding. Use 1% hydrocortisone cream up to 3 times daily, if needed for itching. You may have an early infection of a cyst forming next to the prior cyst.   Take the antibiotics for a week and hopefully this will help.  Counseled re COVID and RSV vaccines and timing.

## 2022-02-18 NOTE — Patient Instructions (Addendum)
Please stop scratching--this may increase the risk for infection and bleeding. Use 1% hydrocortisone cream up to 3 times daily, if needed for itching. You may have an early infection of a cyst forming next to the prior cyst.   Take the antibiotics for a week and hopefully this will help.   I recommend getting COVID booster in 2 weeks (from today's flu shot). 2 weeks after that, I encourage you to get the new RSV vaccine. You need to get that from the pharmacy (covered by medicare part D).

## 2022-05-07 NOTE — Progress Notes (Unsigned)
No chief complaint on file.   Sylvia Gutierrez is a 76 y.o. female who presents for annual physical exam, Medicare wellness visit and follow-up on chronic medical conditions.    Hypertension follow-up:  BP's are not checked elsewhere.  She is compliant with atenolol and lisinopril HCT.  Denies dizziness, headaches, chest pain. Denies side effects of medications. Denies any leg/muscle cramps. She tries to follow low sodium diet, doesn't add salt. She buys frozen vegetables instead of cans.  She no longer eats lunch meats, processed meats. At her last visit she reported having [place] food, soy sauce. UPDATE   BP Readings from Last 3 Encounters:  02/18/22 128/68  10/31/21 140/70  04/30/21 132/74     Hyperlipidemia follow-up: Patient is reportedly trying to follow a low-fat, low cholesterol diet. Compliant with medications and denies medication side effects.  She reports compliance with atorvastatin 79m (increased from 243mafter 09/2021 labs, previously didn't tolerate pravastatin 8013mue to tingling in feet). Didn't see a significant improvement with change of dose, reviewed diet and is due for recheck today. Current diet  (Prev had 1 egg 5x/week)   Lab Results  Component Value Date   CHOL 175 10/31/2021   HDL 50 10/31/2021   LDLCALC 113 (H) 10/31/2021   TRIG 65 10/31/2021   CHOLHDL 3.5 10/31/2021    Aortic atherosclerosis--noted on CT in 11/2019.  She is compliant with statin. Due for lipid recheck   Obesity and pre-diabetes: When first diagnosed she was drinking grape juice, +sweets/cookies. She no longer drinks much juice, and cut back on the sweets in her diet. Last A1c was 6.1% in June 2023. At that time she reported +bread/pasta/rice.  UPDATE ANY CHANGES??  She is exercising 3x/week, walking and bicycle x 1 hour.  H/o adenomatous colon polyps, last colonoscopy was in 02/2018 by Dr. PyrHilarie Fredricksonhe had 2 tubular adenomas, in addition to diverticulosis and internal hemorrhoids.  5 year follow-up was recommended. She denies any GI complaints.   Immunization History  Administered Date(s) Administered   Fluad Quad(high Dose 65+) 01/07/2019, 04/13/2020, 04/30/2021, 02/18/2022   Influenza, High Dose Seasonal PF 03/04/2013, 02/14/2014, 02/27/2015, 04/25/2016, 06/09/2017   PFIZER(Purple Top)SARS-COV-2 Vaccination 02/14/2020, 03/15/2020   PPD Test 09/01/2013   Pfizer Covid-19 Vaccine Bivalent Booster [id number] 04/30/2021   Pneumococcal Conjugate-13 02/14/2014   Pneumococcal Polysaccharide-23 11/28/2010, 10/05/2020   Td 05/13/2005   Tdap 06/20/2011   Zoster Recombinat (Shingrix) 01/07/2019, 03/08/2019   Zoster, Live 01/10/2010   Last Pap smear: 07/04/08, s/p hysterectomy  Last mammogram: 11/2021 Last colonoscopy: 02/2018, +polyps, 5 year f/u recommended Last DEXA: 04/2016 normal (lowest was T- 0.7 at L fem neck) Dentist: no longer goes--had the rest of her teeth removed  Ophtho: yearly Exercise--She goes  [facility] [employer] 3x/week, using elliptical, exercise bike, treadmill, and dancercise once/week.  1 hour total at the gym. Doesn't use any weights. Normal vitamin D level in 2014   Patient Care Team: KnapRita Ohara as PCP - General (Family Medicine) GI: Dr. PyrtHilarie Fredricksontho: Dr. GroaKaty Fitchtist: [facility]--no longer goes Podiatrist: [facility]--no longer sees Ortho: Dr. DeanMarlou Sae-onc: Dr. ShadAlen Blewr portal vein thrombosis)   Depression Screening: Flowsheet Row Clinical Support from [facility] [facility] [place] [facility] Total Score 0        Falls screen:     02/18/2022   10:58 AM 10/31/2021   10:22 AM 04/30/2021   10:32 AM 04/13/2020   10:14 AM 10/13/2019   10:37  AM  Fall Risk   Falls in the past year? 0 0 0 0 0  Number falls in past yr: 0 0 0    Injury with Fall? 0 0 0    Risk for fall due to : No Fall Risks No Fall Risks No Fall Risks    Follow up Falls evaluation completed Falls evaluation completed Falls evaluation  completed       Functional Status Survey:        End of Life Discussion:  Patient has a living will and medical power of attorney, scanned in chart   PMH, [place], [place] reviewed    ROS: The patient denies fever, vision changes, decreased hearing, ear pain, sore throat, breast concerns, chest pain, palpitations, dizziness, syncope, dyspnea on exertion, nausea, vomiting, diarrhea, constipation, abdominal pain, melena, hematochezia, indigestion/heartburn, hematuria, incontinence, dysuria, vaginal bleeding, discharge, odor or itch, genital lesions, weakness, tremor, suspicious skin lesions, depression, anxiety, abnormal bleeding/bruising, or enlarged lymph nodes.  Mild knee pain (left only). Gel injections have helped in the past    PHYSICAL EXAM:  There were no vitals taken for this visit.  Wt Readings from Last 3 Encounters:  02/18/22 188 lb 6.4 oz (85.5 kg)  10/31/21 187 lb 9.6 oz (85.1 kg)  04/30/21 193 lb (87.5 kg)    General Appearance:   Alert, cooperative, no distress, appears stated age.  Head:   Normocephalic, without obvious abnormality, atraumatic.    Eyes:   PERRL, conjunctiva/corneas clear, EOM's intact, fundi benign  Ears:   Normal TM's and external ear canals    Nose:   Not examined (wearing mask due to coronavirus pandemic)   Throat:   Not examined (wearing mask due to coronavirus pandemic)   Neck:   Supple, no lymphadenopathy; thyroid: no enlargement/tenderness/nodules; no carotid bruit or JVD    Back:   Spine nontender, no curvature, ROM normal, no CVA tenderness    Lungs:   Clear to auscultation bilaterally without wheezes, rales or ronchi; respirations unlabored    Chest Wall:   No tenderness or deformity    Heart:   Regular rate and rhythm, S1 and S2 normal, no murmur, rub or gallop    Breast Exam:   No tenderness, masses, or nipple discharge or inversion. No axillary lymphadenopathy    Abdomen:   Soft, non-tender, nondistended, normoactive bowel sounds, no  masses, no hepatosplenomegaly    Genitalia:   Normal external genitalia without lesions. BUS and vagina normal; uterus surgically absent. No adnexal masses or tenderness. Pap not performed    Rectal:   Normal tone, no masses or tenderness; guaiac negative stool    Extremities:   No clubbing, cyanosis or edema.   Pulses:   2+ and symmetric all extremities    Skin:   Skin color, texture, turgor normal, no rashes or lesions.   Lymph nodes:   Cervical, supraclavicular, inguinal and axillary nodes normal    Neurologic:   Normal strength, sensation and gait; reflexes 2+ and symmetric throughout                        Psych:  Normal mood, affect, hygiene and grooming   ASSESSMENT/PLAN:  COVID--offer/enter/decline RSV rec from pharmacy TdaP due, to get from pharmacy  Lipids, cbc, c-met TSH if sx  ??need atenolol RF? Others good for another 1-2 months   Discussed monthly self breast exams and yearly mammograms; at least 30 minutes of aerobic activity at least 5 days/week and weight-bearing  exercise 2x/week; proper sunscreen use reviewed; healthy diet, including goals of calcium and vitamin D intake and alcohol recommendations (less than or equal to 1 drink/day) reviewed; regular seatbelt use; changing batteries in smoke detectors.  Immunization recommendations discussed-- COVID booster RSV vaccine recommended from the pharmacy. Tdap due from pharmacy Colonoscopy is UTD, repeat due 02/2023    MOST form reviewed, updated. Full Code, Full Care  F/u 6 months   Medicare Attestation I have personally reviewed: The patient's medical and social history Their use of alcohol, tobacco or illicit drugs Their current medications and supplements The patient's functional ability including ADLs,fall risks, home safety risks, cognitive, and hearing and visual impairment Diet and physical activities Evidence for depression or mood disorders  The patient's weight, height, BMI have been recorded in the  chart.  I have made referrals, counseling, and provided education to the patient based on review of the above and I have provided the patient with a written personalized care plan for preventive services.

## 2022-05-07 NOTE — Patient Instructions (Incomplete)
  HEALTH MAINTENANCE RECOMMENDATIONS:  It is recommended that you get at least 30 minutes of aerobic exercise at least 5 days/week (for weight loss, you may need as much as 60-90 minutes). This can be any activity that gets your heart rate up. This can be divided in 10-15 minute intervals if needed, but try and build up your endurance at least once a week.  Weight bearing exercise is also recommended twice weekly.  Eat a healthy diet with lots of vegetables, fruits and fiber.  "Colorful" foods have a lot of vitamins (ie green vegetables, tomatoes, red peppers, etc).  Limit sweet tea, regular sodas and alcoholic beverages, all of which has a lot of calories and sugar.  Up to 1 alcoholic drink daily may be beneficial for women (unless trying to lose weight, watch sugars).  Drink a lot of water.  Calcium recommendations are 1200-1500 mg daily (1500 mg for postmenopausal women or women without ovaries), and vitamin D 1000 IU daily.  This should be obtained from diet and/or supplements (vitamins), and calcium should not be taken all at once, but in divided doses.  Monthly self breast exams and yearly mammograms for women over the age of 62 is recommended.  Sunscreen of at least SPF 30 should be used on all sun-exposed parts of the skin when outside between the hours of 10 am and 4 pm (not just when at beach or pool, but even with exercise, golf, tennis, and yard work!)  Use a sunscreen that says "broad spectrum" so it covers both UVA and UVB rays, and make sure to reapply every 1-2 hours.  Remember to change the batteries in your smoke detectors when changing your clock times in the spring and fall. Carbon monoxide detectors are recommended for your home.  Use your seat belt every time you are in a car, and please drive safely and not be distracted with cell phones and texting while driving.   Sylvia Gutierrez , Thank you for taking time to come for your Medicare Wellness Visit. I appreciate your ongoing  commitment to your health goals. Please review the following plan we discussed and let me know if I can assist you in the future.   This is a list of the screening recommended for you and due dates:  Health Maintenance  Topic Date Due   Medicare Annual Wellness Visit  Never done   DTaP/Tdap/Td vaccine (3 - Td or Tdap) 06/19/2021   COVID-19 Vaccine (4 - 2023-24 season) 01/11/2022   Colon Cancer Screening  02/12/2023   Pneumonia Vaccine  Completed   Flu Shot  Completed   DEXA scan (bone density measurement)  Completed   Hepatitis C Screening: USPSTF Recommendation to screen - Ages 85-79 yo.  Completed   Zoster (Shingles) Vaccine  Completed   HPV Vaccine  Aged Out   I recommend getting the RSV vaccine from the pharmacy. You are due to get a tetanus booster (TdaP), and you need to get this from the pharmacy.

## 2022-05-09 ENCOUNTER — Ambulatory Visit (INDEPENDENT_AMBULATORY_CARE_PROVIDER_SITE_OTHER): Payer: Medicare Other | Admitting: Family Medicine

## 2022-05-09 ENCOUNTER — Encounter: Payer: Self-pay | Admitting: Family Medicine

## 2022-05-09 VITALS — BP 122/74 | HR 61 | Temp 98.1°F | Ht 62.0 in | Wt 187.2 lb

## 2022-05-09 DIAGNOSIS — I700 Atherosclerosis of aorta: Secondary | ICD-10-CM

## 2022-05-09 DIAGNOSIS — Z0000 Encounter for general adult medical examination without abnormal findings: Secondary | ICD-10-CM

## 2022-05-09 DIAGNOSIS — E7800 Pure hypercholesterolemia, unspecified: Secondary | ICD-10-CM

## 2022-05-09 DIAGNOSIS — Z6834 Body mass index (BMI) 34.0-34.9, adult: Secondary | ICD-10-CM

## 2022-05-09 DIAGNOSIS — E6609 Other obesity due to excess calories: Secondary | ICD-10-CM | POA: Diagnosis not present

## 2022-05-09 DIAGNOSIS — R7301 Impaired fasting glucose: Secondary | ICD-10-CM

## 2022-05-09 DIAGNOSIS — I10 Essential (primary) hypertension: Secondary | ICD-10-CM

## 2022-05-09 DIAGNOSIS — Z5181 Encounter for therapeutic drug level monitoring: Secondary | ICD-10-CM | POA: Diagnosis not present

## 2022-05-09 LAB — CBC WITH DIFFERENTIAL/PLATELET
Basophils Absolute: 0 10*3/uL (ref 0.0–0.2)
Basos: 0 %
EOS (ABSOLUTE): 0.1 10*3/uL (ref 0.0–0.4)
Eos: 2 %
Hematocrit: 39.6 % (ref 34.0–46.6)
Hemoglobin: 13 g/dL (ref 11.1–15.9)
Immature Grans (Abs): 0 10*3/uL (ref 0.0–0.1)
Immature Granulocytes: 0 %
Lymphocytes Absolute: 1.5 10*3/uL (ref 0.7–3.1)
Lymphs: 22 %
MCH: 28.1 pg (ref 26.6–33.0)
MCHC: 32.8 g/dL (ref 31.5–35.7)
MCV: 86 fL (ref 79–97)
Monocytes Absolute: 0.6 10*3/uL (ref 0.1–0.9)
Monocytes: 8 %
Neutrophils Absolute: 4.5 10*3/uL (ref 1.4–7.0)
Neutrophils: 68 %
Platelets: 281 10*3/uL (ref 150–450)
RBC: 4.63 x10E6/uL (ref 3.77–5.28)
RDW: 13.1 % (ref 11.7–15.4)
WBC: 6.7 10*3/uL (ref 3.4–10.8)

## 2022-05-09 LAB — COMPREHENSIVE METABOLIC PANEL
ALT: 23 IU/L (ref 0–32)
AST: 23 IU/L (ref 0–40)
Albumin/Globulin Ratio: 1.8 (ref 1.2–2.2)
Albumin: 4.4 g/dL (ref 3.8–4.8)
Alkaline Phosphatase: 117 IU/L (ref 44–121)
BUN/Creatinine Ratio: 18 (ref 12–28)
BUN: 18 mg/dL (ref 8–27)
Bilirubin Total: 0.6 mg/dL (ref 0.0–1.2)
CO2: 26 mmol/L (ref 20–29)
Calcium: 9.8 mg/dL (ref 8.7–10.3)
Chloride: 100 mmol/L (ref 96–106)
Creatinine, Ser: 1.01 mg/dL — ABNORMAL HIGH (ref 0.57–1.00)
Globulin, Total: 2.5 g/dL (ref 1.5–4.5)
Glucose: 102 mg/dL — ABNORMAL HIGH (ref 70–99)
Potassium: 3.9 mmol/L (ref 3.5–5.2)
Sodium: 143 mmol/L (ref 134–144)
Total Protein: 6.9 g/dL (ref 6.0–8.5)
eGFR: 57 mL/min/{1.73_m2} — ABNORMAL LOW (ref >59)

## 2022-05-09 LAB — LIPID PANEL
Chol/HDL Ratio: 3.3 ratio (ref 0.0–4.4)
Cholesterol, Total: 178 mg/dL (ref 100–199)
HDL: 54 mg/dL (ref >39)
LDL Chol Calc (NIH): 107 mg/dL — ABNORMAL HIGH (ref 0–99)
Triglycerides: 90 mg/dL (ref 0–149)
VLDL Cholesterol Cal: 17 mg/dL (ref 5–40)

## 2022-05-09 LAB — HEMOGLOBIN A1C
Est. average glucose Bld gHb Est-mCnc: 128 mg/dL
Hgb A1c MFr Bld: 6.1 % — ABNORMAL HIGH (ref 4.8–5.6)

## 2022-05-09 MED ORDER — ATENOLOL 25 MG PO TABS: ORAL_TABLET | ORAL | 3 refills | Status: DC

## 2022-05-10 NOTE — Progress Notes (Signed)
Advise pt--fasting sugar slightly elevated at 102, A1c remained the same as last time, 6.1 (with diabetes being 6.5).  Continue to limit juices, sugary beverages, bread/pasta/rice (choosing whole grain options in small quantities), and work on weight loss, which will also help keep sugar down. Continue regular exercise.  Electrolytes, kidney and liver tests are fine, normal blood counts. Cholesterol is a little better, but the LDL is still over 100.  Confirm that she hasn't missed any doses of her atorvastatin '40mg'$ .  If not, I'd like to increase the dose to '80mg'$ .  She can double up on what she has left (only has about a month left, so will only last 2 weeks), so please send in a new rx for '80mg'$  dose.  Have her contact us if she has any side effects or issues with the higher dose.

## 2022-05-15 ENCOUNTER — Telehealth: Payer: Self-pay | Admitting: Family Medicine

## 2022-05-15 ENCOUNTER — Other Ambulatory Visit: Payer: Self-pay | Admitting: *Deleted

## 2022-05-15 MED ORDER — ATORVASTATIN CALCIUM 80 MG PO TABS: 80.0000 mg | ORAL_TABLET | Freq: Every day | ORAL | 0 refills | Status: DC

## 2022-05-15 NOTE — Telephone Encounter (Signed)
Pt returned call re lab results. Went over Dr. Johnsie Kindred instructions with her, pt expressed understanding. She has not missed any doses of atorvastatin and she will double up on current dose and needs rx For 80 mg atorvastatin sent to [facility]

## 2022-05-15 NOTE — Telephone Encounter (Signed)
Rx sent 

## 2022-05-21 DIAGNOSIS — Z1231 Encounter for screening mammogram for malignant neoplasm of breast: Secondary | ICD-10-CM | POA: Diagnosis not present

## 2022-05-21 LAB — HM MAMMOGRAPHY

## 2022-05-24 ENCOUNTER — Encounter: Payer: Self-pay | Admitting: *Deleted

## 2022-06-09 ENCOUNTER — Other Ambulatory Visit: Payer: Self-pay | Admitting: Family Medicine

## 2022-06-12 DIAGNOSIS — I10 Essential (primary) hypertension: Secondary | ICD-10-CM | POA: Diagnosis not present

## 2022-06-12 DIAGNOSIS — E785 Hyperlipidemia, unspecified: Secondary | ICD-10-CM | POA: Diagnosis not present

## 2022-07-01 ENCOUNTER — Other Ambulatory Visit: Payer: Self-pay | Admitting: Family Medicine

## 2022-07-01 DIAGNOSIS — I10 Essential (primary) hypertension: Secondary | ICD-10-CM

## 2022-08-27 DIAGNOSIS — M170 Bilateral primary osteoarthritis of knee: Secondary | ICD-10-CM | POA: Diagnosis not present

## 2022-08-27 DIAGNOSIS — M1712 Unilateral primary osteoarthritis, left knee: Secondary | ICD-10-CM | POA: Diagnosis not present

## 2022-08-30 ENCOUNTER — Other Ambulatory Visit: Payer: Self-pay | Admitting: Family Medicine

## 2022-10-30 DIAGNOSIS — E785 Hyperlipidemia, unspecified: Secondary | ICD-10-CM | POA: Diagnosis not present

## 2022-10-30 DIAGNOSIS — I10 Essential (primary) hypertension: Secondary | ICD-10-CM | POA: Diagnosis not present

## 2022-11-10 DIAGNOSIS — E785 Hyperlipidemia, unspecified: Secondary | ICD-10-CM | POA: Diagnosis not present

## 2022-11-10 DIAGNOSIS — I10 Essential (primary) hypertension: Secondary | ICD-10-CM | POA: Diagnosis not present

## 2022-11-10 NOTE — Progress Notes (Unsigned)
No chief complaint on file.  Patient presents for 6 month follow-up on chronic problems.  Hypertension follow-up:  BP's are not checked elsewhere.  She is compliant with atenolol and lisinopril HCT.  Denies dizziness, headaches, chest pain. Denies side effects of medications. Denies any leg/muscle cramps. She tries to follow low sodium diet, doesn't add salt. She buys frozen vegetables instead of cans.  She no longer eats lunch meats, processed meats. She no longer eats [place] food/soy sauce.   BP Readings from Last 3 Encounters:  05/09/22 122/74  02/18/22 128/68  10/31/21 140/70      Hyperlipidemia follow-up: Patient is reportedly trying to follow a low-fat, low cholesterol diet. Compliant with medications and denies medication side effects.  Atorvastatin dose was increased from 40 to 80mg  due to LDL remaining >100. She denies side effects. She is due for recheck. Current diet includes 1 egg 5x/week. Red meat 2x/month.   Lab Results  Component Value Date   CHOL 178 05/09/2022   HDL 54 05/09/2022   LDLCALC 107 (H) 05/09/2022   TRIG 90 05/09/2022   CHOLHDL 3.3 05/09/2022    Aortic atherosclerosis--noted on CT in 11/2019.  She is compliant with statin. Due for lipid recheck   Obesity and pre-diabetes: When first diagnosed she was drinking grape juice, +sweets/cookies. She no longer drinks much juice (just occasional orange juice), and cut back on the sweets in her diet. Last A1c was 6.1% in 04/2022; she had cut back to 1 slice of bread instead of 2, using honey wheat instead of white bread.  She uses brown rice. 3-4x/week she goes to the gym, uses the bicycle and elliptical. No weights.  Lab Results  Component Value Date   HGBA1C 6.1 (H) 05/09/2022    H/o adenomatous colon polyps, last colonoscopy was in 02/2018 by Dr. Rhea Belton. She had 2 tubular adenomas, in addition to diverticulosis and internal hemorrhoids. 5 year follow-up was recommended. She denies any GI complaints.     PMH, PSH, SH reviewed   ROS: no fever, chills, URI symptoms, headaches, dizziness, chest pain, shortness of breath, GI or GU complaints. Moods are good.   No numbness or tingling. No bleeding, bruising, rash. Occasional knee pain    PHYSICAL EXAM:  There were no vitals taken for this visit.  Wt Readings from Last 3 Encounters:  05/09/22 187 lb 3.2 oz (84.9 kg)  02/18/22 188 lb 6.4 oz (85.5 kg)  10/31/21 187 lb 9.6 oz (85.1 kg)   Pleasant, well-appearing obese female, in good spirits HEENT: conjunctiva and sclera are clear, EOMI Neck: no lymphadenopathy, thyromegaly or carotid bruit Heart: regular rate and rhythm, no murmur Lungs: clear bilaterally Back: no spinal or CVA tenderness Abdomen: soft, nontender, no mass Extremities: no edema, 2+ pulses Psych: normal mood, affect, hygiene and grooming Neuro: alert and oriented, normal strength, gait    ASSESSMENT/PLAN:  Did she get Tdap from pharmacy?? COVID? RSV?  Colonoscopy due in October  F/u in 6 months for CPE/AWV

## 2022-11-10 NOTE — Patient Instructions (Incomplete)
You will be due for another colonoscopy in October 2024. If you don't hear from Dr. Lauro Franklin office by November, call  [facility] to schedule this.  COVID booster is recommended. At this point, you can wait for the new/updated one to come out this Fall. Continue yearly high dose flu shots in the Fall. Get the RSV vaccine along with the flu shot this Fall--you need to get the RSV vaccine from the pharmacy.  We discussed getting some weight-bearing exercise 2x/week to keep your bones strong.  If you are having more frequent genital issues/concerns, return sooner than your physical for further evaluation.

## 2022-11-11 ENCOUNTER — Ambulatory Visit (INDEPENDENT_AMBULATORY_CARE_PROVIDER_SITE_OTHER): Payer: Medicare Other | Admitting: Family Medicine

## 2022-11-11 ENCOUNTER — Encounter: Payer: Self-pay | Admitting: Family Medicine

## 2022-11-11 VITALS — BP 124/74 | HR 64 | Ht 62.0 in | Wt 185.6 lb

## 2022-11-11 DIAGNOSIS — Z5181 Encounter for therapeutic drug level monitoring: Secondary | ICD-10-CM

## 2022-11-11 DIAGNOSIS — E7800 Pure hypercholesterolemia, unspecified: Secondary | ICD-10-CM | POA: Diagnosis not present

## 2022-11-11 DIAGNOSIS — I10 Essential (primary) hypertension: Secondary | ICD-10-CM

## 2022-11-11 DIAGNOSIS — J309 Allergic rhinitis, unspecified: Secondary | ICD-10-CM

## 2022-11-11 DIAGNOSIS — H6121 Impacted cerumen, right ear: Secondary | ICD-10-CM | POA: Diagnosis not present

## 2022-11-11 DIAGNOSIS — Z6833 Body mass index (BMI) 33.0-33.9, adult: Secondary | ICD-10-CM

## 2022-11-11 DIAGNOSIS — R7301 Impaired fasting glucose: Secondary | ICD-10-CM

## 2022-11-11 DIAGNOSIS — E6609 Other obesity due to excess calories: Secondary | ICD-10-CM

## 2022-11-11 LAB — LIPID PANEL

## 2022-11-11 LAB — POCT GLYCOSYLATED HEMOGLOBIN (HGB A1C): Hemoglobin A1C: 5.8 % — AB (ref 4.0–5.6)

## 2022-11-11 MED ORDER — ATORVASTATIN CALCIUM 80 MG PO TABS: ORAL_TABLET | ORAL | 1 refills | Status: DC

## 2022-11-11 MED ORDER — LISINOPRIL-HYDROCHLOROTHIAZIDE 20-12.5 MG PO TABS: 1.0000 | ORAL_TABLET | Freq: Every day | ORAL | 1 refills | Status: DC

## 2022-11-12 ENCOUNTER — Encounter: Payer: Self-pay | Admitting: Family Medicine

## 2022-11-12 LAB — LIPID PANEL
Cholesterol, Total: 139 mg/dL (ref 100–199)
HDL: 57 mg/dL (ref >39)
LDL Chol Calc (NIH): 69 mg/dL (ref 0–99)
Triglycerides: 66 mg/dL (ref 0–149)
VLDL Cholesterol Cal: 13 mg/dL (ref 5–40)

## 2022-11-12 LAB — CMP14+EGFR
ALT: 25 IU/L (ref 0–32)
AST: 28 IU/L (ref 0–40)
Albumin: 4.1 g/dL (ref 3.8–4.8)
Alkaline Phosphatase: 96 IU/L (ref 44–121)
BUN/Creatinine Ratio: 20 (ref 12–28)
BUN: 20 mg/dL (ref 8–27)
Bilirubin Total: 1 mg/dL (ref 0.0–1.2)
CO2: 28 mmol/L (ref 20–29)
Calcium: 9.6 mg/dL (ref 8.7–10.3)
Chloride: 102 mmol/L (ref 96–106)
Creatinine, Ser: 0.99 mg/dL (ref 0.57–1.00)
Globulin, Total: 2.3 g/dL (ref 1.5–4.5)
Glucose: 94 mg/dL (ref 70–99)
Potassium: 3.8 mmol/L (ref 3.5–5.2)
Sodium: 142 mmol/L (ref 134–144)
Total Protein: 6.4 g/dL (ref 6.0–8.5)
eGFR: 58 mL/min/{1.73_m2} — ABNORMAL LOW (ref >59)

## 2022-11-20 ENCOUNTER — Encounter (HOSPITAL_COMMUNITY): Payer: Self-pay | Admitting: Emergency Medicine

## 2022-11-20 ENCOUNTER — Emergency Department (HOSPITAL_COMMUNITY)
Admission: EM | Admit: 2022-11-20 | Discharge: 2022-11-20 | Disposition: A | Discharge status: Home / Self Care | Payer: Medicare Other | Attending: Emergency Medicine | Admitting: Emergency Medicine

## 2022-11-20 ENCOUNTER — Other Ambulatory Visit: Payer: Self-pay

## 2022-11-20 DIAGNOSIS — Z79899 Other long term (current) drug therapy: Secondary | ICD-10-CM | POA: Diagnosis not present

## 2022-11-20 DIAGNOSIS — R001 Bradycardia, unspecified: Secondary | ICD-10-CM | POA: Insufficient documentation

## 2022-11-20 DIAGNOSIS — I10 Essential (primary) hypertension: Secondary | ICD-10-CM | POA: Insufficient documentation

## 2022-11-20 NOTE — ED Triage Notes (Signed)
Pt endorses HTN since last week. Pt on lisinopril and atenolol. Endorses intermittent mild headaches.

## 2022-11-20 NOTE — Discharge Instructions (Addendum)
Thank you for allowing [place] to be a part of your care today.  You were evaluated in the ED for hypertension.    Please continue taking your blood pressure medications as prescribed.  I have provided you with a form and information on how to take your blood pressure at home.  It is important for you to keep track of these values so your primary care doctor understands what your blood pressure is doing at home.   Call your primary care doctor and schedule a follow up appointment with them regarding your blood pressure.   Return to the ED if you develop chest pain, shortness of breath, stroke symptoms, blurred vision, or if you have any new concerns.

## 2022-11-20 NOTE — ED Provider Notes (Signed)
[facility] AT [facility] Provider Note   CSN: [id number] Arrival date & time: 11/20/22  1127     History  Chief Complaint  Patient presents with   Hypertension    Sylvia Gutierrez is a 77 y.o. female with past medical history significant for hypertension, hyperlipidemia, obesity presents to the ED with concerns of her blood pressure.  Patient states her blood pressure numbers have been high at home since last week.  She has been compliant with her medications.  She states she has been feeling well and is experiencing no symptoms.  Denies chest pain, shortness of breath, visual disturbance, headaches, dizziness, light-headedness, focal neuro deficit, leg swelling.       Home Medications Prior to Admission medications   Medication Sig Start Date End Date Taking? Authorizing Provider  atenolol (TENORMIN) 25 MG tablet TAKE 1 TABLET(25 MG) BY MOUTH DAILY 05/09/22   Joselyn Arrow, MD  atorvastatin (LIPITOR) 80 MG tablet TAKE 1 TABLET(80 MG) BY MOUTH DAILY 11/11/22   Joselyn Arrow, MD  cholecalciferol (VITAMIN D) 1000 UNITS tablet Take 1,000 Units by mouth daily.    [provider]  lisinopril-hydrochlorothiazide (ZESTORETIC) 20-12.5 MG tablet Take 1 tablet by mouth daily. 11/11/22   Joselyn Arrow, MD      Allergies    Patient has no known allergies.    Review of Systems   Review of Systems  Eyes:  Negative for visual disturbance.  Respiratory:  Negative for shortness of breath.   Cardiovascular:  Negative for chest pain and leg swelling.  Neurological:  Negative for dizziness, speech difficulty, weakness, light-headedness and headaches.    Physical Exam Updated Vital Signs BP (!) 163/84 (BP Location: Left Arm)   Pulse (!) 55   Temp 98.2 F (36.8 C) (Oral)   Resp 16   Ht 5\' 2"  (1.575 m)   Wt 84 kg   SpO2 97%   BMI 33.87 kg/m  Physical Exam Vitals and nursing note reviewed.  Constitutional:      General: She is not in acute distress.     Appearance: Normal appearance. She is not ill-appearing or diaphoretic.  HENT:     Mouth/Throat:     Mouth: Mucous membranes are moist.  Eyes:     Conjunctiva/sclera: Conjunctivae normal.     Pupils: Pupils are equal, round, and reactive to light.  Cardiovascular:     Rate and Rhythm: Regular rhythm. Bradycardia present.     Pulses: Normal pulses.     Heart sounds: Normal heart sounds.  Pulmonary:     Effort: Pulmonary effort is normal. No tachypnea or respiratory distress.     Breath sounds: Normal breath sounds and air entry.  Musculoskeletal:     Right lower leg: No edema.     Left lower leg: No edema.  Skin:    General: Skin is warm and dry.     Capillary Refill: Capillary refill takes less than 2 seconds.  Neurological:     Mental Status: She is alert. Mental status is at baseline.  Psychiatric:        Mood and Affect: Mood normal.        Behavior: Behavior normal.     ED Results / Procedures / Treatments   Labs (all labs ordered are listed, but only abnormal results are displayed) Labs Reviewed - No data to display  EKG None  Radiology No results found.  Procedures Procedures    Medications Ordered in ED Medications - No data  to display  ED Course/ Medical Decision Making/ A&P                             Medical Decision Making  This patient presents to the ED with chief complaint(s) of asymptomatic hypertension with pertinent past medical history of hypertension, hyperlipidemia.  The complaint involves an extensive differential diagnosis and also carries with it a high risk of complications and morbidity.    The differential diagnosis includes asymptomatic hypertension, hypertensive urgency, hypertensive emergency   Record Review: I reviewed primary care visit from 11/11/22 where patient was seen for chronic health conditions.  Patient did not mention issues with blood pressure at home, but also was not keeping log of her values.  Patient is on  lisinopril-hydrochlorothiazide and atenolol.  She is compliant with medications per PCP.   Review of lab work showed normal Cr with GFR of 58, otherwise metabolic panel unremarkable.  A1C slightly elevated at 5.8.   Initial Assessment:   Exam significant for overall well appearing patient without subjective complaint.  Heart rate is in the 50s with regular rhythm.  Lungs clear to auscultation bilaterally.  Vision is grossly intact.  No edema appreciated in lower extremities.  Skin is warm and dry.  Vital signs are stable.  BP 165/66 at time of initial assessment.  Disposition:   Patient presented to the ED with concerns of hypertension, but is asymptomatic.  I do not feel that ED workup is required at this time.  Patient had visit with PCP on 11/11/22 and her chronic health conditions were discussed.  Will provide patient with information on how to take her blood pressure at home and a paper log.  Discussed with patient importance of keeping track of her blood pressure at home.  Patient's BP here in ED is elevated, but not at levels of urgency or emergency.  Advised patient to schedule a follow-up appointment with her PCP and present BP values from home.    The patient has been appropriately medically screened and/or stabilized in the ED. I have low suspicion for any other emergent medical condition which would require further screening, evaluation or treatment in the ED or require inpatient management. At time of discharge the patient is hemodynamically stable and in no acute distress. I have discussed work-up results and diagnosis with patient and answered all questions. Patient is agreeable with discharge plan. We discussed strict return precautions for returning to the emergency department and they verbalized understanding.            Final Clinical Impression(s) / ED Diagnoses Final diagnoses:  Asymptomatic hypertension    Rx / DC Orders ED Discharge Orders     None         Lenard Simmer, PA-C 11/20/22 1234    Rondel Baton, MD 11/21/22 [phone]

## 2022-11-25 ENCOUNTER — Telehealth: Payer: Self-pay | Admitting: *Deleted

## 2022-11-25 NOTE — Telephone Encounter (Signed)
Transition Care Management Unsuccessful Follow-up Telephone Call  Date of discharge and from where:  [facility]   11/20/2022  Attempts:  1st Attempt  Reason for unsuccessful TCM follow-up call:  Left voice message

## 2022-11-26 NOTE — Progress Notes (Unsigned)
No chief complaint on file.  Patient presents for ER follow-up. She went to the ER on 7/10 due to BP's running high. They had been running high for a week.  She was compliant with her meds, and didn't have any symptoms related to her elevated BP. No labs, EKG or other studies were done. She was advised to monitor her BP, keep track of them on a log they provided, and to f/u with PCP. Her BP had been normal at her visit earlier this month.  As discussed at her 7/1 visit here-- She had been checking BP's at home, but stopped writing them down (thought they were being sent to [place]), recalled them being good, just once saw it high with systolic of 158. She tries to follow low sodium diet, doesn't add salt. She buys frozen vegetables instead of cans.  She no longer eats lunch meats, processed meats. She no longer eats [place] food/soy sauce.   BP Readings from Last 3 Encounters:  11/20/22 (!) 163/84  11/11/22 124/74  05/09/22 122/74    She is compliant with atenolol and lisinopril HCT.   Denies dizziness, headaches, chest pain. Denies side effects of medications. Denies any leg/muscle cramps.   PMH, PSH, SH reviewed   ROS: No fever, chills, URI symptoms, headache, dizziness, chest pain, palpitations, syncope, edema.  No muscle cramps/spasms. No n/v/d, no urinary complaints. No bleeding, bruising, rash. Moods are good   PHYSICAL EXAM:  There were no vitals taken for this visit.  Wt Readings from Last 3 Encounters:  11/20/22 185 lb 3 oz (84 kg)  11/11/22 185 lb 9.6 oz (84.2 kg)  05/09/22 187 lb 3.2 oz (84.9 kg)      ASSESSMENT/PLAN:

## 2022-11-27 ENCOUNTER — Telehealth: Payer: Self-pay | Admitting: *Deleted

## 2022-11-27 ENCOUNTER — Encounter: Payer: Self-pay | Admitting: Family Medicine

## 2022-11-27 ENCOUNTER — Ambulatory Visit: Payer: Medicare Other | Admitting: Family Medicine

## 2022-11-27 VITALS — BP 138/70 | HR 60 | Ht 62.0 in | Wt 183.6 lb

## 2022-11-27 DIAGNOSIS — I10 Essential (primary) hypertension: Secondary | ICD-10-CM

## 2022-11-27 NOTE — Patient Instructions (Addendum)
Your blood pressure is much better in the office than the readings you report at home. I suspect that your home monitor isn't accurate (whether it is related to the size of the cuff or not, I can't say). It may be related to your technique as well--be sure that your cuff is at heart level (usually resting your arm on a table. Please return for a nurse visit to assess the accuracy of your monitor. If it isn't accurate, then please don't use that--it will just make you more anxious to see the high readings. Your blood pressure is a little above goal. Be sure to look at the sodium on the labels (crackers) and limit hot dogs, and other processed foods, including chips.

## 2022-11-27 NOTE — Telephone Encounter (Signed)
Transition Care Management Unsuccessful Follow-up Telephone Call  Date of discharge and from where:  [facility] 11/20/2022  Attempts:  2nd Attempt  Reason for unsuccessful TCM follow-up call:  No answer/busy

## 2022-11-28 ENCOUNTER — Other Ambulatory Visit: Payer: Medicare Other

## 2022-11-28 ENCOUNTER — Telehealth: Payer: Self-pay | Admitting: *Deleted

## 2022-11-28 DIAGNOSIS — E785 Hyperlipidemia, unspecified: Secondary | ICD-10-CM | POA: Diagnosis not present

## 2022-11-28 DIAGNOSIS — I10 Essential (primary) hypertension: Secondary | ICD-10-CM | POA: Diagnosis not present

## 2022-11-28 NOTE — Telephone Encounter (Signed)
Wow-- I guess that means that maybe her TECHNIQUE was off, rather than the cuff itself (holding her arm down, rather than at heart level). Her machine runs just a little high, but her reading is [facility] lower than what she had been reporting.

## 2022-11-28 NOTE — Telephone Encounter (Signed)
Patient brought her cuff in for comparison Sylvia Gutierrez:128/62 Patient's cuff:137/70

## 2022-12-11 DIAGNOSIS — E785 Hyperlipidemia, unspecified: Secondary | ICD-10-CM | POA: Diagnosis not present

## 2022-12-11 DIAGNOSIS — I10 Essential (primary) hypertension: Secondary | ICD-10-CM | POA: Diagnosis not present

## 2022-12-28 DIAGNOSIS — I10 Essential (primary) hypertension: Secondary | ICD-10-CM | POA: Diagnosis not present

## 2022-12-28 DIAGNOSIS — E785 Hyperlipidemia, unspecified: Secondary | ICD-10-CM | POA: Diagnosis not present

## 2023-01-11 DIAGNOSIS — I10 Essential (primary) hypertension: Secondary | ICD-10-CM | POA: Diagnosis not present

## 2023-01-11 DIAGNOSIS — E785 Hyperlipidemia, unspecified: Secondary | ICD-10-CM | POA: Diagnosis not present

## 2023-01-27 DIAGNOSIS — I10 Essential (primary) hypertension: Secondary | ICD-10-CM | POA: Diagnosis not present

## 2023-01-27 DIAGNOSIS — E785 Hyperlipidemia, unspecified: Secondary | ICD-10-CM | POA: Diagnosis not present

## 2023-02-26 DIAGNOSIS — I10 Essential (primary) hypertension: Secondary | ICD-10-CM | POA: Diagnosis not present

## 2023-02-26 DIAGNOSIS — E785 Hyperlipidemia, unspecified: Secondary | ICD-10-CM | POA: Diagnosis not present

## 2023-03-13 DIAGNOSIS — E785 Hyperlipidemia, unspecified: Secondary | ICD-10-CM | POA: Diagnosis not present

## 2023-03-13 DIAGNOSIS — I10 Essential (primary) hypertension: Secondary | ICD-10-CM | POA: Diagnosis not present

## 2023-03-28 DIAGNOSIS — I10 Essential (primary) hypertension: Secondary | ICD-10-CM | POA: Diagnosis not present

## 2023-03-28 DIAGNOSIS — E785 Hyperlipidemia, unspecified: Secondary | ICD-10-CM | POA: Diagnosis not present

## 2023-03-31 ENCOUNTER — Telehealth: Payer: Self-pay | Admitting: Internal Medicine

## 2023-03-31 NOTE — Telephone Encounter (Signed)
Office visit first to discuss given age

## 2023-03-31 NOTE — Telephone Encounter (Signed)
Good afternoon Dr. Rhea Belton,   We received a call from this patient wishing to schedule a colonoscopy. Patient is over the age of 9. Would you like to see patient in the office prior or could colonoscopy be scheduled directly?  Please advise on scheduling, thank you.

## 2023-04-03 ENCOUNTER — Encounter: Payer: Self-pay | Admitting: Internal Medicine

## 2023-04-03 NOTE — Telephone Encounter (Signed)
Patient states she would like to speak with her primary care first then call back.

## 2023-04-12 DIAGNOSIS — E785 Hyperlipidemia, unspecified: Secondary | ICD-10-CM | POA: Diagnosis not present

## 2023-04-12 DIAGNOSIS — I10 Essential (primary) hypertension: Secondary | ICD-10-CM | POA: Diagnosis not present

## 2023-04-27 DIAGNOSIS — E785 Hyperlipidemia, unspecified: Secondary | ICD-10-CM | POA: Diagnosis not present

## 2023-04-27 DIAGNOSIS — I10 Essential (primary) hypertension: Secondary | ICD-10-CM | POA: Diagnosis not present

## 2023-05-05 ENCOUNTER — Encounter: Payer: Self-pay | Admitting: Gastroenterology

## 2023-05-13 DIAGNOSIS — I10 Essential (primary) hypertension: Secondary | ICD-10-CM | POA: Diagnosis not present

## 2023-05-13 DIAGNOSIS — E785 Hyperlipidemia, unspecified: Secondary | ICD-10-CM | POA: Diagnosis not present

## 2023-05-26 ENCOUNTER — Encounter: Payer: Self-pay | Admitting: *Deleted

## 2023-05-27 DIAGNOSIS — I10 Essential (primary) hypertension: Secondary | ICD-10-CM | POA: Diagnosis not present

## 2023-05-27 DIAGNOSIS — E785 Hyperlipidemia, unspecified: Secondary | ICD-10-CM | POA: Diagnosis not present

## 2023-05-27 DIAGNOSIS — Z1231 Encounter for screening mammogram for malignant neoplasm of breast: Secondary | ICD-10-CM | POA: Diagnosis not present

## 2023-05-27 LAB — HM MAMMOGRAPHY

## 2023-05-27 NOTE — Progress Notes (Deleted)
 No chief complaint on file.  Sylvia Gutierrez is a 78 y.o. female who presents for annual physical exam, Medicare wellness visit and follow-up on chronic medical conditions.  She has no complaints.  Hypertension follow-up:  She is compliant with atenolol  and lisinopril  HCT.   She had ER visit in July for high blood pressures.  She brought her monitor in, was found to be running slightly high, but much lower than home values--suspected that her technique was contributing to high results (not keeping arm at heart level).  At that time, she had been to cookouts, had hot dog, hamburger, chips. Also had been eating crackers--didn't look at the label re: sodium content. In general she tries to follow low sodium diet, doesn't add salt. She buys frozen vegetables instead of cans.  She no longer eats lunch meats, processed meats. She no longer eats [place] food/soy sauce.  BP's have been running   ***  BP Readings from Last 3 Encounters:  11/27/22 138/70  11/20/22 (!) 163/84  11/11/22 124/74   Denies dizziness, headaches, chest pain. Denies side effects of medications. Denies any leg/muscle cramps.   Hyperlipidemia follow-up: Patient is reportedly trying to follow a low-fat, low cholesterol diet. Compliant with medications and denies medication side effects.  Atorvastatin  dose was increased from 40 to 80mg  due to LDL remaining >100. Recheck in July was improved. She denies side effects.  Current diet includes 1 egg 5x/week--now using a liquid egg without cholesterol (in a carton, not egg-whites). Red meat 2x/month.   Lab Results  Component Value Date   CHOL 139 11/11/2022   HDL 57 11/11/2022   LDLCALC 69 11/11/2022   TRIG 66 11/11/2022   CHOLHDL 2.4 11/11/2022    Aortic atherosclerosis--noted on CT in 11/2019.  She is compliant with statin, with LDL <100.  Obesity and pre-diabetes: When first diagnosed she was drinking grape juice, +sweets/cookies. She no longer drinks much juice (just  occasional orange juice), and cut back on the sweets in her diet. Last A1c was 5.8% in 11/2022, down from 6.1% in 04/2022. She continues to have 1 slice of bread instead of 2, using honey wheat instead of white bread.  She uses brown rice.  3-4x/week she goes to the gym, uses the bicycle and elliptical (total of an hour of cardio). No weights.    H/o adenomatous colon polyps, last colonoscopy was in 02/2018 by Dr. Bridgett Camps. She had 2 tubular adenomas, in addition to diverticulosis and internal hemorrhoids. 5 year follow-up was recommended; they recommended scheduling an appointment to discuss risks/need, before scheduling appointment. She didn't schedule, preferred to discuss here first. She denies any GI complaints.    Immunization History  Administered Date(s) Administered   Fluad Quad(high Dose 65+) 01/07/2019, 04/13/2020, 04/30/2021, 02/18/2022   Influenza, High Dose Seasonal PF 03/04/2013, 02/14/2014, 02/27/2015, 04/25/2016, 06/09/2017, 05/22/2023   PFIZER(Purple Top)SARS-COV-2 Vaccination 02/14/2020, 03/15/2020   PPD Test 09/01/2013   Pfizer Covid-19 Vaccine Bivalent Booster 69yrs & up 04/30/2021   Pneumococcal Conjugate-13 02/14/2014   Pneumococcal Polysaccharide-23 11/28/2010, 10/05/2020   Td 05/13/2005   Tdap 06/20/2011, 11/05/2021   Zoster Recombinant(Shingrix) 01/07/2019, 03/08/2019   Zoster, Live 01/10/2010   Last Pap smear: 07/04/08, s/p hysterectomy  Last mammogram: 05/2022 Last colonoscopy: 02/2018, +polyps, 5 year f/u recommended Last DEXA: 04/2016 normal (lowest was T- 0.7 at L fem neck) Dentist: no longer goes--had the rest of her teeth removed  Ophtho: yearly Exercise--  She goes  to the [facility] 3-4x/week, using elliptical, exercise bike for an  hour, and dancercise once/week.  1 hour total at the gym. Doesn't use any weights.  Normal vitamin D  level in 2014   Patient Care Team: Roosvelt Colla, MD as PCP - General (Family Medicine) GI: Dr. Bridgett Camps Ophtho: Dr. Candi Chafe Dentist:  [facility]--no longer goes Podiatrist: [facility]--no longer sees Ortho: Dr. Rozelle Corning Heme-onc: Dr. Dirk Fredericks (for portal vein thrombosis)   Depression Screening: Flowsheet Row Office Visit from 05/09/2022 in [facility]  PHQ-2 Total Score 0        Falls screen:     11/11/2022   10:10 AM 05/09/2022    9:39 AM 02/18/2022   10:58 AM 10/31/2021   10:22 AM 04/30/2021   10:32 AM  Fall Risk   Falls in the past year? 0 0 0 0 0  Number falls in past yr: 0 0 0 0 0  Injury with Fall? 0 0 0 0 0  Risk for fall due to : No Fall Risks No Fall Risks No Fall Risks No Fall Risks No Fall Risks  Follow up Falls evaluation completed Falls evaluation completed Falls evaluation completed Falls evaluation completed Falls evaluation completed     Functional Status Survey:    Minor forgetfulness. Slightly worse, but writing things down helps.      End of Life Discussion:  Patient has a living will and medical power of attorney, scanned in chart   PMH, PSH, SH reviewed   ROS: The patient denies fever, vision changes, decreased hearing, ear pain, sore throat, breast concerns, chest pain, palpitations, dizziness, syncope, dyspnea on exertion, nausea, vomiting, diarrhea, constipation, abdominal pain, melena, hematochezia, indigestion/heartburn, hematuria, incontinence, dysuria, vaginal bleeding, discharge, odor or itch, genital lesions, weakness, tremor, suspicious skin lesions, depression, anxiety, abnormal bleeding/bruising, or enlarged lymph nodes.   Mild knee pain (left only). Gel injections have helped in the past. Pain currently is intermittent.   PHYSICAL EXAM:  There were no vitals taken for this visit.  Wt Readings from Last 3 Encounters:  11/27/22 183 lb 9.6 oz (83.3 kg)  11/20/22 185 lb 3 oz (84 kg)  11/11/22 185 lb 9.6 oz (84.2 kg)    General Appearance:   Alert, cooperative, no distress, appears stated age.  Head:   Normocephalic, without  obvious abnormality, atraumatic.    Eyes:   PERRL, conjunctiva/corneas clear, EOM's intact, fundi benign  Ears:   Normal TM's and external ear canals bilaterally  Nose:   No drainage or sinus tenderness  Throat:   Wearing dentures.  Mucosa normal.  Neck:   Supple, no lymphadenopathy; thyroid : no enlargement/tenderness/nodules; no carotid bruit or JVD    Back:   Spine nontender, no curvature, ROM normal, no CVA tenderness    Lungs:   Clear to auscultation bilaterally without wheezes, rales or ronchi; respirations unlabored    Chest Wall:   No tenderness or deformity    Heart:   Regular rate and rhythm, S1 and S2 normal, no murmur, rub or gallop    Breast Exam:   No tenderness, masses, or nipple discharge or inversion. No axillary lymphadenopathy    Abdomen:   Soft, non-tender, nondistended, normoactive bowel sounds, no masses, no hepatosplenomegaly    Genitalia:   Normal external genitalia without lesions. BUS and vagina normal; uterus surgically absent. No adnexal masses or tenderness. Pap not performed ***  Rectal:   Normal tone, no masses or tenderness; guaiac negative stool ***  Extremities:   No clubbing, cyanosis or edema.   Pulses:  2+ and symmetric all extremities    Skin:   Skin color, texture, turgor normal, no rashes or lesions.  Non-inflamed sebaceous cyst at right mid-back, just lateral to a prior cyst which is scarred. No erythema or tenderness  Lymph nodes:   Cervical, supraclavicular, inguinal and axillary nodes normal    Neurologic:   Normal strength, sensation and gait; reflexes 2+ and symmetric throughout                        Psych:  Normal mood, affect, hygiene and grooming  ***UPDATE SKIN--CYST ON BACK UPDATE if pelvic/rectal not done  ASSESSMENT/PLAN:  Did she get flu, covid or RSV vaccines? Also can get Prevnar-20 (if not needing 2 other vaccines)  Mammo due--?scheduled? Can skip pelvic/rectal this year if not having any concerns  Discussed monthly self breast  exams and yearly mammograms; at least 30 minutes of aerobic activity at least 5 days/week and weight-bearing exercise 2x/week; proper sunscreen use reviewed; healthy diet, including goals of calcium  and vitamin D  intake and alcohol recommendations (less than or equal to 1 drink/day) reviewed; regular seatbelt use; changing batteries in smoke detectors.  Immunization recommendations discussed-- Flu Prevnar-20 COVID booster recommended, declined.  RSV vaccine recommended from the pharmacy.  Colonoscopy is due now.    MOST form reviewed, updated. Full Code, Full Care  F/u 6 months for med check  Medicare Attestation I have personally reviewed: The patient's medical and social history Their use of alcohol, tobacco or illicit drugs Their current medications and supplements The patient's functional ability including ADLs,fall risks, home safety risks, cognitive, and hearing and visual impairment Diet and physical activities Evidence for depression or mood disorders  The patient's weight, height, BMI have been recorded in the chart.  I have made referrals, counseling, and provided education to the patient based on review of the above and I have provided the patient with a written personalized care plan for preventive services.

## 2023-05-28 ENCOUNTER — Ambulatory Visit: Payer: Medicare Other | Admitting: Family Medicine

## 2023-05-28 DIAGNOSIS — R7301 Impaired fasting glucose: Secondary | ICD-10-CM

## 2023-05-28 DIAGNOSIS — Z23 Encounter for immunization: Secondary | ICD-10-CM

## 2023-05-28 DIAGNOSIS — Z5181 Encounter for therapeutic drug level monitoring: Secondary | ICD-10-CM

## 2023-05-28 DIAGNOSIS — E7800 Pure hypercholesterolemia, unspecified: Secondary | ICD-10-CM

## 2023-05-28 DIAGNOSIS — Z0000 Encounter for general adult medical examination without abnormal findings: Secondary | ICD-10-CM

## 2023-05-28 DIAGNOSIS — I700 Atherosclerosis of aorta: Secondary | ICD-10-CM

## 2023-05-28 DIAGNOSIS — I10 Essential (primary) hypertension: Secondary | ICD-10-CM

## 2023-05-29 ENCOUNTER — Encounter: Payer: Self-pay | Admitting: Gastroenterology

## 2023-05-29 ENCOUNTER — Ambulatory Visit: Payer: Medicare Other | Admitting: Gastroenterology

## 2023-05-29 VITALS — BP 120/64 | HR 60 | Wt 183.0 lb

## 2023-05-29 DIAGNOSIS — Z860101 Personal history of adenomatous and serrated colon polyps: Secondary | ICD-10-CM

## 2023-05-29 DIAGNOSIS — Z860100 Personal history of colon polyps, unspecified: Secondary | ICD-10-CM

## 2023-05-29 MED ORDER — SUFLAVE 178.7 G PO SOLR: 1.0000 | Freq: Once | ORAL | 0 refills | Status: AC

## 2023-05-29 NOTE — Patient Instructions (Addendum)
You have been scheduled for a colonoscopy. Please follow written instructions given to you at your visit today.   Please pick up your prep supplies at the pharmacy within the next 1-3 days.  If you use inhalers (even only as needed), please bring them with you on the day of your procedure.  DO NOT TAKE 7 DAYS PRIOR TO TEST- Trulicity (dulaglutide) Ozempic, Wegovy (semaglutide) Mounjaro (tirzepatide) Bydureon Bcise (exanatide extended release)  DO NOT TAKE 1 DAY PRIOR TO YOUR TEST Rybelsus (semaglutide) Adlyxin (lixisenatide) Victoza (liraglutide) Byetta (exanatide) ___________________________________________________________________________  Bonita Quin will receive your bowel preparation through [facility], which ensures the lowest copay and home delivery, with outreach via text or call from an [phone] number. Please respond promptly to avoid rescheduling. If you are interested in alternative options or have any questions please contact them at [phone]  Your Provider Has Sent Your Bowel Prep Regimen To [facility] What to expect. [facility] will contact you to verify your information and collect your copay, if applicable. Enjoy the comfort of your home while we deliver your prescription to you, free of any shipping charges. Fast, FREE delivery or shipping. [facility] accepts all major insurance benefits and applies discounts & coupons  Have additional questions? [facility]'s patient care team is always here to help.  Chat: [email or web address] Call: [phone] Email: [email or web address] NCPDP: [id number] How will we contact you? Welcome Phone call  a Welcome text and a Checkout link in a text Texts you receive from [phone] Are Not Spam.   *To set up delivery, you must complete the checkout process via link or speak to one of our patient care representatives. If we are unable to reach you, your prescription may be  delayed.  _______________________________________________________  If your blood pressure at your visit was 140/90 or greater, please contact your primary care physician to follow up on this.  _______________________________________________________  If you are age 72 or older, your body mass index should be between 23-30. Your Body mass index is 33.47 kg/m. If this is out of the aforementioned range listed, please consider follow up with your Primary Care Provider.  If you are age 29 or younger, your body mass index should be between 19-25. Your Body mass index is 33.47 kg/m. If this is out of the aformentioned range listed, please consider follow up with your Primary Care Provider.   ________________________________________________________  The [facility] providers would like to encourage you to use [facility] to communicate with providers for non-urgent requests or questions.  Due to long hold times on the telephone, sending your provider a message by [facility] may be a faster and more efficient way to get a response.  Please allow 48 business hours for a response.  Please remember that this is for non-urgent requests.  _______________________________________________________  Due to recent changes in healthcare laws, you may see the results of your imaging and laboratory studies on MyChart before your provider has had a chance to review them.  We understand that in some cases there may be results that are confusing or concerning to you. Not all laboratory results come back in the same time frame and the provider may be waiting for multiple results in order to interpret others.  Please give [place] 48 hours in order for your provider to thoroughly review all the results before contacting the office for clarification of your results.    Thank you for entrusting me with your care and choosing [facility].  Bayley Leanna Sato, PA-C

## 2023-05-29 NOTE — Progress Notes (Signed)
Chief Complaint: Recall colonoscopy Primary GI MD: Dr. Rhea Belton  HPI: 78 year old female history of hypertension, hyperlipidemia, presents for evaluation of recall colonoscopy.  Last colonoscopy 02/2018 showed two 4 to 5 mm polyps in descending and transverse colon.  Resected and retrieved.  Severe diverticulosis from sigmoid to ascending colon.  Small internal hemorrhoids  Recent CMP July 2024 normal.  Pending CBC and CMP with PCP.  Patient denies any GI issues today.  Denies weight loss, rectal bleeding, change in bowel habits, abdominal pain.  Denies family history of colon cancer.  States she is doing well  PREVIOUS GI WORKUP   Colonoscopy 02/2018 - two 4 to 5 mm polyps (tubular adenomas) in descending and transverse colon.  Resected and retrieved.   - Severe diverticulosis from sigmoid to ascending colon.   - Small internal hemorrhoids. Repeat 5 years  Colonoscopy February 2011 -- 4mm tubular adenoma in sigmoid colon. -- Medium hemorrhoids - Diverticulosis - Repeat 5 years  Past Medical History:  Diagnosis Date   Anemia    Colon polyp    colonoscopy 06/2009, due again 2016   Diverticulosis    Hemorrhoids    Hyperlipidemia    Hypertension    Onychomycosis     Past Surgical History:  Procedure Laterality Date   ABDOMINAL HYSTERECTOMY     for bleeding/benign   CARPAL TUNNEL RELEASE Right    COLONOSCOPY     POLYPECTOMY      Current Outpatient Medications  Medication Sig Dispense Refill   atenolol (TENORMIN) 25 MG tablet TAKE 1 TABLET(25 MG) BY MOUTH DAILY 90 tablet 3   atorvastatin (LIPITOR) 80 MG tablet TAKE 1 TABLET(80 MG) BY MOUTH DAILY 90 tablet 1   cholecalciferol (VITAMIN D) 1000 UNITS tablet Take 1,000 Units by mouth daily.     lisinopril-hydrochlorothiazide (ZESTORETIC) 20-12.5 MG tablet Take 1 tablet by mouth daily. 90 tablet 1   No current facility-administered medications for this visit.    Allergies as of 05/29/2023   (No Known Allergies)     Family History  Problem Relation Age of Onset   Hypertension Mother    Cancer Father        colon (69's)   Colon cancer Father    Cancer Sister        throat (smoker)   Esophageal cancer Sister    Hypertension Brother    Diabetes Brother    Hypertension Brother    Hypertension Brother    Cancer Brother        prostate   Hypertension Daughter    Hypertension Daughter    Breast cancer Neg Hx    Heart disease Neg Hx    Colon polyps Neg Hx    Rectal cancer Neg Hx    Stomach cancer Neg Hx     Social History   Socioeconomic History   Marital status: Married    Spouse name: Not on file   Number of children: 4   Years of education: Not on file   Highest education level: Not on file  Occupational History   Occupation: MACHINE OPERATOR--Retired     Employer: [employer]  Tobacco Use   Smoking status: Former    Current packs/day: 0.00    Types: Cigarettes    Quit date: 05/13/2005    Years since quitting: 18.0   Smokeless tobacco: Never  Vaping Use   Vaping status: Never Used  Substance and Sexual Activity   Alcohol use: No   Drug use: No   Sexual activity:  Not Currently  Other Topics Concern   Not on file  Social History Narrative   Retired 2011, 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  Divorced. Went back to her maiden name Rashae Ferderer, previously [place] Roever) in 2019. Lives alone.  Has 4 children, all in [place].  7 grandchildren, 4 great grandchildren      Reviewed 04/2022   Social Drivers of Health   Financial Resource Strain: Low Risk  (11/11/2022)   Overall Financial Resource Strain (CARDIA)    Difficulty of Paying Living Expenses: Not hard at all  Food Insecurity: No Food Insecurity (11/11/2022)   Hunger Vital Sign    Worried About Running Out of Food in the Last Year: Never true    Ran Out of Food in the Last Year: Never true  Transportation Needs: No Transportation Needs (11/11/2022)   PRAPARE - Administrator, Civil Service (Medical):  No    Lack of Transportation (Non-Medical): No  Physical Activity: Sufficiently Active (11/11/2022)   Exercise Vital Sign    Days of Exercise per Week: 3 days    Minutes of Exercise per Session: 60 min  Stress: No Stress Concern Present (11/11/2022)   [employer] of Occupational Health - Occupational Stress Questionnaire    Feeling of Stress : Not at all  Social Connections: Moderately Integrated (11/11/2022)   Social Connection and Isolation Panel [NHANES]    Frequency of Communication with Friends and Family: Twice a week    Frequency of Social Gatherings with Friends and Family: Twice a week    Attends Religious Services: More than 4 times per year    Active Member of [employer] or Organizations: Yes    Attends Engineer, structural: More than 4 times per year    Marital Status: Divorced  Intimate Partner Violence: Not At Risk (11/11/2022)   Humiliation, Afraid, Rape, and Kick questionnaire    Fear of Current or Ex-Partner: No    Emotionally Abused: No    Physically Abused: No    Sexually Abused: No    Review of Systems:    Constitutional: No weight loss, fever, chills, weakness or fatigue HEENT: Eyes: No change in vision               Ears, Nose, Throat:  No change in hearing or congestion Skin: No rash or itching Cardiovascular: No chest pain, chest pressure or palpitations   Respiratory: No SOB or cough Gastrointestinal: See HPI and otherwise negative Genitourinary: No dysuria or change in urinary frequency Neurological: No headache, dizziness or syncope Musculoskeletal: No new muscle or joint pain Hematologic: No bleeding or bruising Psychiatric: No history of depression or anxiety    Physical Exam:  Vital signs: BP 120/64   Pulse 60   Wt 183 lb (83 kg)   BMI 33.47 kg/m   Constitutional: NAD, Well developed, Well nourished, alert and cooperative.  Appears significantly younger than stated age Head:  Normocephalic and atraumatic. Eyes:   PEERL, EOMI. No  icterus. Conjunctiva pink. Respiratory: Respirations even and unlabored. Lungs clear to auscultation bilaterally.   No wheezes, crackles, or rhonchi.  Cardiovascular:  Regular rate and rhythm. No peripheral edema, cyanosis or pallor.  Gastrointestinal:  Soft, nondistended, nontender. No rebound or guarding. Normal bowel sounds. No appreciable masses or hepatomegaly. Rectal:  Not performed.  Msk:  Symmetrical without gross deformities. Without edema, no deformity or joint abnormality.  Neurologic:  Alert and  oriented x4;  grossly normal neurologically.  Skin:   Dry  and intact without significant lesions or rashes. Psychiatric: Oriented to person, place and time. Demonstrates good judgement and reason without abnormal affect or behaviors.  Physical Exam           RELEVANT LABS AND IMAGING: CBC    Component Value Date/Time   WBC 6.7 05/09/2022 1050   WBC 5.6 01/02/2020 1113   RBC 4.63 05/09/2022 1050   RBC 4.72 01/02/2020 1113   HGB 13.0 05/09/2022 1050   HCT 39.6 05/09/2022 1050   PLT 281 05/09/2022 1050   MCV 86 05/09/2022 1050   MCH 28.1 05/09/2022 1050   MCH 28.2 01/02/2020 1113   MCHC 32.8 05/09/2022 1050   MCHC 32.0 01/02/2020 1113   RDW 13.1 05/09/2022 1050   LYMPHSABS 1.5 05/09/2022 1050   MONOABS 0.4 04/25/2019 1615   EOSABS 0.1 05/09/2022 1050   BASOSABS 0.0 05/09/2022 1050    CMP     Component Value Date/Time   NA 142 11/11/2022 1110   K 3.8 11/11/2022 1110   CL 102 11/11/2022 1110   CO2 28 11/11/2022 1110   GLUCOSE 94 11/11/2022 1110   GLUCOSE 104 (H) 01/02/2020 1113   BUN 20 11/11/2022 1110   CREATININE 0.99 11/11/2022 1110   CREATININE 1.01 (H) 11/23/2019 0818   CREATININE 0.99 (H) 10/28/2016 1027   CALCIUM 9.6 11/11/2022 1110   PROT 6.4 11/11/2022 1110   ALBUMIN 4.1 11/11/2022 1110   AST 28 11/11/2022 1110   AST 22 11/23/2019 0818   ALT 25 11/11/2022 1110   ALT 19 11/23/2019 0818   ALKPHOS 96 11/11/2022 1110   BILITOT 1.0 11/11/2022 1110    BILITOT 0.6 11/23/2019 0818   GFRNONAA 50 (L) 01/02/2020 1113   GFRNONAA 54 (L) 11/23/2019 0818   GFRAA 58 (L) 01/02/2020 1113   GFRAA >60 11/23/2019 0818     Assessment/Plan:   History of colon polyps History of 2 small tubular adenomas October 2019 with a recall of 5 years.  No issues today.  Overall healthy 78 year old that appears much younger than stated age and is active for her age.  Minimal comorbidities.  She is an adequate candidate for repeat colonoscopy in the [facility]. - Schedule colonoscopy at [facility] - I thoroughly discussed the procedure with the patient (at bedside) to include nature of the procedure, alternatives, benefits, and risks (including but not limited to bleeding, infection, perforation, anesthesia/cardiac pulmonary complications).  Patient verbalized understanding and gave verbal consent to proceed with procedure.    Lara Mulch [facility] 05/29/2023, 9:56 AM  Cc: Joselyn Arrow, MD

## 2023-05-31 ENCOUNTER — Other Ambulatory Visit: Payer: Self-pay | Admitting: Family Medicine

## 2023-05-31 DIAGNOSIS — E7800 Pure hypercholesterolemia, unspecified: Secondary | ICD-10-CM

## 2023-05-31 NOTE — Progress Notes (Signed)
Addendum: Reviewed and agree with assessment and management plan. Kadijah Shamoon M, MD  

## 2023-06-08 NOTE — Patient Instructions (Incomplete)
HEALTH MAINTENANCE RECOMMENDATIONS:  It is recommended that you get at least 30 minutes of aerobic exercise at least 5 days/week (for weight loss, you may need as much as 60-90 minutes). This can be any activity that gets your heart rate up. This can be divided in 10-15 minute intervals if needed, but try and build up your endurance at least once a week.  Weight bearing exercise is also recommended twice weekly.  Eat a healthy diet with lots of vegetables, fruits and fiber.  "Colorful" foods have a lot of vitamins (ie green vegetables, tomatoes, red peppers, etc).  Limit sweet tea, regular sodas and alcoholic beverages, all of which has a lot of calories and sugar.  Up to 1 alcoholic drink daily may be beneficial for women (unless trying to lose weight, watch sugars).  Drink a lot of water.  Calcium recommendations are 1200-1500 mg daily (1500 mg for postmenopausal women or women without ovaries), and vitamin D 1000 IU daily.  This should be obtained from diet and/or supplements (vitamins), and calcium should not be taken all at once, but in divided doses.  Monthly self breast exams and yearly mammograms for women over the age of 90 is recommended.  Sunscreen of at least SPF 30 should be used on all sun-exposed parts of the skin when outside between the hours of 10 am and 4 pm (not just when at beach or pool, but even with exercise, golf, tennis, and yard work!)  Use a sunscreen that says "broad spectrum" so it covers both UVA and UVB rays, and make sure to reapply every 1-2 hours.  Remember to change the batteries in your smoke detectors when changing your clock times in the spring and fall. Carbon monoxide detectors are recommended for your home.  Use your seat belt every time you are in a car, and please drive safely and not be distracted with cell phones and texting while driving.    Ms. Grout , Thank you for taking time to come for your Medicare Wellness Visit. I appreciate your ongoing  commitment to your health goals. Please review the following plan we discussed and let me know if I can assist you in the future.   This is a list of the screening recommended for you and due dates:  Health Maintenance  Topic Date Due   Colon Cancer Screening  02/12/2023   Medicare Annual Wellness Visit  05/10/2023   COVID-19 Vaccine (4 - 2024-25 season) 06/11/2023*   DTaP/Tdap/Td vaccine (4 - Td or Tdap) 11/06/2031   Pneumonia Vaccine  Completed   Flu Shot  Completed   DEXA scan (bone density measurement)  Completed   Hepatitis C Screening  Completed   Zoster (Shingles) Vaccine  Completed   HPV Vaccine  Aged Out  *Topic was postponed. The date shown is not the original due date.   Get the RSV vaccine from the pharmacy as soon as you can (we are in the middle of RSV season now!) I also recommend that you get the newest pneumonia shot, Prevnar-20.  You can either get this from the pharmacy (?if they will give both on the same day, vs if you need to separate them). If not getting at the pharmacy the same day, you can get the Prevnar at our office, OR from the pharmacy.  Your blood pressure may be higher now due to higher intake of sodium. See provided information on low sodium diet.  Remember to avoid canned foods (frozen vegetables are better).  Avoid anything that is salted--such as chips.  Rosita Fire are high in sodium.  See handout.  It is better to eat the fruit, than drink the juice (oranges, grapes are fine, and preferred over having juice).  Please add in weight-bearing exercise at least 2x/week. Consider having one of the trainers at the [facility] show you the equipment, and/or take a Silver Sneakers class.  You had some wax in your right ear.  Return for cleaning if you notice a harder time with your hearing on that side.

## 2023-06-08 NOTE — Progress Notes (Unsigned)
No chief complaint on file.  Sylvia Gutierrez is a 78 y.o. female who presents for annual physical exam, Medicare wellness visit and follow-up on chronic medical conditions.    Hypertension follow-up:  She had ER visit in 11/2022 related to high blood pressure. No med changes were made.  Her BP monitor was found to read a little high, but had been much higher at home than in office, likely related to arm position (had by her side, not heart level). At that time, she had been to cookouts, had hot dog, hamburger, chips. Also had been eating crackers--didn't look at the label re: sodium content. In general she tries to follow low sodium diet, doesn't add salt. She buys frozen vegetables instead of cans.  She no longer eats lunch meats, processed meats. She no longer eats [place] food/soy sauce.  She is compliant with atenolol 25mg  and lisinopril HCT 20-12.5.  Denies dizziness, headaches, chest pain. Denies side effects of medications. Denies any leg/muscle cramps.   BP Readings from Last 3 Encounters:  05/29/23 120/64  11/27/22 138/70  11/20/22 (!) 163/84     Hyperlipidemia follow-up: Patient is reportedly trying to follow a low-fat, low cholesterol diet. Compliant with medications (atorvastatin 80mg ) and denies medication side effects.  Current diet includes using a liquid egg without cholesterol (in a carton, not egg-whites). Red meat 2x/month.   Lab Results  Component Value Date   CHOL 139 11/11/2022   HDL 57 11/11/2022   LDLCALC 69 11/11/2022   TRIG 66 11/11/2022   CHOLHDL 2.4 11/11/2022    Aortic atherosclerosis--noted on CT in 11/2019.  She is compliant with statin, and LDL was at goal on last check.   Obesity and pre-diabetes: When first diagnosed she was drinking grape juice, +sweets/cookies. She no longer drinks much juice (just occasional orange juice), and cut back on the sweets in her diet. Last A1c was 5.8% in 11/2022 (down from 6.1% in 04/2022). She limits bread (honey wheat  instead of white bread) to 1 slice.  She uses brown rice.   3-4x/week she goes to the gym, uses the bicycle and elliptical (total of an hour of cardio). No weights.   H/o adenomatous colon polyps, last colonoscopy was in 02/2018 by Dr. Rhea Belton. She had 2 tubular adenomas, in addition to diverticulosis and internal hemorrhoids.  She recently saw GI, and is scheduled to have colonoscopy.  She denies any GI complaints.    Immunization History  Administered Date(s) Administered   Fluad Quad(high Dose 65+) 01/07/2019, 04/13/2020, 04/30/2021, 02/18/2022   Influenza, High Dose Seasonal PF 03/04/2013, 02/14/2014, 02/27/2015, 04/25/2016, 06/09/2017, 05/22/2023   PFIZER(Purple Top)SARS-COV-2 Vaccination 02/14/2020, 03/15/2020   PPD Test 09/01/2013   Pfizer Covid-19 Vaccine Bivalent Booster 87yrs & up 04/30/2021   Pneumococcal Conjugate-13 02/14/2014   Pneumococcal Polysaccharide-23 11/28/2010, 10/05/2020   Td 05/13/2005   Tdap 06/20/2011, 11/05/2021   Zoster Recombinant(Shingrix) 01/07/2019, 03/08/2019   Zoster, Live 01/10/2010   Last Pap smear: 07/04/08, s/p hysterectomy  Last mammogram: 05/2023 Last colonoscopy: 02/2018, +polyps, scheduled Last DEXA: 04/2016 normal (lowest was T- 0.7 at L fem neck) Dentist: no longer goes--had the rest of her teeth removed  Ophtho: yearly Exercise--  She goes  to the [facility] 3-4x/week, using elliptical, exercise bike for an hour, and dancercise once/week.  1 hour total at the gym. Doesn't use any weights.  Normal vitamin D level in 2014   Patient Care Team: Joselyn Arrow, MD as PCP - General (Family Medicine) GI: Dr. Rhea Belton Ophtho: Dr. Dione Booze  Dentist: [facility] at [facility]--no longer goes Podiatrist: [facility]--no longer sees Ortho: Dr. August Saucer Heme-onc: Dr. Clelia Croft (for portal vein thrombosis)   Depression Screening: Flowsheet Row Office Visit from 05/09/2022 in [facility]  PHQ-2 Total Score 0        Falls screen:      11/11/2022   10:10 AM 05/09/2022    9:39 AM 02/18/2022   10:58 AM 10/31/2021   10:22 AM 04/30/2021   10:32 AM  Fall Risk   Falls in the past year? 0 0 0 0 0  Number falls in past yr: 0 0 0 0 0  Injury with Fall? 0 0 0 0 0  Risk for fall due to : No Fall Risks No Fall Risks No Fall Risks No Fall Risks No Fall Risks  Follow up Falls evaluation completed Falls evaluation completed Falls evaluation completed Falls evaluation completed Falls evaluation completed     Functional Status Survey:    Minor forgetfulness. Slightly worse, but writing things down helps.      End of Life Discussion:  Patient has a living will and medical power of attorney, scanned in chart   PMH, PSH, SH reviewed    ROS: The patient denies fever, vision changes, decreased hearing, ear pain, sore throat, breast concerns, chest pain, palpitations, dizziness, syncope, dyspnea on exertion, nausea, vomiting, diarrhea, constipation, abdominal pain, melena, hematochezia, indigestion/heartburn, hematuria, incontinence, dysuria, vaginal bleeding, discharge, odor or itch, genital lesions, weakness, tremor, suspicious skin lesions, depression, anxiety, abnormal bleeding/bruising, or enlarged lymph nodes.  Mild knee pain (left only). Gel injections have helped in the past. Pain currently is intermittent.   PHYSICAL EXAM:  There were no vitals taken for this visit.  Wt Readings from Last 3 Encounters:  05/29/23 183 lb (83 kg)  11/27/22 183 lb 9.6 oz (83.3 kg)  11/20/22 185 lb 3 oz (84 kg)    General Appearance:   Alert, cooperative, no distress, appears stated age.  Head:   Normocephalic, without obvious abnormality, atraumatic.    Eyes:   PERRL, conjunctiva/corneas clear, EOM's intact, fundi benign  Ears:   Normal TM's and external ear canals  Nose:   No drainage or sinus tenderness  Throat:   Wearing dentures.  Mucosa normal.  Neck:   Supple, no lymphadenopathy; thyroid: no enlargement/tenderness/nodules; no  carotid bruit or JVD    Back:   Spine nontender, no curvature, ROM normal, no CVA tenderness    Lungs:   Clear to auscultation bilaterally without wheezes, rales or ronchi; respirations unlabored    Chest Wall:   No tenderness or deformity    Heart:   Regular rate and rhythm, S1 and S2 normal, no murmur, rub or gallop    Breast Exam:   No tenderness, masses, or nipple discharge or inversion. No axillary lymphadenopathy    Abdomen:   Soft, non-tender, nondistended, normoactive bowel sounds, no masses, no hepatosplenomegaly    Genitalia:   Normal external genitalia without lesions. BUS and vagina normal; uterus surgically absent. No adnexal masses or tenderness. Pap not performed    Rectal:   Not performed   Extremities:   No clubbing, cyanosis or edema.   Pulses:   2+ and symmetric all extremities    Skin:   Skin color, texture, turgor normal, no rashes or lesions.  Non-inflamed sebaceous cyst at right mid-back, just lateral to a prior cyst which is scarred. No erythema or tenderness  Lymph nodes:   Cervical, supraclavicular, inguinal and axillary nodes  normal    Neurologic:   Normal strength, sensation and gait; reflexes 2+ and symmetric throughout                        Psych:  Normal mood, affect, hygiene and grooming  Update skin Can decline rectal--colonoscopy is scheduled ***also skip pelvic if no sx  ASSESSMENT/PLAN:  Did she get flu, covid or RSV vaccines? Also can get Prevnar-20 (if not needing 2 other vaccines)   Can skip pelvic/rectal this year if not having any concerns  You'll need to reorder the pended labs please  Discussed monthly self breast exams and yearly mammograms; at least 30 minutes of aerobic activity at least 5 days/week and weight-bearing exercise 2x/week; proper sunscreen use reviewed; healthy diet, including goals of calcium and vitamin D intake and alcohol recommendations (less than or equal to 1 drink/day) reviewed; regular seatbelt use; changing batteries  in smoke detectors.   Immunization recommendations discussed--COVID booster recommended, declined.  RSV vaccine recommended from the pharmacy.  Colonoscopy is due, and is scheduled.    MOST form reviewed, updated. Full Code, Full Care  F/u 6 months   Medicare Attestation I have personally reviewed: The patient's medical and social history Their use of alcohol, tobacco or illicit drugs Their current medications and supplements The patient's functional ability including ADLs,fall risks, home safety risks, cognitive, and hearing and visual impairment Diet and physical activities Evidence for depression or mood disorders  The patient's weight, height, BMI have been recorded in the chart.  I have made referrals, counseling, and provided education to the patient based on review of the above and I have provided the patient with a written personalized care plan for preventive services.

## 2023-06-09 ENCOUNTER — Ambulatory Visit: Payer: Medicare Other | Admitting: Family Medicine

## 2023-06-09 ENCOUNTER — Encounter: Payer: Self-pay | Admitting: Family Medicine

## 2023-06-09 VITALS — BP 134/80 | HR 76 | Ht 62.0 in | Wt 181.8 lb

## 2023-06-09 DIAGNOSIS — I700 Atherosclerosis of aorta: Secondary | ICD-10-CM

## 2023-06-09 DIAGNOSIS — I10 Essential (primary) hypertension: Secondary | ICD-10-CM

## 2023-06-09 DIAGNOSIS — E7800 Pure hypercholesterolemia, unspecified: Secondary | ICD-10-CM

## 2023-06-09 DIAGNOSIS — E66811 Obesity, class 1: Secondary | ICD-10-CM

## 2023-06-09 DIAGNOSIS — Z0000 Encounter for general adult medical examination without abnormal findings: Secondary | ICD-10-CM | POA: Diagnosis not present

## 2023-06-09 DIAGNOSIS — Z5181 Encounter for therapeutic drug level monitoring: Secondary | ICD-10-CM

## 2023-06-09 DIAGNOSIS — Z6833 Body mass index (BMI) 33.0-33.9, adult: Secondary | ICD-10-CM

## 2023-06-09 DIAGNOSIS — R7301 Impaired fasting glucose: Secondary | ICD-10-CM | POA: Diagnosis not present

## 2023-06-09 DIAGNOSIS — E6609 Other obesity due to excess calories: Secondary | ICD-10-CM

## 2023-06-09 DIAGNOSIS — J309 Allergic rhinitis, unspecified: Secondary | ICD-10-CM | POA: Diagnosis not present

## 2023-06-09 LAB — POCT GLYCOSYLATED HEMOGLOBIN (HGB A1C): Hemoglobin A1C: 5.5 % (ref 4.0–5.6)

## 2023-06-10 LAB — CMP14+EGFR
ALT: 24 IU/L (ref 0–32)
AST: 25 IU/L (ref 0–40)
Albumin: 4.1 g/dL (ref 3.8–4.8)
Alkaline Phosphatase: 83 IU/L (ref 44–121)
BUN/Creatinine Ratio: 25 (ref 12–28)
BUN: 24 mg/dL (ref 8–27)
Bilirubin Total: 0.8 mg/dL (ref 0.0–1.2)
CO2: 25 mmol/L (ref 20–29)
Calcium: 9.2 mg/dL (ref 8.7–10.3)
Chloride: 101 mmol/L (ref 96–106)
Creatinine, Ser: 0.95 mg/dL (ref 0.57–1.00)
Globulin, Total: 2.4 g/dL (ref 1.5–4.5)
Glucose: 88 mg/dL (ref 70–99)
Potassium: 3.7 mmol/L (ref 3.5–5.2)
Sodium: 141 mmol/L (ref 134–144)
Total Protein: 6.5 g/dL (ref 6.0–8.5)
eGFR: 61 mL/min/{1.73_m2} (ref >59)

## 2023-06-10 LAB — CBC WITH DIFFERENTIAL/PLATELET
Basophils Absolute: 0 10*3/uL (ref 0.0–0.2)
Basos: 0 %
EOS (ABSOLUTE): 0 10*3/uL (ref 0.0–0.4)
Eos: 1 %
Hematocrit: 43.9 % (ref 34.0–46.6)
Hemoglobin: 14.3 g/dL (ref 11.1–15.9)
Immature Grans (Abs): 0 10*3/uL (ref 0.0–0.1)
Immature Granulocytes: 0 %
Lymphocytes Absolute: 1.4 10*3/uL (ref 0.7–3.1)
Lymphs: 26 %
MCH: 29.1 pg (ref 26.6–33.0)
MCHC: 32.6 g/dL (ref 31.5–35.7)
MCV: 89 fL (ref 79–97)
Monocytes Absolute: 0.4 10*3/uL (ref 0.1–0.9)
Monocytes: 7 %
Neutrophils Absolute: 3.4 10*3/uL (ref 1.4–7.0)
Neutrophils: 66 %
Platelets: 205 10*3/uL (ref 150–450)
RBC: 4.92 x10E6/uL (ref 3.77–5.28)
RDW: 14.2 % (ref 11.7–15.4)
WBC: 5.3 10*3/uL (ref 3.4–10.8)

## 2023-06-11 ENCOUNTER — Other Ambulatory Visit: Payer: Self-pay | Admitting: Family Medicine

## 2023-06-11 DIAGNOSIS — E7800 Pure hypercholesterolemia, unspecified: Secondary | ICD-10-CM

## 2023-06-13 DIAGNOSIS — E785 Hyperlipidemia, unspecified: Secondary | ICD-10-CM | POA: Diagnosis not present

## 2023-06-13 DIAGNOSIS — I10 Essential (primary) hypertension: Secondary | ICD-10-CM | POA: Diagnosis not present

## 2023-06-16 ENCOUNTER — Other Ambulatory Visit: Payer: Self-pay | Admitting: *Deleted

## 2023-06-16 DIAGNOSIS — I10 Essential (primary) hypertension: Secondary | ICD-10-CM

## 2023-06-16 MED ORDER — ATENOLOL 25 MG PO TABS: ORAL_TABLET | ORAL | 1 refills | Status: DC

## 2023-06-16 MED ORDER — LISINOPRIL-HYDROCHLOROTHIAZIDE 20-12.5 MG PO TABS: 1.0000 | ORAL_TABLET | Freq: Every day | ORAL | 1 refills | Status: DC

## 2023-06-22 ENCOUNTER — Other Ambulatory Visit: Payer: Self-pay | Admitting: Family Medicine

## 2023-06-22 DIAGNOSIS — I10 Essential (primary) hypertension: Secondary | ICD-10-CM

## 2023-06-26 DIAGNOSIS — E785 Hyperlipidemia, unspecified: Secondary | ICD-10-CM | POA: Diagnosis not present

## 2023-06-26 DIAGNOSIS — I10 Essential (primary) hypertension: Secondary | ICD-10-CM | POA: Diagnosis not present

## 2023-07-14 ENCOUNTER — Ambulatory Visit: Payer: Self-pay | Admitting: Family Medicine

## 2023-07-14 NOTE — Telephone Encounter (Addendum)
 This RN made 3rd attempt tor each pt, "call cannot be completed as dialed" message. Routing to clinic for follow up.  This RN made 2nd attempt to reach pt, "call cannot be completed as dialed' message.

## 2023-07-14 NOTE — Telephone Encounter (Signed)
 1st attempt to call-"Call cannot be completed as dialed." Patient having trouble getting medication for upcoming colonoscopy. Per [facility] note, should contact, [phone] for alternative options or questions. Will place in call back folder   Copied from CRM [id number]. Topic: Clinical - Prescription Issue >> Jul 14, 2023 10:27 AM Sylvia Gutierrez wrote: Reason for CRM: pt is having  trouble getting her medication for her upcoming colonoscopy.  Pt states it is $100.  I asked her if she has called her GI dr about this. She said she thought she would ask her Dr Lynelle Doctor if she could help her get this straitened out.  Pt did not hear anything I was saying

## 2023-07-15 ENCOUNTER — Encounter: Payer: Self-pay | Admitting: Internal Medicine

## 2023-07-22 ENCOUNTER — Encounter: Payer: Medicare Other | Admitting: Internal Medicine

## 2023-08-11 DIAGNOSIS — I10 Essential (primary) hypertension: Secondary | ICD-10-CM | POA: Diagnosis not present

## 2023-08-11 DIAGNOSIS — E785 Hyperlipidemia, unspecified: Secondary | ICD-10-CM | POA: Diagnosis not present

## 2023-08-18 DIAGNOSIS — I10 Essential (primary) hypertension: Secondary | ICD-10-CM | POA: Diagnosis not present

## 2023-08-18 DIAGNOSIS — E785 Hyperlipidemia, unspecified: Secondary | ICD-10-CM | POA: Diagnosis not present

## 2023-08-25 DIAGNOSIS — E785 Hyperlipidemia, unspecified: Secondary | ICD-10-CM | POA: Diagnosis not present

## 2023-08-25 DIAGNOSIS — I10 Essential (primary) hypertension: Secondary | ICD-10-CM | POA: Diagnosis not present

## 2023-09-10 ENCOUNTER — Telehealth: Payer: Self-pay | Admitting: Gastroenterology

## 2023-09-10 ENCOUNTER — Other Ambulatory Visit: Payer: Self-pay

## 2023-09-10 ENCOUNTER — Telehealth: Payer: Self-pay

## 2023-09-10 MED ORDER — SUFLAVE 178.7 G PO SOLR: 1.0000 | Freq: Once | ORAL | 0 refills | Status: AC

## 2023-09-10 NOTE — Telephone Encounter (Signed)
 Called patient to advised colonoscopy prep was sent to [facility] pharmacy no answer left message on voice mail.

## 2023-09-10 NOTE — Telephone Encounter (Signed)
 Inbound call from patient, would like prep medication called into [facility] on [address], procedure is on 5/6.

## 2023-09-16 ENCOUNTER — Ambulatory Visit (AMBULATORY_SURGERY_CENTER): Admitting: Internal Medicine

## 2023-09-16 ENCOUNTER — Encounter: Payer: Self-pay | Admitting: Internal Medicine

## 2023-09-16 VITALS — BP 169/77 | HR 54 | Temp 97.3°F | Resp 12 | Ht 63.0 in | Wt 183.0 lb

## 2023-09-16 DIAGNOSIS — K5730 Diverticulosis of large intestine without perforation or abscess without bleeding: Secondary | ICD-10-CM

## 2023-09-16 DIAGNOSIS — Z1211 Encounter for screening for malignant neoplasm of colon: Secondary | ICD-10-CM | POA: Diagnosis not present

## 2023-09-16 DIAGNOSIS — Z860100 Personal history of colon polyps, unspecified: Secondary | ICD-10-CM

## 2023-09-16 DIAGNOSIS — K529 Noninfective gastroenteritis and colitis, unspecified: Secondary | ICD-10-CM

## 2023-09-16 DIAGNOSIS — I10 Essential (primary) hypertension: Secondary | ICD-10-CM | POA: Diagnosis not present

## 2023-09-16 DIAGNOSIS — D123 Benign neoplasm of transverse colon: Secondary | ICD-10-CM | POA: Diagnosis not present

## 2023-09-16 DIAGNOSIS — K5732 Diverticulitis of large intestine without perforation or abscess without bleeding: Secondary | ICD-10-CM | POA: Diagnosis not present

## 2023-09-16 DIAGNOSIS — K635 Polyp of colon: Secondary | ICD-10-CM

## 2023-09-16 DIAGNOSIS — E785 Hyperlipidemia, unspecified: Secondary | ICD-10-CM | POA: Diagnosis not present

## 2023-09-16 MED ORDER — SODIUM CHLORIDE 0.9 % IV SOLN: 500.0000 mL | Freq: Once | INTRAVENOUS | Status: DC

## 2023-09-16 NOTE — Patient Instructions (Addendum)
 Resume previous diet Continue present medications Await pathology results See handouts for polyps and diverticulosis YOU HAD AN ENDOSCOPIC PROCEDURE TODAY AT THE [facility]:   Refer to the procedure report that was given to you for any specific questions about what was found during the examination.  If the procedure report does not answer your questions, please call your gastroenterologist to clarify.  If you requested that your care partner not be given the details of your procedure findings, then the procedure report has been included in a sealed envelope for you to review at your convenience later.  YOU SHOULD EXPECT: Some feelings of bloating in the abdomen. Passage of more gas than usual.  Walking can help get rid of the air that was put into your GI tract during the procedure and reduce the bloating. If you had a lower endoscopy (such as a colonoscopy or flexible sigmoidoscopy) you may notice spotting of blood in your stool or on the toilet paper. If you underwent a bowel prep for your procedure, you may not have a normal bowel movement for a few days.  Please Note:  You might notice some irritation and congestion in your nose or some drainage.  This is from the oxygen used during your procedure.  There is no need for concern and it should clear up in a day or so.  SYMPTOMS TO REPORT IMMEDIATELY:  Following lower endoscopy (colonoscopy or flexible sigmoidoscopy):  Excessive amounts of blood in the stool  Significant tenderness or worsening of abdominal pains  Swelling of the abdomen that is new, acute  Fever of 100F or higher  For urgent or emergent issues, a gastroenterologist can be reached at any hour by calling [phone]. Do not use MyChart messaging for urgent concerns.   DIET:  We do recommend a small meal at first, but then you may proceed to your regular diet.  Drink plenty of fluids but you should avoid alcoholic beverages for 24 hours.  ACTIVITY:  You should  plan to take it easy for the rest of today and you should NOT DRIVE or use heavy machinery until tomorrow (because of the sedation medicines used during the test).    FOLLOW UP: Our staff will call the number listed on your records the next business day following your procedure.  We will call around 7:15- 8:00 am to check on you and address any questions or concerns that you may have regarding the information given to you following your procedure. If we do not reach you, we will leave a message.     If any biopsies were taken you will be contacted by phone or by letter within the next 1-3 weeks.  Please call us  at [phone] if you have not heard about the biopsies in 3 weeks.   SIGNATURES/CONFIDENTIALITY: You and/or your care partner have signed paperwork which will be entered into your electronic medical record.  These signatures attest to the fact that that the information above on your After Visit Summary has been reviewed and is understood.  Full responsibility of the confidentiality of this discharge information lies with you and/or your care-partner.

## 2023-09-16 NOTE — Progress Notes (Signed)
 Called to room to assist during endoscopic procedure.  Patient ID and intended procedure confirmed with present staff. Received instructions for my participation in the procedure from the performing physician.

## 2023-09-16 NOTE — Progress Notes (Signed)
 GASTROENTEROLOGY PROCEDURE H&P NOTE   Primary Care Physician: Roosvelt Colla, MD    Reason for Procedure:  History of adenomatous colon polyps  Plan:    Colonoscopy  Patient is appropriate for endoscopic procedure(s) in the ambulatory ([facility]) setting.  The nature of the procedure, as well as the risks, benefits, and alternatives were carefully and thoroughly reviewed with the patient. Ample time for discussion and questions allowed. The patient understood, was satisfied, and agreed to proceed.     HPI: Sylvia Gutierrez is a 78 y.o. female who presents for surveillance colonoscopy.  Medical history as below.  Tolerated the prep.  No recent chest pain or shortness of breath.  No abdominal pain today.  Past Medical History:  Diagnosis Date   Anemia    Colon polyp    colonoscopy 06/2009, due again 2016   Diverticulosis    Hemorrhoids    Hyperlipidemia    Hypertension    Onychomycosis     Past Surgical History:  Procedure Laterality Date   ABDOMINAL HYSTERECTOMY     for bleeding/benign   CARPAL TUNNEL RELEASE Right    COLONOSCOPY Bilateral 02/11/2018   POLYPECTOMY      Prior to Admission medications   Medication Sig Start Date End Date Taking? Authorizing Provider  atenolol  (TENORMIN ) 25 MG tablet TAKE 1 TABLET(25 MG) BY MOUTH DAILY 06/16/23  Yes Roosvelt Colla, MD  atorvastatin  (LIPITOR) 80 MG tablet TAKE 1 TABLET(80 MG) BY MOUTH DAILY 06/11/23  Yes Roosvelt Colla, MD  cholecalciferol (VITAMIN D ) 1000 UNITS tablet Take 1,000 Units by mouth daily.   Yes [provider]  lisinopril -hydrochlorothiazide  (ZESTORETIC ) 20-12.5 MG tablet Take 1 tablet by mouth daily. 06/16/23  Yes Roosvelt Colla, MD    Current Outpatient Medications  Medication Sig Dispense Refill   atenolol  (TENORMIN ) 25 MG tablet TAKE 1 TABLET(25 MG) BY MOUTH DAILY 90 tablet 1   atorvastatin  (LIPITOR) 80 MG tablet TAKE 1 TABLET(80 MG) BY MOUTH DAILY 90 tablet 1   cholecalciferol (VITAMIN D ) 1000 UNITS tablet Take  1,000 Units by mouth daily.     lisinopril -hydrochlorothiazide  (ZESTORETIC ) 20-12.5 MG tablet Take 1 tablet by mouth daily. 90 tablet 1   Current Facility-Administered Medications  Medication Dose Route Frequency Provider Last Rate Last Admin   0.9 %  sodium chloride  infusion  500 mL Intravenous Once Rorik Vespa, Amber Bail, MD        Allergies as of 09/16/2023   (No Known Allergies)    Family History  Problem Relation Age of Onset   Hypertension Mother    Cancer Father        colon (60's)   Colon cancer Father    Cancer Sister        throat (smoker)   Esophageal cancer Sister    Hypertension Brother    Diabetes Brother    Hypertension Brother    Hypertension Brother    Cancer Brother        prostate   Hypertension Daughter    Hypertension Daughter    Kidney failure Son 55       on dialysis (pt unsure of cause)   Breast cancer Neg Hx    Heart disease Neg Hx    Colon polyps Neg Hx    Rectal cancer Neg Hx    Stomach cancer Neg Hx     Social History   Socioeconomic History   Marital status: Married    Spouse name: Not on file   Number of children: 4  Years of education: Not on file   Highest education level: Not on file  Occupational History   Occupation: MACHINE OPERATOR--Retired     Employer: [employer]  Tobacco Use   Smoking status: Former    Current packs/day: 0.00    Types: Cigarettes    Quit date: 05/13/2005    Years since quitting: 18.3   Smokeless tobacco: Never  Vaping Use   Vaping status: Never Used  Substance and Sexual Activity   Alcohol use: Never   Drug use: No   Sexual activity: Not Currently    Birth control/protection: Post-menopausal, Surgical  Other Topics Concern   Not on file  Social History Narrative   Divorced. Went back to her maiden name [name], previously [name]) in 2019. Lives alone.  Has 4 children, all in [place].  7 grandchildren, 4 great grandchildren   Retired 2011, but works part-time as a Teacher, English as a foreign language.         Reviewed 05/2023   Social Drivers of Health   Financial Resource Strain: Low Risk  (06/09/2023)   Overall Financial Resource Strain (CARDIA)    Difficulty of Paying Living Expenses: Not hard at all  Food Insecurity: No Food Insecurity (06/09/2023)   Hunger Vital Sign    Worried About Running Out of Food in the Last Year: Never true    Ran Out of Food in the Last Year: Never true  Transportation Needs: No Transportation Needs (06/09/2023)   PRAPARE - Administrator, Civil Service (Medical): No    Lack of Transportation (Non-Medical): No  Physical Activity: Sufficiently Active (06/09/2023)   Exercise Vital Sign    Days of Exercise per Week: 3 days    Minutes of Exercise per Session: 60 min  Stress: No Stress Concern Present (06/09/2023)   [employer] of Occupational Health - Occupational Stress Questionnaire    Feeling of Stress : Not at all  Social Connections: Moderately Integrated (11/11/2022)   Social Connection and Isolation Panel [NHANES]    Frequency of Communication with Friends and Family: Twice a week    Frequency of Social Gatherings with Friends and Family: Twice a week    Attends Religious Services: More than 4 times per year    Active Member of [employer] or Organizations: Yes    Attends Engineer, structural: More than 4 times per year    Marital Status: Divorced  Intimate Partner Violence: Not At Risk (06/09/2023)   Humiliation, Afraid, Rape, and Kick questionnaire    Fear of Current or Ex-Partner: No    Emotionally Abused: No    Physically Abused: No    Sexually Abused: No    Physical Exam: Vital signs in last 24 hours: @BP  (!) 148/82   Pulse (!) 58   Temp (!) 97.3 F (36.3 C) (Temporal)   Ht 5\' 3"  (1.6 m)   Wt 183 lb (83 kg)   SpO2 97%   BMI 32.42 kg/m  GEN: NAD EYE: Sclerae anicteric ENT: MMM CV: Non-tachycardic Pulm: CTA b/l GI: Soft, NT/ND NEURO:  Alert & Oriented x 3   Laurell Pond, MD [facility]  09/16/2023 9:03  AM

## 2023-09-16 NOTE — Progress Notes (Signed)
 Pt's states no medical or surgical changes since previsit or office visit.

## 2023-09-16 NOTE — Op Note (Signed)
 [facility] Patient Name: Sylvia Gutierrez Procedure Date: 09/16/2023 9:01 AM MRN: [id number] Endoscopist: Nannette Babe , MD, [id number] Age: 78 Referring MD:  Date of Birth: 04-22-1946 Gender: Female Account #: [id number] Procedure:                Colonoscopy Indications:              High risk colon cancer surveillance: Personal                            history of non-advanced adenoma, Family history of                            colon cancer in a first-degree relative (father),                            Last colonoscopy: October 2019 (TA x 2) Medicines:                Monitored Anesthesia Care Procedure:                Pre-Anesthesia Assessment:                           - Prior to the procedure, a History and Physical                            was performed, and patient medications and                            allergies were reviewed. The patient's tolerance of                            previous anesthesia was also reviewed. The risks                            and benefits of the procedure and the sedation                            options and risks were discussed with the patient.                            All questions were answered, and informed consent                            was obtained. Prior Anticoagulants: The patient has                            taken no anticoagulant or antiplatelet agents. ASA                            Grade Assessment: II - A patient with mild systemic                            disease. After reviewing the risks and benefits,  the patient was deemed in satisfactory condition to                            undergo the procedure.                           After obtaining informed consent, the colonoscope                            was passed under direct vision. Throughout the                            procedure, the patient's blood pressure, pulse, and                            oxygen saturations were  monitored continuously. The                            Olympus Scope SN: [id number] was introduced through                            the anus and advanced to the cecum, identified by                            appendiceal orifice and ileocecal valve. The                            colonoscopy was performed without difficulty. The                            patient tolerated the procedure well. The quality                            of the bowel preparation was good. The ileocecal                            valve, appendiceal orifice, and rectum were                            photographed. Scope In: 9:17:26 AM Scope Out: 9:38:35 AM Scope Withdrawal Time: 0 hours 17 minutes 41 seconds  Total Procedure Duration: 0 hours 21 minutes 9 seconds  Findings:                 The digital rectal exam was normal.                           A 6 mm polyp was found in the transverse colon. The                            polyp was sessile. The polyp was removed with a                            cold snare. Resection and retrieval were complete.  Localized mild mucosal changes characterized by                            nodularity and erythema were found in the sigmoid                            colon at a diverticulum. Biopsies were taken with a                            cold forceps for histology (to ensure no                            adenomatous change).                           Many medium-mouthed and small-mouthed diverticula                            were found from sigmoid to ascending colon.                           The retroflexed view of the distal rectum and anal                            verge was normal and showed no anal or rectal                            abnormalities. Complications:            No immediate complications. Estimated Blood Loss:     Estimated blood loss: none. Impression:               - One 6 mm polyp in the transverse colon, removed                             with a cold snare. Resected and retrieved.                           - Localized mild mucosal changes were found in the                            sigmoid colon at a diverticulum. Query mild                            inflammation. Biopsied to exclude adenomatous                            change.                           - Severe diverticulosis from sigmoid to ascending                            colon.                           -  The distal rectum and anal verge are normal on                            retroflexion view. Recommendation:           - Patient has a contact number available for                            emergencies. The signs and symptoms of potential                            delayed complications were discussed with the                            patient. Return to normal activities tomorrow.                            Written discharge instructions were provided to the                            patient.                           - Resume previous diet.                           - Continue present medications.                           - Await pathology results.                           - No recommendation at this time regarding repeat                            colonoscopy due to age at next surveillance                            interval. Nannette Babe, MD 09/16/2023 9:49:29 AM This report has been signed electronically.

## 2023-09-16 NOTE — Progress Notes (Signed)
 Vss nad trans to pacu

## 2023-09-17 ENCOUNTER — Telehealth: Payer: Self-pay

## 2023-09-17 NOTE — Telephone Encounter (Signed)
 Attempted to reach patient for follow up phone call. No answer, left voicemail for patient to call Dr. Marietta Shorter office with any questions or concerns.

## 2023-09-18 LAB — SURGICAL PATHOLOGY

## 2023-09-20 ENCOUNTER — Other Ambulatory Visit: Payer: Self-pay | Admitting: Family Medicine

## 2023-09-20 DIAGNOSIS — I10 Essential (primary) hypertension: Secondary | ICD-10-CM

## 2023-09-22 ENCOUNTER — Encounter: Payer: Self-pay | Admitting: Internal Medicine

## 2023-09-24 DIAGNOSIS — I10 Essential (primary) hypertension: Secondary | ICD-10-CM | POA: Diagnosis not present

## 2023-09-24 DIAGNOSIS — E785 Hyperlipidemia, unspecified: Secondary | ICD-10-CM | POA: Diagnosis not present

## 2023-10-24 DIAGNOSIS — I10 Essential (primary) hypertension: Secondary | ICD-10-CM | POA: Diagnosis not present

## 2023-10-24 DIAGNOSIS — E785 Hyperlipidemia, unspecified: Secondary | ICD-10-CM | POA: Diagnosis not present

## 2023-11-10 DIAGNOSIS — I10 Essential (primary) hypertension: Secondary | ICD-10-CM | POA: Diagnosis not present

## 2023-11-10 DIAGNOSIS — E785 Hyperlipidemia, unspecified: Secondary | ICD-10-CM | POA: Diagnosis not present

## 2023-11-21 DIAGNOSIS — M25552 Pain in left hip: Secondary | ICD-10-CM | POA: Diagnosis not present

## 2023-11-21 DIAGNOSIS — L700 Acne vulgaris: Secondary | ICD-10-CM | POA: Diagnosis not present

## 2023-11-21 DIAGNOSIS — M1712 Unilateral primary osteoarthritis, left knee: Secondary | ICD-10-CM | POA: Diagnosis not present

## 2023-11-21 DIAGNOSIS — G8929 Other chronic pain: Secondary | ICD-10-CM | POA: Diagnosis not present

## 2023-11-21 DIAGNOSIS — M25562 Pain in left knee: Secondary | ICD-10-CM | POA: Diagnosis not present

## 2023-11-23 DIAGNOSIS — E785 Hyperlipidemia, unspecified: Secondary | ICD-10-CM | POA: Diagnosis not present

## 2023-11-23 DIAGNOSIS — I10 Essential (primary) hypertension: Secondary | ICD-10-CM | POA: Diagnosis not present

## 2023-12-05 ENCOUNTER — Other Ambulatory Visit: Payer: Self-pay | Admitting: Family Medicine

## 2023-12-05 DIAGNOSIS — E7800 Pure hypercholesterolemia, unspecified: Secondary | ICD-10-CM

## 2023-12-06 NOTE — Progress Notes (Unsigned)
 No chief complaint on file.  Patient presents for 6 month f/u on chronic problems.  Hypertension follow-up:   In general she states tries to follow low sodium diet, doesn't add salt.   She no longer eats lunch meats, processed meats. She no longer eats [place] food/soy sauce. Today she reports she eats Lays potato chips 1-2x/week, and eating pickles. ***UPDATE Makes soups with low sodium broth, but with canned vegetables.   She is compliant with atenolol  25mg  and lisinopril  HCT 20-12.5.  Denies dizziness, headaches, chest pain ,edema. Denies side effects of medications. Denies any leg/muscle cramps.   BP's are running ***  BP Readings from Last 3 Encounters:  09/16/23 (!) 169/77  06/09/23 134/80  05/29/23 120/64      Hyperlipidemia follow-up: Patient is reportedly trying to follow a low-fat, low cholesterol diet. Compliant with medications (atorvastatin  80mg ) and denies medication side effects.  She is due for recheck.  Current diet includes using a liquid egg without cholesterol (in a carton, not egg-whites). Red meat 2x/month. Occasional butter. No cheese.  Rarely has milk (almond milk when she has it).   Lab Results  Component Value Date   CHOL 139 11/11/2022   HDL 57 11/11/2022   LDLCALC 69 11/11/2022   TRIG 66 11/11/2022   CHOLHDL 2.4 11/11/2022    Aortic atherosclerosis--noted on CT in 11/2019.  She is compliant with statin, and LDL was at goal on last check.   Obesity and pre-diabetes: When first diagnosed she was drinking grape juice, +sweets/cookies. She no longer drinks much juice (1/2 cup every other day of orange juice), and cut back on the sweets in her diet. She limits bread (honey wheat instead of white bread) to 1 slice.  She uses brown rice.  Last A1c was 5.5 in 05/2023 (down from 5.8% in 11/2022 and 6.1% in 04/2022).   3x/week she goes to the gym, uses the bicycle and elliptical (total of an hour of cardio). No weights. ***UPDATE   H/o adenomatous colon  polyps:  She had a colonoscopy in 09/2023, and a tubular adenoma was removed (prior colonoscopy was in 02/2018, had 2 tubular adenomas). Severe diverticulosis was noted. No further colonoscopy recommended based on age.    PMH, PSH, SH reviewed     ROS: No fever, chills, URI symptoms, headache, dizziness, chest pain, palpitations, syncope, edema.  No muscle cramps/spasms. No n/v/d, no urinary complaints. No bleeding, bruising, rash. Moods are good    PHYSICAL EXAM:  There were no vitals taken for this visit.  Wt Readings from Last 3 Encounters:  09/16/23 183 lb (83 kg)  06/09/23 181 lb 12.8 oz (82.5 kg)  05/29/23 183 lb (83 kg)    Well-appearing, pleasant female, in no distress. HEENT: conjunctiva and sclera are clear, EOMI Neck: no lymphadenopathy or mass Heart: regular rate and rhythm Lungs: clear bilaterally Extremities: no edema Neuro: alert and oriented, cranial nerves grossly intact, normal gait Psych: Normal mood, affect. Normal eye contact, speech, hygiene and grooming.   ASSESSMENT/PLAN:   ***REFILL LIPITOR AFTER LABS BACK  F/u in Feb for CPE as scheduled.

## 2023-12-08 ENCOUNTER — Ambulatory Visit (INDEPENDENT_AMBULATORY_CARE_PROVIDER_SITE_OTHER): Payer: Medicare Other | Admitting: Family Medicine

## 2023-12-08 ENCOUNTER — Encounter: Payer: Self-pay | Admitting: Family Medicine

## 2023-12-08 VITALS — BP 120/70 | HR 56 | Ht 62.0 in | Wt 186.0 lb

## 2023-12-08 DIAGNOSIS — E7800 Pure hypercholesterolemia, unspecified: Secondary | ICD-10-CM | POA: Diagnosis not present

## 2023-12-08 DIAGNOSIS — I700 Atherosclerosis of aorta: Secondary | ICD-10-CM

## 2023-12-08 DIAGNOSIS — R7301 Impaired fasting glucose: Secondary | ICD-10-CM

## 2023-12-08 DIAGNOSIS — I10 Essential (primary) hypertension: Secondary | ICD-10-CM | POA: Diagnosis not present

## 2023-12-08 LAB — POCT GLYCOSYLATED HEMOGLOBIN (HGB A1C): Hemoglobin A1C: 5.7 % — AB (ref 4.0–5.6)

## 2023-12-08 MED ORDER — ATENOLOL 25 MG PO TABS: ORAL_TABLET | ORAL | 0 refills | Status: AC

## 2023-12-08 MED ORDER — LISINOPRIL-HYDROCHLOROTHIAZIDE 20-12.5 MG PO TABS: 1.0000 | ORAL_TABLET | Freq: Every day | ORAL | 1 refills | Status: DC

## 2023-12-08 MED ORDER — ATORVASTATIN CALCIUM 80 MG PO TABS: ORAL_TABLET | ORAL | 1 refills | Status: DC

## 2023-12-08 NOTE — Patient Instructions (Addendum)
 Try and limit the red meat (for you, means burgers).  You can do more chicken on the grill, or consider trying [place] burger, or even veggie burgers or black bean burgers as meat-free options.  Your sugars are a little higher than last time, since you are having more bread and sweets. Continue to try and limit this.  Try and do some weights at the gym at least 2x/week. This helps your bones and muscles.  Remember to get your high dose flu shot and the updated COVID booster when it becomes available in the Fall (September/October).

## 2023-12-09 ENCOUNTER — Telehealth: Payer: Self-pay | Admitting: *Deleted

## 2023-12-09 ENCOUNTER — Ambulatory Visit: Payer: Self-pay | Admitting: Family Medicine

## 2023-12-09 LAB — LIPID PANEL
Chol/HDL Ratio: 3.3 ratio (ref 0.0–4.4)
Cholesterol, Total: 177 mg/dL (ref 100–199)
HDL: 53 mg/dL (ref >39)
LDL Chol Calc (NIH): 110 mg/dL — ABNORMAL HIGH (ref 0–99)
Triglycerides: 76 mg/dL (ref 0–149)
VLDL Cholesterol Cal: 14 mg/dL (ref 5–40)

## 2023-12-09 NOTE — Telephone Encounter (Signed)
 Copied from CRM [id number]. Topic: Clinical - Lab/Test Results >> Dec 09, 2023  9:27 AM Graeme ORN wrote: Reason for CRM: Patient called back. Returned missed call. Looks like for lab results. Read note as written by provider. Thank You  Noted.

## 2023-12-11 DIAGNOSIS — L700 Acne vulgaris: Secondary | ICD-10-CM | POA: Diagnosis not present

## 2023-12-23 DIAGNOSIS — I10 Essential (primary) hypertension: Secondary | ICD-10-CM | POA: Diagnosis not present

## 2023-12-23 DIAGNOSIS — E785 Hyperlipidemia, unspecified: Secondary | ICD-10-CM | POA: Diagnosis not present

## 2024-01-20 DIAGNOSIS — L905 Scar conditions and fibrosis of skin: Secondary | ICD-10-CM | POA: Diagnosis not present

## 2024-01-20 DIAGNOSIS — L728 Other follicular cysts of the skin and subcutaneous tissue: Secondary | ICD-10-CM | POA: Diagnosis not present

## 2024-01-22 ENCOUNTER — Other Ambulatory Visit: Payer: Self-pay | Admitting: Family Medicine

## 2024-01-22 DIAGNOSIS — I10 Essential (primary) hypertension: Secondary | ICD-10-CM

## 2024-01-23 ENCOUNTER — Telehealth: Payer: Self-pay | Admitting: Internal Medicine

## 2024-01-23 MED ORDER — MNEXSPIKE 10 MCG/0.2ML IM SUSY: 0.2000 mL | PREFILLED_SYRINGE | Freq: Once | INTRAMUSCULAR | 0 refills | Status: AC

## 2024-01-23 NOTE — Telephone Encounter (Signed)
 Sent order for covid to pharmacy and left message for pt that it was done   Copied from CRM #[id number]. Topic: Clinical - Medication Question >> Jan 23, 2024  9:54 AM Treva T wrote: Reason for CRM: Patient calling, states per provider last visit, she needs a COVID booster shot.  Patient reports would like to get this shot art her local pharmacy.  Can follow up with patient to discuss further, at [phone].  [id number] [address], [place] - [address] AT [address]  [place] [address] Phone: [phone] Fax: [phone]

## 2024-02-17 DIAGNOSIS — L2989 Other pruritus: Secondary | ICD-10-CM | POA: Diagnosis not present

## 2024-02-17 DIAGNOSIS — L538 Other specified erythematous conditions: Secondary | ICD-10-CM | POA: Diagnosis not present

## 2024-02-17 DIAGNOSIS — L720 Epidermal cyst: Secondary | ICD-10-CM | POA: Diagnosis not present

## 2024-02-17 DIAGNOSIS — L728 Other follicular cysts of the skin and subcutaneous tissue: Secondary | ICD-10-CM | POA: Diagnosis not present

## 2024-02-17 DIAGNOSIS — R208 Other disturbances of skin sensation: Secondary | ICD-10-CM | POA: Diagnosis not present

## 2024-02-24 DIAGNOSIS — H2513 Age-related nuclear cataract, bilateral: Secondary | ICD-10-CM | POA: Diagnosis not present

## 2024-02-24 DIAGNOSIS — H04123 Dry eye syndrome of bilateral lacrimal glands: Secondary | ICD-10-CM | POA: Diagnosis not present

## 2024-05-27 LAB — HM MAMMOGRAPHY

## 2024-05-28 ENCOUNTER — Telehealth: Payer: Self-pay | Admitting: Family Medicine

## 2024-05-28 NOTE — Telephone Encounter (Signed)
 Copied from CRM [id number]. Topic: General - Call Back - No Documentation >> May 27, 2024 10:17 AM Sylvia Gutierrez wrote: Reason for RMF:Ejupzwu stated she received a call from clinic but no notes in chart. Pt is wondering is it regarding her appt for her breasts. Her appointment is at 43, if so.    [phone] (M)  Returned pt's call and left message that we did not have a note of anyone calling her from our office

## 2024-06-04 ENCOUNTER — Other Ambulatory Visit: Payer: Self-pay | Admitting: Family Medicine

## 2024-06-04 DIAGNOSIS — E7800 Pure hypercholesterolemia, unspecified: Secondary | ICD-10-CM

## 2024-06-21 ENCOUNTER — Ambulatory Visit: Payer: Medicare Other | Admitting: Family Medicine

## 2024-07-06 ENCOUNTER — Encounter: Admitting: Medical
